# Patient Record
Sex: Male | Born: 1935 | Race: White | Hispanic: No | Marital: Married | Smoking: Never smoker
Health system: Southern US, Community
[De-identification: ages and names within clinical notes are randomized; demographics above are authoritative.]

## PROBLEM LIST (undated history)

## (undated) DIAGNOSIS — E785 Hyperlipidemia, unspecified: Secondary | ICD-10-CM

## (undated) DIAGNOSIS — I2109 ST elevation (STEMI) myocardial infarction involving other coronary artery of anterior wall: Secondary | ICD-10-CM

## (undated) DIAGNOSIS — E669 Obesity, unspecified: Secondary | ICD-10-CM

## (undated) DIAGNOSIS — G4733 Obstructive sleep apnea (adult) (pediatric): Secondary | ICD-10-CM

## (undated) DIAGNOSIS — E7800 Pure hypercholesterolemia, unspecified: Secondary | ICD-10-CM

## (undated) DIAGNOSIS — I10 Essential (primary) hypertension: Secondary | ICD-10-CM

## (undated) DIAGNOSIS — I2510 Atherosclerotic heart disease of native coronary artery without angina pectoris: Secondary | ICD-10-CM

## (undated) DIAGNOSIS — I872 Venous insufficiency (chronic) (peripheral): Secondary | ICD-10-CM

## (undated) DIAGNOSIS — IMO0001 Reserved for inherently not codable concepts without codable children: Secondary | ICD-10-CM

## (undated) DIAGNOSIS — D496 Neoplasm of unspecified behavior of brain: Secondary | ICD-10-CM

## (undated) DIAGNOSIS — I252 Old myocardial infarction: Secondary | ICD-10-CM

## (undated) DIAGNOSIS — L97919 Non-pressure chronic ulcer of unspecified part of right lower leg with unspecified severity: Secondary | ICD-10-CM

## (undated) DIAGNOSIS — D649 Anemia, unspecified: Secondary | ICD-10-CM

## (undated) DIAGNOSIS — IMO0002 Reserved for concepts with insufficient information to code with codable children: Secondary | ICD-10-CM

## (undated) HISTORY — DX: Neoplasm of unspecified behavior of brain: D49.6

## (undated) HISTORY — PX: TONSILLECTOMY AND ADENOIDECTOMY: SUR1326

## (undated) HISTORY — DX: Anemia, unspecified: D64.9

## (undated) HISTORY — DX: Reserved for concepts with insufficient information to code with codable children: IMO0002

## (undated) HISTORY — DX: Essential (primary) hypertension: I10

## (undated) HISTORY — DX: Atherosclerotic heart disease of native coronary artery without angina pectoris: I25.10

## (undated) HISTORY — DX: Non-pressure chronic ulcer of unspecified part of right lower leg with unspecified severity: L97.919

## (undated) HISTORY — DX: Reserved for inherently not codable concepts without codable children: IMO0001

## (undated) HISTORY — DX: Venous insufficiency (chronic) (peripheral): I87.2

## (undated) HISTORY — DX: Old myocardial infarction: I25.2

## (undated) HISTORY — DX: Obstructive sleep apnea (adult) (pediatric): G47.33

## (undated) HISTORY — DX: Obesity, unspecified: E66.9

## (undated) HISTORY — DX: ST elevation (STEMI) myocardial infarction involving other coronary artery of anterior wall: I21.09

## (undated) HISTORY — DX: Hyperlipidemia, unspecified: E78.5

## (undated) HISTORY — DX: Pure hypercholesterolemia, unspecified: E78.00

---

## 1988-12-05 DIAGNOSIS — I2109 ST elevation (STEMI) myocardial infarction involving other coronary artery of anterior wall: Secondary | ICD-10-CM

## 1988-12-05 HISTORY — DX: ST elevation (STEMI) myocardial infarction involving other coronary artery of anterior wall: I21.09

## 1998-11-06 ENCOUNTER — Ambulatory Visit: Admission: RE | Admit: 1998-11-06 | Discharge: 1998-11-06 | Payer: Self-pay | Admitting: Cardiology

## 2002-04-24 ENCOUNTER — Encounter: Admission: RE | Admit: 2002-04-24 | Discharge: 2002-04-24 | Payer: Self-pay | Admitting: Internal Medicine

## 2002-04-24 ENCOUNTER — Encounter: Payer: Self-pay | Admitting: Internal Medicine

## 2003-01-05 HISTORY — PX: CORONARY ARTERY BYPASS GRAFT: SHX141

## 2003-01-21 ENCOUNTER — Ambulatory Visit (HOSPITAL_COMMUNITY): Admission: RE | Admit: 2003-01-21 | Discharge: 2003-01-21 | Payer: Self-pay | Admitting: Cardiology

## 2003-01-27 ENCOUNTER — Encounter: Payer: Self-pay | Admitting: Thoracic Surgery (Cardiothoracic Vascular Surgery)

## 2003-01-30 ENCOUNTER — Encounter: Payer: Self-pay | Admitting: Thoracic Surgery (Cardiothoracic Vascular Surgery)

## 2003-01-30 ENCOUNTER — Inpatient Hospital Stay (HOSPITAL_COMMUNITY)
Admission: RE | Admit: 2003-01-30 | Discharge: 2003-02-04 | Payer: Self-pay | Admitting: Thoracic Surgery (Cardiothoracic Vascular Surgery)

## 2003-01-31 ENCOUNTER — Encounter: Payer: Self-pay | Admitting: Thoracic Surgery (Cardiothoracic Vascular Surgery)

## 2003-02-01 ENCOUNTER — Encounter: Payer: Self-pay | Admitting: Thoracic Surgery (Cardiothoracic Vascular Surgery)

## 2003-03-03 ENCOUNTER — Encounter (HOSPITAL_COMMUNITY): Admission: RE | Admit: 2003-03-03 | Discharge: 2003-06-01 | Payer: Self-pay | Admitting: Cardiology

## 2003-05-22 ENCOUNTER — Inpatient Hospital Stay (HOSPITAL_COMMUNITY): Admission: EM | Admit: 2003-05-22 | Discharge: 2003-05-25 | Payer: Self-pay | Admitting: Emergency Medicine

## 2003-05-22 ENCOUNTER — Encounter: Payer: Self-pay | Admitting: Emergency Medicine

## 2003-06-05 HISTORY — PX: CORONARY ANGIOPLASTY: SHX604

## 2003-06-16 ENCOUNTER — Encounter (HOSPITAL_COMMUNITY): Admission: RE | Admit: 2003-06-16 | Discharge: 2003-09-14 | Payer: Self-pay | Admitting: Cardiology

## 2003-06-28 ENCOUNTER — Inpatient Hospital Stay (HOSPITAL_COMMUNITY): Admission: EM | Admit: 2003-06-28 | Discharge: 2003-07-01 | Payer: Self-pay

## 2003-06-30 HISTORY — PX: CARDIAC CATHETERIZATION: SHX172

## 2003-07-15 ENCOUNTER — Ambulatory Visit (HOSPITAL_COMMUNITY): Admission: RE | Admit: 2003-07-15 | Discharge: 2003-07-16 | Payer: Self-pay | Admitting: Cardiology

## 2003-12-25 ENCOUNTER — Ambulatory Visit (HOSPITAL_COMMUNITY): Admission: RE | Admit: 2003-12-25 | Discharge: 2003-12-25 | Payer: Self-pay | Admitting: Gastroenterology

## 2008-05-01 ENCOUNTER — Encounter: Admission: RE | Admit: 2008-05-01 | Discharge: 2008-05-01 | Payer: Self-pay | Admitting: Internal Medicine

## 2009-01-23 HISTORY — PX: CARDIOVASCULAR STRESS TEST: SHX262

## 2010-07-26 ENCOUNTER — Ambulatory Visit: Payer: Self-pay | Admitting: Cardiology

## 2010-12-15 ENCOUNTER — Encounter: Payer: Self-pay | Admitting: Gastroenterology

## 2011-01-03 ENCOUNTER — Encounter (INDEPENDENT_AMBULATORY_CARE_PROVIDER_SITE_OTHER): Payer: Self-pay | Admitting: *Deleted

## 2011-01-06 NOTE — Letter (Signed)
Summary: Colonoscopy Letter  [facility]  [address] [place], [place] [address]   Phone: [phone]  Fax: [phone]      December 15, 2010 MRN: [id number]   [name] [address] [place], [place]  [address]   Dear [name],   According to your medical record, it is time for you to schedule a Colonoscopy. The American Cancer Society recommends this procedure as a method to detect early colon cancer. Patients with a family history of colon cancer, or a personal history of colon polyps or inflammatory bowel disease are at increased risk.  This letter has been generated based on the recommendations made at the time of your procedure. If you feel that in your particular situation this may no longer apply, please contact our office.  Please call our office at [phone] to schedule this appointment or to update your records at your earliest convenience.  Thank you for cooperating with [place] to provide you with the very best care possible.   Sincerely,  Judie Petit T. Russella Dar, M.D.  [facility] Gastroenterology Division [phone]

## 2011-01-12 NOTE — Letter (Signed)
Summary: Pre Visit Letter Revised  [facility]  [address] [place], [place] [address]   Phone: [phone]  Fax: [phone]        01/03/2011 MRN: [id number] [name] [address] [place], [place]  [address]             Procedure Date: February 07, 2011   recall col-Dr Annamarie Major to the Gastroenterology Division at [facility].    You are scheduled to see a nurse for your pre-procedure visit on January 24, 2011 at 9:00am on the 3rd floor at [facility], [address]  We ask that you try to arrive at our office 15 minutes prior to your appointment time to allow for check-in.  Please take a minute to review the attached form.  If you answer "Yes" to one or more of the questions on the first page, we ask that you call the person listed at your earliest opportunity.  If you answer "No" to all of the questions, please complete the rest of the form and bring it to your appointment.    Your nurse visit will consist of discussing your medical and surgical history, your immediate family medical history, and your medications.   If you are unable to list all of your medications on the form, please bring the medication bottles to your appointment and we will list them.  We will need to be aware of both prescribed and over the counter drugs.  We will need to know exact dosage information as well.    Please be prepared to read and sign documents such as consent forms, a financial agreement, and acknowledgement forms.  If necessary, and with your consent, a friend or relative is welcome to sit-in on the nurse visit with you.  Please bring your insurance card so that we may make a copy of it.  If your insurance requires a referral to see a specialist, please bring your referral form from your primary care physician.  No co-pay is required for this nurse visit.     If you cannot keep your appointment, please call [phone] to cancel or reschedule prior to  your appointment date.  This allows [place] the opportunity to schedule an appointment for another patient in need of care.    Thank you for choosing [facility] for your medical needs.  We appreciate the opportunity to care for you.  Please visit [place] at our website  to learn more about our practice.  Sincerely, The Gastroenterology Division

## 2011-01-20 ENCOUNTER — Encounter (INDEPENDENT_AMBULATORY_CARE_PROVIDER_SITE_OTHER): Payer: Self-pay | Admitting: *Deleted

## 2011-01-24 ENCOUNTER — Encounter: Payer: Self-pay | Admitting: Gastroenterology

## 2011-01-28 ENCOUNTER — Ambulatory Visit (INDEPENDENT_AMBULATORY_CARE_PROVIDER_SITE_OTHER): Payer: Medicare Other | Admitting: Cardiology

## 2011-01-28 DIAGNOSIS — E7800 Pure hypercholesterolemia, unspecified: Secondary | ICD-10-CM

## 2011-01-28 DIAGNOSIS — I252 Old myocardial infarction: Secondary | ICD-10-CM

## 2011-01-28 DIAGNOSIS — I119 Hypertensive heart disease without heart failure: Secondary | ICD-10-CM

## 2011-01-28 DIAGNOSIS — I2510 Atherosclerotic heart disease of native coronary artery without angina pectoris: Secondary | ICD-10-CM

## 2011-02-01 NOTE — Miscellaneous (Signed)
Summary: [facility] PV  Clinical Lists Changes  Medications: Added new medication of MOVIPREP 100 GM  SOLR (PEG-KCL-NACL-NASULF-NA ASC-C) As per prep instructions. - Signed Rx of MOVIPREP 100 GM  SOLR (PEG-KCL-NACL-NASULF-NA ASC-C) As per prep instructions.;  #1 x 0;  Signed;  Entered by: Ezra Sites RN;  Authorized by: Meryl Dare MD [facility];  Method used: Electronically to [facility] [address], [place], [place]  [address], Ph: [phone], Fax: [phone] Observations: Added new observation of NKA: T (01/24/2011 8:44)    Prescriptions: MOVIPREP 100 GM  SOLR (PEG-KCL-NACL-NASULF-NA ASC-C) As per prep instructions.  #1 x 0   Entered by:   Ezra Sites RN   Authorized by:   Meryl Dare MD [facility]   Signed by:   Ezra Sites RN on 01/24/2011   Method used:   Electronically to        [facility] (retail)       [address]       [place], [place]  [address]       Ph: [phone]       Fax: [phone]   RxID:   [id number]

## 2011-02-01 NOTE — Letter (Signed)
Summary: Moviprep Instructions  [facility]  [address]   [place], [place] [address]   Phone: [phone]  Fax: [phone]       [name]    07-28-1935    MRN: [id number]        Procedure Day Dorna Bloom: Monday, 02-07-11     Arrival Time: 9:30 a.m.     Procedure Time: 10:30 a.m.     Location of Procedure:                    x  [facility] (4th Floor)   PREPARATION FOR COLONOSCOPY WITH MOVIPREP   Starting 5 days prior to your procedure 02-02-11 do not eat nuts, seeds, popcorn, corn, beans, peas,  salads, or any raw vegetables.  Do not take any fiber supplements (e.g. Metamucil, Citrucel, and Benefiber).  THE DAY BEFORE YOUR PROCEDURE         DATE: 02-06-11  DAY: Sunday  1.  Drink clear liquids the entire day-NO SOLID FOOD  2.  Do not drink anything colored red or purple.  Avoid juices with pulp.  No orange juice.  3.  Drink at least 64 oz. (8 glasses) of fluid/clear liquids during the day to prevent dehydration and help the prep work efficiently.  CLEAR LIQUIDS INCLUDE: Water Jello Ice Popsicles Tea (sugar ok, no milk/cream) Powdered fruit flavored drinks Coffee (sugar ok, no milk/cream) Gatorade Juice: apple, white grape, white cranberry  Lemonade Clear bullion, consomm, broth Carbonated beverages (any kind) Strained chicken noodle soup Hard Candy                             4.  In the morning, mix first dose of MoviPrep solution:    Empty 1 Pouch A and 1 Pouch B into the disposable container    Add lukewarm drinking water to the top line of the container. Mix to dissolve    Refrigerate (mixed solution should be used within 24 hrs)  5.  Begin drinking the prep at 5:00 p.m. The MoviPrep container is divided by 4 marks.   Every 15 minutes drink the solution down to the next mark (approximately 8 oz) until the full liter is complete.   6.  Follow completed prep with 16 oz of clear liquid of your choice (Nothing red or purple).   Continue to drink clear liquids until bedtime.  7.  Before going to bed, mix second dose of MoviPrep solution:    Empty 1 Pouch A and 1 Pouch B into the disposable container    Add lukewarm drinking water to the top line of the container. Mix to dissolve    Refrigerate  THE DAY OF YOUR PROCEDURE      DATE: 02-07-11 DAY: Monday  Beginning at 5:30 a.m. (5 hours before procedure):         1. Every 15 minutes, drink the solution down to the next mark (approx 8 oz) until the full liter is complete.  2. Follow completed prep with 16 oz. of clear liquid of your choice.    3. You may drink clear liquids until 8:30 a.m. (2 HOURS BEFORE PROCEDURE).   MEDICATION INSTRUCTIONS  Unless otherwise instructed, you should take regular prescription medications with a small sip of water   as early as possible the morning of your procedure.           OTHER INSTRUCTIONS  You will need a responsible adult at  least 74 years of age to accompany you and drive you home.   This person must remain in the waiting room during your procedure.  Wear loose fitting clothing that is easily removed.  Leave jewelry and other valuables at home.  However, you may wish to bring a book to read or  an iPod/MP3 player to listen to music as you wait for your procedure to start.  Remove all body piercing jewelry and leave at home.  Total time from sign-in until discharge is approximately 2-3 hours.  You should go home directly after your procedure and rest.  You can resume normal activities the  day after your procedure.  The day of your procedure you should not:   Drive   Make legal decisions   Operate machinery   Drink alcohol   Return to work  You will receive specific instructions about eating, activities and medications before you leave.    The above instructions have been reviewed and explained to me by   Ezra Sites RN  January 24, 2011 9:27 AM     I fully understand and can verbalize  these instructions _____________________________ Date _________

## 2011-02-07 ENCOUNTER — Other Ambulatory Visit (AMBULATORY_SURGERY_CENTER): Payer: Medicare Other | Admitting: Gastroenterology

## 2011-02-07 ENCOUNTER — Other Ambulatory Visit: Payer: Self-pay | Admitting: Gastroenterology

## 2011-02-07 DIAGNOSIS — D128 Benign neoplasm of rectum: Secondary | ICD-10-CM

## 2011-02-07 DIAGNOSIS — K648 Other hemorrhoids: Secondary | ICD-10-CM

## 2011-02-07 DIAGNOSIS — K5730 Diverticulosis of large intestine without perforation or abscess without bleeding: Secondary | ICD-10-CM

## 2011-02-07 DIAGNOSIS — Z1211 Encounter for screening for malignant neoplasm of colon: Secondary | ICD-10-CM

## 2011-02-07 DIAGNOSIS — D129 Benign neoplasm of anus and anal canal: Secondary | ICD-10-CM

## 2011-02-07 DIAGNOSIS — K620 Anal polyp: Secondary | ICD-10-CM

## 2011-02-10 ENCOUNTER — Encounter: Payer: Self-pay | Admitting: Gastroenterology

## 2011-02-15 NOTE — Procedures (Addendum)
Summary: Colonoscopy  Patient: [name] Note: All result statuses are Final unless otherwise noted.  Tests: (1) Colonoscopy (COL)   COL Colonoscopy           DONE     [facility]     [address]     [place], [place]  [address]          COLONOSCOPY PROCEDURE REPORT     PATIENT:  Todd, [name]  MR#:  [id number]     BIRTHDATE:  1935/01/22, 75 yrs. old  GENDER:  male     ENDOSCOPIST:  Judie Petit T. Russella Dar, MD, [facility]          PROCEDURE DATE:  02/07/2011     PROCEDURE:  Colonoscopy with biopsy     ASA CLASS:  Class II     INDICATIONS:  1) Routine Risk Screening     MEDICATIONS:   Fentanyl 50 mcg IV, Versed 4 mg IV     DESCRIPTION OF PROCEDURE:   After the risks benefits and     alternatives of the procedure were thoroughly explained, informed     consent was obtained.  Digital rectal exam was performed and     revealed no abnormalities.   The LB PCF-H180AL [id number] endoscope     was introduced through the anus and advanced to the cecum, which     was identified by both the appendix and ileocecal valve, without     limitations.  The quality of the prep was good, using MoviPrep.     The instrument was then slowly withdrawn as the colon was fully     examined.     <<PROCEDUREIMAGES>>     FINDINGS:  A sessile polyp was found in the rectum. It was 4 mm in     size. The polyp was removed using cold biopsy forceps. Moderate     diverticulosis was found in the sigmoid colon.  A normal appearing     cecum, ileocecal valve, and appendiceal orifice were identified.     The ascending, hepatic flexure, transverse, splenic flexure,     descending appeared unremarkable. Retroflexed views in the rectum     revealed internal hemorrhoids, small.  The time to cecum =  3.25     minutes. The scope was then withdrawn (time =  10.25  min) from the     patient and the procedure completed.          COMPLICATIONS:  None          ENDOSCOPIC IMPRESSION:     1) 4 mm sessile polyp in the  rectum     2) Moderate diverticulosis in the sigmoid colon     3) Internal hemorrhoids          RECOMMENDATIONS:     1) Await pathology results     2) High fiber diet with liberal fluid intake.     3) If the polyp is adenomatous (pre-cancerous), colonoscopy in 5     years. Otherwise follow colorectal cancer screening guidelines for     "routine risk" patients with colonoscopy in 10 years.          Venita Lick. Russella Dar, MD, [facility]          CC:  Rodrigo Ran, MD          n.     Rosalie DoctorVenita Lick. Enna Warwick at 02/07/2011 11:08 AM          Nehemiah Massed, [phone]  Note:  An exclamation mark (!) indicates a result that was not dispersed into the flowsheet. Document Creation Date: 02/07/2011 11:08 AM _______________________________________________________________________  (1) Order result status: Final Collection or observation date-time: 02/07/2011 11:04 Requested date-time:  Receipt date-time:  Reported date-time:  Referring Physician:   Ordering Physician: Claudette Head [phone]) Specimen Source:  Source: Launa Grill Order Number: [id number] Lab site:   Appended Document: Colonoscopy     Procedures Next Due Date:    Colonoscopy: 02/2021

## 2011-02-22 NOTE — Letter (Signed)
Summary: Patient Notice- Colon Biospy Results   Gastroenterology  [address] [place], [place] [address]   Phone: [phone]  Fax: [phone]        February 10, 2011 MRN: [id number]    [name] [address] [place], [place]  [address]    Dear [name],  I am pleased to inform you that the biopsies taken during your recent colonoscopy did not show any evidence of cancer upon pathologic examination. The biopsies showed normal lymphoid tissue. There was no polyp.  Continue with the treatment plan as outlined on the day of your      exam.  You should have a repeat colonoscopy examination in 10 years.  Please call us if you are having persistent problems or have questions about your condition that have not been fully answered at this time.  Sincerely,  Meryl Dare MD [facility]  This letter has been electronically signed by your physician.  Appended Document: Patient Notice- Colon Biospy Results letter mailed

## 2011-04-22 NOTE — Op Note (Signed)
[name], [name] NO.:  [id number]   MEDICAL RECORD NO.:  [id number]                   PATIENT TYPE:  INP   LOCATION:  2304                                 FACILITY:  [facility]   PHYSICIAN:  Salvatore Decent. Dorris Fetch, M.D.         DATE OF BIRTH:  1935-03-30   DATE OF PROCEDURE:  01/30/2003  DATE OF DISCHARGE:                                 OPERATIVE REPORT   PREOPERATIVE DIAGNOSIS:  Severe three-vessel coronary artery disease.   POSTOPERATIVE DIAGNOSIS:  Severe three-vessel coronary artery disease.   PROCEDURE:  1. Median sternotomy.  2. Extracorporeal circulation.  3. Coronary artery bypass grafting x6 (LIMA to LAD, left radial artery to OM-     1, sequential saphenous vein graft to diagonal 1 and 2, sequential     saphenous vein grafts to posterior descending and posterolateral).  4. Endoscopic vein harvest at left thigh.   SURGEON:  Salvatore Decent. Dorris Fetch, M.D.   ASSISTANT:  Maple Mirza, P.A.   ANESTHESIA:  General.   FINDINGS:  Vein of fair quality with mild varicosities, large caliber,  mainly in radial, excellent quality, good targets.  OM-2 small, diffusely  diseased, and not graftable.   CLINICAL NOTE:  [name] is a 76 year old gentleman who underwent cardiac  catheterization by Dr. Peter Swaziland.  He was found to have severe three-  vessel coronary disease and was referred for consideration for coronary  artery bypass grafting.  The indications, risks, benefits, and alternative  treatments were discussed in detail with the patient preoperatively.  He  understood and accepted the risks of surgery and agreed to proceed.  Please  refer to the dictated history and physical for details of the patient's  history.   OPERATIVE NOTE:  [name] was brought to the preop holding area on  01/30/2003.  Lines were placed to monitor arterial, central venous and  pulmonary arterial pressure.  EKG leads were placed for venous telemetry.  Intravenous antibiotics were administered.  The patient was taken to the  operating room, anesthetized and intubated.  A Foley catheter was placed.  The left arm, chest, abdomen, and legs were prepped and draped in the usual  sterile fashion.   The left radial artery was harvested.  An incision was made on the volar  aspect overlying the radial pulse approximately 10 cm in length initially.  The distal end of the radial artery was dissected free using a harmonic  scalpel.  Large branches were clipped and divided with scissors.  After  freeing up the radial artery, a soft bulldog clamp was placed across the  radial artery.  There was a good pulse palpable distal to the bulldog clamp.  An incision was made, and we then proceeded with harvest.  The incision was  extended to just below the elbow crease.  The radial artery then was  harvested using the harmonic scalpel.  The radial artery was of big caliber.  When it was divided distally, there was excellent flow through the vessel.  It was 2.5 mm in diameter.  The vessel was irrigated with papaverine.  All  side branches were clipped.  The artery then was divided proximally.  Both  the proximal and distal stumps were suture ligated with a 4-0 Prolene  suture.  The wound then was irrigated and was closed in two layers with a  running 2-0 Vicryl subcutaneous suture and a running 3-0 Vicryl subcuticular  suture.  Simultaneously with this, an incision was made in the medial aspect  of the left leg at the level of the knee and the greater saphenous vein was  harvested from the left thigh using endoscopic technique.  The saphenous  vein was of fair quality.  It did have varicosities but was of large caliber  and was adequate for grafting.   The median sternotomy was performed, and the left internal mammary artery  was harvested in the standard fashion.  Branches were doubly clipped and  divided with scissors.  The mammary was a large-caliber,  good-quality  vessel.  The patient was fully heparinized, vessel divided distally; and in  the mammary artery, there was excellent flow through the cut end of the  vessel.   The pericardium was opened.  The ascending aorta was inspected and palpated.  There was no palpable atherosclerotic disease.  The aorta was cannulated  with a 24-French aortic cannula.  Via concentric #2 Ethibond pursestring  sutures, a dual-stage venous cannula was placed via pursestring suture in  the right atrial appendage.  Cardiopulmonary bypass was instituted, and the  patient was cooled to 32 degrees Celsius.  The coronary arteries were  inspected, and the anastomotic sites were chosen.  The conduits were  inspected and cut to length.  The radial artery was not of sufficient length  to graft the posterior descending and posterior lateral branches  sequentially.  Therefore, vein was used for this; and the radial artery was  used for OM-1.  OM-2 was non-graftable.  A foam pad was placed in the  pericardium to insulate the heart and protect the left phrenic nerve.  A  temperature probe was placed in the myocardial septum, and a cardioplegia  catheter was placed in the ascending aorta.   The aorta was cross clamped.  The left ventricle was entered via the aortic  root vent, and cardiac arrest was achieved with a combination of cold  antegrade blood cardioplegia and topical ice saline.  After achieving a  complete diastolic arrest and myocardial septal temperature of 11 degrees  Celsius, the following distal anastomoses were performed.   First, a reversed saphenous vein graft was placed sequentially to the  posterior descending and posterior lateral branches of the right coronary  artery.  This vein was of fair quality.  The posterior descending and  posterior lateral were both good-quality targets.  The PDA was 1.5 and the  PL was 2 mm in diameter.  A side-to-side anastomosis was performed at the posterior  descending and end-to-side at the posterior lateral.  Both were  performed with running 7-0 Prolene sutures.  There was excellent flow  through the graft.  Cardioplegia was administered.  There was good  hemostasis.  Septal re-warming occurred during this anastomosis.  Additional  cardioplegia was administered via the aortic root as well.   Next, a reversed saphenous vein graft was placed sequentially to the first  and second branches of the large diagonal.  The vein  again was of large  caliber and fair quality.  Both diagonal vessels were 1.5 mm in diameter.  A  side-to-side anastomosis was performed to the first diagonal and an end to  side to the second diagonal.  Both were performed with running 7-0 Prolene  sutures.  Both anastomoses probed easily proximally and distally with a 1.5-  mm probe placed in the anastomosis.  Again, there was excellent flow through  this graft.  Cardioplegia was administered through the vein grafts.   Next, the left radial artery was spatulated at its distal end and  anastomosed end to side to the first obtuse marginal vein to the left  circumflex.  This was a 1.5-mm, good-quality target.  The radial artery was  of good quality.  The anastomosis was performed with a running 8-0 Prolene  suture.  There was excellent flow through the graft.  Cardioplegia was  administered down the aortic root.  There was good back bleeding from the  radial artery.   Next, the left internal mammary artery was brought through a window in the  pericardium.  The distal end of the mammary was spatulated.  It was  anastomosed end to side to the distal LAD.  Both the LAD and the mammary  were 2-mm, good-quality vessels.  The anastomosis was performed end to side  with a running 8-0 Prolene suture.  At the mammary to LAD anastomosis, the  bulldog clamp was briefly removed to inspect for hemostasis.  There was an  excellent flash of blood distally.  Septal re-warming was noted.   The  bulldog clamp was re-placed.  The mammary pedicle was tacked at the  epicardial surface of the heart with 6-0 Prolene sutures.  Additional  cardioplegia was administered, and the vein grafts were cut to length.   The cardioplegia cannula was removed from the ascending aorta.  While under  cross clamp, the proximal anastomoses were performed.  The radial proximal  was performed to a 4-mm punch aortotomy with a running 7-0 Prolene suture.  The vein graft proximal anastomoses were performed with 4.4-mm punch  aortotomies with running 6-0 Prolene sutures.  At the completion of the  final proximal anastomosis, the patient was placed in the Trendelenburg  position.  The bulldog clamp was again removed from the mammary artery.  Immediate septal re-warming was noted.  Lidocaine was administered.  The  aortic root was de-aired.  The aortic cross clamp was removed.  The total  cross-clamp time was 107 minutes.  The patient required a single defibrillation with 20 joules.  He then was in  sinus rhythm thereafter.  The proximal and distal anastomoses were inspected  for hemostasis.  Epicardial pacing wires were placed on the right ventricle  and right atrium.  When the patient had been re-warmed to a core temperature  of 37 degrees Celsius, he was weaned from cardiopulmonary bypass without  difficulty.  The lungs were initially inflated, and the heart was allowed to  gradually fill with blood as the pump flows were decreased; and as stated,  the patient weaned without difficulty.  His initial cardiac index was  approximately 1.5 liters per minute per meter squared.  With volume  administration, it quickly rose to greater than 2 liters per minute per  meter squared, and the patient remained hemodynamically stable throughout  the post-bypass period.   A test dose of protamine was administered and was well tolerated.  The  atrial and aortic cannulae were removed.  The  remainder of the protamine  was  administered without incident.  The chest was irrigated with one liter of  warm normal saline containing 1 gram of vancomycin.  Hemostasis was  achieved.  A left pleural and two mediastinal chest tubes were placed  through separate subcostal incisions.  The pericardium was reapproximated  with interrupted 3-0 silk sutures and came together easily without tension.  The sternum was closed with heavy-gauge stainless steel wires.  The  pectoralis fascia was closed with a running #1 Vicryl suture.  The  subcutaneous tissue and skin were closed in standard fashion.  All sponge,  needle and instrument counts were correct at the end of the procedure.  There were no intraoperative complications.  The patient was taken from the  operating room to the Surgical Intensive Care Unit intubated and in critical  but stable condition.                                                 Salvatore Decent Dorris Fetch, M.D.    SCH/MEDQ  D:  01/30/2003  T:  01/31/2003  Job:  [id number]   cc:   Peter M. Swaziland, M.D.  [address]  [place], [place] [address]  Fax: [phone]

## 2011-04-22 NOTE — Cardiovascular Report (Signed)
NAME:  MALEK, SKOG NO.:  [id number]   MEDICAL RECORD NO.:  [id number]                   PATIENT TYPE:  OIB   LOCATION:  2852                                 FACILITY:  [facility]   PHYSICIAN:  Peter M. Swaziland, M.D.               DATE OF BIRTH:  1935/07/18   DATE OF PROCEDURE:  01/21/2003  DATE OF DISCHARGE:                              CARDIAC CATHETERIZATION   INDICATIONS FOR PROCEDURE:  The patient is a 76 year old white male, status  post remote anterior myocardial infarction in 1990 and subsequent  angioplasty of the LAD, who presents now with increasing anginal symptoms.  The patient has a history of hyperlipidemia and hypertension.   ACCESS:  Via the right femoral artery using the standard Seldinger  technique.   EQUIPMENT:  The 6 French 4 cm right and left Judkins catheter, 6 French  pigtail catheter, 6 French arterial sheath.   MEDICATIONS:  Local anesthesia with 1% Xylocaine.   CONTRAST:  Omnipaque 150 mL.   HEMODYNAMIC DATA:  Aortic pressure was 100/64 with a mean of 80 mmHg.  Left  ventricular pressure is 107 with an EDP of 23 mmHg.   ANGIOGRAPHIC DATA:  1. Left coronary artery:  The left coronary artery arises and distributes     normally.  2. Left main:  The left main coronary artery is ectatic with mild narrowing     up to 20% distally.  3. Left anterior descending:  The left anterior descending artery is a large     vessel.  At the diagonal bifurcation there is a 70-80% stenosis in the     LAD.  This is followed by sequential 90% stenoses in the mid LAD.  The     far distal LAD at the apex is diffusely diseased to 90%.  The first     diagonal branch is a large vessel with 90% ostial stenosis.  4. Left circumflex:  The left circumflex coronary artery has a 99% stenosis     in the mid vessel prior to a very small second marginal branch.  The     first marginal branch is without significant disease.  5. Right coronary artery:  The  right coronary artery is a large dominant     vessel. It is also ectatic.  There is a very long segmental stenosis in     the mid vessel to 70%.  This covers approximately 30 mm of vessel. There     is also a 90% stenosis in a large posterolateral branch.   LEFT VENTRICULAR ANGIOGRAPHY:  The left ventricular angiography is performed  in the RAO and LAO cranial views.  This demonstrates akinesia of the mid to  distal septum and apex.  Overall, left ventricular systolic function is  mildly impaired with ejection fraction estimated at 45%.  There is no mitral  regurgitation or prolapse.  The aortic valve appears normal.  FINAL INTERPRETATION:  1. Severe three-vessel obstructive atherosclerotic coronary artery disease.  2. Mild left ventricular dysfunction.   PLAN:  Would recommend coronary bypass surgery.                                                   Peter M. Swaziland, M.D.    PMJ/MEDQ  D:  01/21/2003  T:  01/21/2003  Job:  [id number]   cc:   Loraine Leriche A. Waynard Edwards, M.D.  [address]  [place]  [place] [address]  Fax: [phone]   CVTS

## 2011-04-22 NOTE — Discharge Summary (Signed)
NAMESOLAN, VOSLER NO.:  [id number]   MEDICAL RECORD NO.:  [id number]                   PATIENT TYPE:  INP   LOCATION:  2018                                 FACILITY:  [facility]   PHYSICIAN:  Peter M. Swaziland, M.D.               DATE OF BIRTH:  [birth year]   DATE OF ADMISSION:  05/22/2003  DATE OF DISCHARGE:  05/25/2003                                 DISCHARGE SUMMARY   HISTORY OF PRESENT ILLNESS:  The patient is a 76 year old white male with a  history of coronary artery disease, status post anterior myocardial  infarction in 1990.  He underwent coronary artery bypass graft surgery x6 in  February of 2004.  He presented with abrupt onset of chest pain and EKG  findings of acute inferior myocardial infarction.  For details of his past  medical history, social history, family history, and physical examination,  please see admission history and physical.   LABORATORY DATA:  EKG showed normal sinus rhythm with evidence of old  anterior myocardial infarction. He had new ST elevation of 1 mm in leads 2,  3, and aVF as well as in V6. His chest x-ray showed cardiomegaly with no  active disease.   His pH of 7.40, pCO2 36, bicarb 23.  Hemoglobin was 11.9, hematocrit 35,  white count 6700, platelets 255,000. Sodium 140, potassium 3.5, chloride  110, CO2 26, glucose 111, BUN 11, creatinine 1.0. CK was initially 51 with  1.6 MB and troponin is less than 0.01.  Cholesterol 100, triglycerides 54,  LDL 58, and HDL 31.   HOSPITAL COURSE:  The patient was treated initially in the emergency room  with IV heparin, IV nitroglycerin.  He was taken emergently to the cardiac  catheterization lab. This demonstrated severe native three vessel coronary  artery disease.  The LIMA graft to the LAD was patent. The free radial  artery graft to the obtuse marginal branch was patent.  The saphenous vein  graft to the first and second diagonal branches were occluded proximally.  The diagonal did have some antegrade flow through the native vessel with a  90% ostial stenosis.  The saphenous vein graft to the PDA and posterolateral  branch had a severe 90% stenosis in the body of the vein graft. It was then  occluded after the PDA in the continuation to the posterolateral branch.  This was the infarct-related vessel.  Left ventricular angiography showed  apical akinesia with mild inferior hypokinesia and overall ejection fraction  of approximately 45%.  The patient underwent emergent stenting of the body  of the vein graft to the PDA. A 3.5 x 33 mm Sypher stent was used for that  lesion.  He underwent angioplasty of the continuation of the vein graft to  the posterolateral branch up to a 2.5 mm balloon with a good angiographic  result and restoration of TIMI 3 flow down the  posterolateral branch.  Following this procedure, he was maintained on IV nitroglycerin and IV  Integrelin. He was treated with Plavix and aspirin. He had resolution of his  chest pain, resolution of his ST segment elevation on EKG.  Subsequent CPK  rose to 841 with 158.1 MB followed by 299 with 39.3 MB.  The patient did  very well throughout the remainder of his hospital stay with no recurrent  angina and no arrhythmias and stable blood pressure control.  In review of  his findings, it was felt that he had early vein graft degeneration,  probably due to the fact that he had poor veins to select from at the time  of his surgery with marked venous varicosities.  It was elected to discharge  him at this point. I would consider repeat angiography in four to six weeks  in consideration of stenting of the ostial diagonal lesion if he remains  stable.   DISCHARGE DIAGNOSES:  1. Acute inferior myocardial infarction.  2. Status post coronary artery bypass graft with early vein graft     degeneration.  3. Remote anterior myocardial infarction.  4. Hypertension.  5. Dyslipidemia.  6. Chronic anemia.    DISCHARGE MEDICATIONS:  The patient is to resume his prior medications which  include aspirin daily, vitamin E 400 INTERNATIONAL UNITS daily, Omega 3 fish  oil daily, coral calcium supplement daily, Zocor 40 mg a day, multivitamin  daily, vitamin B6 daily, vitamin C daily, metoprolol 50 mg per day, and  Plavix 75 mg per day.   He is to perform light activity over the next two weeks.  He is not to drive  for one week.  Resume phase II cardiac rehabilitation in two weeks.   FOLLOW UP:  Will follow up with Dr. Swaziland in 10 to 14 days.   CONDITION ON DISCHARGE:  Improved.                                               Peter M. Swaziland, M.D.    PMJ/MEDQ  D:  05/25/2003  T:  05/26/2003  Job:  [id number]   cc:   Loraine Leriche A. Waynard Edwards, M.D.  [address]  [place]  [place] [address]  Fax: [phone]   Salvatore Decent. Dorris Fetch, M.D.  [address]  [place]  [place] [address]  Fax: [phone]    cc:   Redge Gainer. Waynard Edwards, M.D.  [address]  [place]  [place] [address]  Fax: [phone]   Salvatore Decent. Dorris Fetch, M.D.  [address]  [place]  [place] [address]  Fax: [phone]

## 2011-04-22 NOTE — Cardiovascular Report (Signed)
NAMEELIZAH, [name] NO.:  [id number]   MEDICAL RECORD NO.:  [id number]                   PATIENT TYPE:  INP   LOCATION:  6525                                 FACILITY:  [facility]   PHYSICIAN:  Peter M. Swaziland, M.D.               DATE OF BIRTH:  1935/11/26   DATE OF PROCEDURE:  06/30/2003  DATE OF DISCHARGE:                              CARDIAC CATHETERIZATION   INDICATION FOR PROCEDURE:  The patient is a 76 year old white male who is  status post coronary artery bypass surgery in February 2004.  He presented  with an acute inferior myocardial infarction in June 2004, with documented  occlusion of vein grafts to the diagonal and occlusion of his posterolateral  branch of the right coronary artery.  There is also severe stenosis in the  vein graft to the PDA and posterolateral branches.  He underwent successful  stenting of the vein graft at that time and repeat angioplasty of the  posterolateral branch.  He did well until he returned with a small non-Q-  wave myocardial infarction this weekend.   ACCESS:  Via the right femoral artery using standard Seldinger technique.   EQUIPMENT:  6-French 4 cm right and left Judkins catheter, 6-French pigtail  catheter, 6-French IMA catheter, 7-French arterial sheath, 7-French  multipurpose A1 guide, 0.014 Luge wire, a 2 x 10 mm Maverick balloon, and a  2.5 x 10 mm cutting balloon.   HEMODYNAMIC DATA:  1. Aortic pressure was 104/62 with a mean of 82.  2. Left ventricular pressure was 103 with an EDP of 27.   CONTRAST:  245 mL of contrast.   MEDICATIONS:  Heparin 5000 units IV, Integrilin double bolus of 0.18 mg/kg  x2, followed by continuous infusion of 2.01 mcg/kg per minute.  Local  anesthesia 1% Xylocaine.   ANGIOGRAPHIC DATA:  1. The left coronary artery arises and distributes normally.  The left main     is ectatic without significant disease.  2. The LAD has sequential lesions in the proximal vessel  including a 90%     stenosis at the takeoff of the first diagonal followed by two sequential     95% stenoses after the first septal perforator and then is totally     occluded.  The first diagonal branch is a large branch which has a 90%     stenosis at the origin.  The left circumflex coronary artery is occluded     after the first marginal branch.  The first marginal branch has diffuse     50% stenosis proximally.  3. The right coronary artery has a long 70% stenosis from the proximal to     the mid vessel.  The posterior descending and posterolateral branches are     occluded proximally.  4. The saphenous vein graft to the diagonal is occluded.  5. The free radial graft to the first obtuse marginal  branch is widely     patent.  6. The right coronary artery to the PDA is patent.  The previously stented     segment is widely patent.  However, there is a 90% stenosis involving the     anastomosis with the PDA.  The posterolateral branch is now occluded.  7. The LIMA graft to the LAD is widely patent.  Inserts in the mid LAD.     There is severe segmental disease in the far distal LAD at the apex up to     90%.   LEFT VENTRICULOGRAPHY:  Left ventricular angiography was performed in the  RAO view.  This demonstrates a large ventricular chamber size.  There is  apical akinesia with inferior wall hypokinesia.  Overall left ventricular  systolic function is mildly depressed with ejection fraction estimated at  50%.  There is mild mitral insufficiency.   It was felt based on his angiographic data that his culprit lesion was the  PDA.  The posterolateral branch is now occluded, which had been opened  acutely at the time of his myocardial infarction.  We proceeded at this  point to try and restore better antegrade flow into the posterior descending  artery.  This lesion was easily crossed with a wire.  We predilated it with  a 2 mm Maverick, dilating up to 8 atm.  This was followed with the  2.5 mm  cutting balloon, performing 2 inflations at 6 atm.  This yielded an  excellent expansion in this segment of the vessel with less than 10%  residual stenosis and restoration of TIMI-3 flow to the PDA.   FINAL INTERPRETATION:  1. Severe 3-vessel obstructive atherosclerotic coronary artery disease.  2. Patent saphenous vein graft to the posterior descending artery but with     severe anastomotic stenosis.  3. Occluded saphenous vein graft to the diagonal.  4. Patent radial artery graft to the obtuse marginal 1.  5. Patent left internal mammary artery graft to the left anterior     descending.  6. Mild left ventricular dysfunction.  7. Successful cutting balloon angioplasty of the anastomosis of the vein     graft to the posterior descending artery.                                               Peter M. Swaziland, M.D.    PMJ/MEDQ  D:  06/30/2003  T:  07/01/2003  Job:  [id number]   cc:   Loraine Leriche A. Waynard Edwards, M.D.  [address]  [place]  [place] [address]  Fax: [phone]   Salvatore Decent. Dorris Fetch, M.D.  [address]  [place]  [place] [address]  Fax: [phone]

## 2011-04-22 NOTE — H&P (Signed)
[name], KOTCH NO.:  [id number]   MEDICAL RECORD NO.:  [id number]                   PATIENT TYPE:  EMS   LOCATION:  MAJO                                 FACILITY:  [facility]   PHYSICIAN:  Lesleigh Noe, M.D.            DATE OF BIRTH:  [birth year]   DATE OF ADMISSION:  06/28/2003  DATE OF DISCHARGE:                                HISTORY & PHYSICAL   REASON FOR ADMISSION:  Probable acute coronary syndrome, date June 28, 2003.   SUBJECTIVE:  Mr. Cullen is a very pleasant 76 year old gentleman with a  relatively recent history of coronary artery bypass grafting. His cardiac  history began in 1990 when he suffered an anterior myocardial infarction and  was treated with angioplasty. He began having symptoms again in early  January and subsequently underwent a six-vessel coronary bypass by Dr.  Viviann Spare C. Hendrickson with LIMA to the LAD, a free radial graft to the  obtuse marginal saphenous vein, sequential graft to the first and second  diagonal, and a sequential vein graft to the PDA and posterolateral branch.  He had done well until May 22, 2003 when he presented with acute onset of  arm pain and evidence by EKG of an acute inferior infarction. He was taken  to the catheterization lab where he underwent percutaneous intervention of  the saphenous vein graft to the right coronary which was recanalized with  percutaneous intervention. Since that time, the patient has done well.  Coincidental at the time of that catheterization, he was found to have total  occlusion of graft to the diagonals. This morning, he awakened with left arm  pain and came to the emergency room. He took several nitroglycerin tablets  and got relief by the time he arrived in the ER. Upon arrival in the ER,  however, discomfort was still persistent but gradually resolved.   SIGNIFICANT MEDICAL PROBLEMS:  1. Coronary atherosclerotic heart disease, previous anterior  infarction,     previous six-vessel coronary artery bypass grafting, previous saphenous     vein graft to the PDA and PL/OM May 22, 2003.  2. Hypertension.  3. Hyperlipidemia.  4. History of varicose veins.   ALLERGIES:  None.   MEDICATIONS:  1. Zocor 40 mg per day.  2. Niaspan 500 mg per day.  3. Metoprolol 25 mg b.i.d.  4. Plavix 75 mg per day.  5. Aspirin one per day.   SOCIAL HISTORY:  He is retired, has three children.   HABITS:  Does not smoke, does not drink.   FAMILY HISTORY:  Father and mother died in their older years. His mother  underwent coronary artery bypass grafting in her late 1s or early 40s.   REVIEW OF SYSTEMS:  Noncontributory. Please refer to prior H&P of May 22, 2003.   OBJECTIVE:  GENERAL:  The patient is in no acute distress.  SKIN:  Warm and dry.  VITAL SIGNS:  Blood pressure 110/70, heart rate 70, weight not obtained,  respirations 16 and nonlabored.  HEENT:  Reveals no xanthelasma or jaundice.  CHEST:  Clear.  CARDIAC:  No click, no rub, no murmur. S4 gallop is heard.  ABDOMEN:  Soft, no masses. Bowel sounds are normal.  EXTREMITIES:  Reveal no edema. Pulses are 2+ and symmetric upper and lower.  NEUROLOGICAL:  Normal.   EKG:  Reveals inferolateral Q-waves, no acute ST-T changes.   A point of care biomarker cardiac panel is negative x3. Chest x-ray:  Not  available.   ASSESSMENT:  Recurrent left arm pain mimicking the pain he has had with  previous acute coronary syndromes. No acute EKG changes are noted. He will  be admitted to the hospital as a probable acute coronary syndrome and  possible early percutaneous intervention graft failure.   PLAN:  1. Lovenox.  2. IV nitroglycerin.  3. Serial biomarkers for cardiac injury.  4. Repeat coronary angiography early next week.                                                Lesleigh Noe, M.D.    HWS/MEDQ  D:  06/28/2003  T:  06/28/2003  Job:  [id number]   cc:   Loraine Leriche A. Waynard Edwards,  M.D.  [address]  [place]  [place] [address]  Fax: [phone]   Peter M. Swaziland, M.D.  [address]  [place], [place] [address]  Fax: [phone]

## 2011-04-22 NOTE — Discharge Summary (Signed)
[name], [name] NO.:  [id number]   MEDICAL RECORD NO.:  [id number]                   PATIENT TYPE:  INP   LOCATION:  6525                                 FACILITY:  [facility]   PHYSICIAN:  Peter M. Swaziland, M.D.               DATE OF BIRTH:  10-20-1935   DATE OF ADMISSION:  06/28/2003  DATE OF DISCHARGE:  06/30/2003                                 DISCHARGE SUMMARY   HISTORY OF PRESENT ILLNESS:  Mr. [name] is a 76 year old white male with  history of coronary artery disease status post coronary artery bypass  surgery in February 2004.  He presented on May 22, 2003, with acute  inferior myocardial infarction.  At that time, the LIMA graft to the LAD and  radial graft to the obtuse marginal branches were patent.  Saphenous vein  graft to the large diagonal branch was occluded.  Saphenous vein graft to  the PDA and posterolateral branches had a severe stenosis in the body of the  vein graft and occlusion of the posterolateral branch.  The vein graft to  the right coronary artery was successfully stented, and the posterolateral  branch was re-perfused with angioplasty.  He had done well up until the  morning of admission when he developed recurrent left arm pain similar to  what he had with his heart attack.  This was relieved with nitroglycerin,  was not as severe as before.  He was admitted for further evaluation.   For details of his Past Medical History, Social History, Family History, and  Physical Examination, please see admission History and Physical.   LABORATORY DATA:  Chest x-ray shows cardiomegaly with bilateral atelectasis  and small effusion.   ECG showed normal sinus rhythm with changes of prior inferior infarction as  well as old anterior lateral infarction.  There were really no acute changes  noted.   The pH was 7.44, PCO 31.5, bicarb 23.  White count 5300, hemoglobin 11.5,  hematocrit 34, platelets 240,000.  PT 16, INR 1.4, PTT  35.  Sodium 140,  potassium 3.9, chloride 107, CO2 30, BUN 15, creatinine 1.1.  Glucose 106.  CK 101 with 15.6 MV, troponin 0.85.  Repeat CK 148, 25 MB and troponin 2.09.   HOSPITAL COURSE:  The patient was admitted and diagnosed with non-Q-wave  myocardial infarction.  He was treated with IV nitroglycerin and  subcutaneous Lovenox in addition to his usual medications.  He had no  recurrent chest pain.   On 06/30/2003, he underwent repeat cardiac catheterization.  This  demonstrated stable findings from his prior cardiac catheterization with the  exception of the posterolateral branch, and the right coronary artery was  occluded.  The saphenous vein graft to the PDA was patent, and the stented  segment still looked excellent.  However, there was a severe anastomotic  lesion to the PDA.  The LIMA and radial artery grafts  were still patent.  There was again noted severe disease with occlusion of the distal circumflex  supplied by collaterals.  There was also 90 to 95% stenosis in the far  distal LAD.  There was a large diagonal branch which had a severe 90%  stenosis at its origin.  Left ventricular function showed apical akinesia  with mild inferior hypokinesia, ejection fraction approximately 50%.   We proceeded with angioplasty of the anastomosis of the vein graft to the  PDA.  This was successfully dilated with 2.5 mm cutting balloon with an  excellent angiographic result and less than 10% residual stenosis.  He was  treated during and post procedure with IV Integrilin.  He had no further  chest pain.  An ECG remained stable.  He had no complications and was  discharged home in stable condition the following day.   DISCHARGE DIAGNOSES:  1. Non-Q-wave myocardial infarction.  2. Status post inferior myocardial infarction.  3. Status post remote anterior myocardial infarction.  4. Status post coronary artery bypass grafting in February 2004 with early     graft failure.  5.  Hyperlipidemia.  6. Hypertension.   DISCHARGE MEDICATIONS:  1. Aspirin 325 mg p.o. daily.  2. Plavix 75 mg daily.  3. Zocor 40 mg daily.  4. Niaspan 500 mg q.h.s.  5. Metoprolol 50 mg twice a day.   FOLLOW UP:  The patient will  have followup with Dr. Swaziland in one week.  Anticipate bringing him back in two to three weeks to attempt percutaneous  intervention of the diagonal branch.   CONDITION ON DISCHARGE:  Improved.                                                Peter M. Swaziland, M.D.    PMJ/MEDQ  D:  07/01/2003  T:  07/01/2003  Job:  [id number]   cc:   Loraine Leriche A. Waynard Edwards, M.D.  [address]  [place]  [place] [address]  Fax: [phone]   Salvatore Decent. Dorris Fetch, M.D.  [address]  [place]  [place] [address]  Fax: [phone]

## 2011-04-22 NOTE — H&P (Signed)
[name], [name] NO.:  [id number]   MEDICAL RECORD NO.:  [id number]                   PATIENT TYPE:  OIB   LOCATION:  NA                                   FACILITY:  [facility]   PHYSICIAN:  Peter M. Swaziland, M.D.               DATE OF BIRTH:  1935-05-26   DATE OF ADMISSION:  07/15/2003  DATE OF DISCHARGE:                                HISTORY & PHYSICAL   HISTORY OF PRESENT ILLNESS:  Briefly, [name] is a 76 year old white male  with a history of coronary disease status post CABG in February of 2004.  He  presented on May 22, 2003 with acute inferior myocardial infarction and  underwent stenting of the vein graft to the right coronary artery, as well  as an occluded posterolateral branch.  He returned in July of 2004 and had  reocclusion of the posterolateral branch, as well as a high-grade  anastomotic lesion to the PDA.  This was successfully angioplastied with  cutting balloon angioplasty.  Since his initial repeat catheterization, he  has been noted to have a high-grade ostial diagonal lesion.  The vein graft  to this vessel is also occluded, and he is now admitted for intervention of  this branch.   PAST MEDICAL HISTORY:  Significant for remote anterior MI status post CABG.  He has a history of hypertension and dyslipidemia.  He also has a history of  anemia, obesity, and varicose veins.   ALLERGIES:  No known allergies.   CURRENT MEDICATIONS:  1. Aspirin 325 mg per day.  2. Vitamin E 400 IU daily.  3. Omega-3 fish oils daily.  4. Calcium supplement daily.  5. Zocor 40 mg daily.  6. Multivitamin daily.  7. Vitamin B-6 daily.  8. Vitamin C 500 mg daily.  9. Metoprolol 50 mg b.i.d.  10.      Plavix 75 mg daily.  11.      Nu-Iron 150 mg daily.  12.      Niaspan 500 mg q.h.s.   SOCIAL HISTORY AND FAMILY HISTORY:  Reviewed in old chart and unchanged.   REVIEW OF SYSTEMS:  Otherwise unremarkable.   PHYSICAL EXAMINATION:  GENERAL:   The patient is a pleasant white male in no  apparent distress.  VITAL SIGNS:  Pulse is 56, blood pressure is 125/70, respirations are 20.  HEENT:  Unremarkable.  There is no JVD or bruits.  LUNGS:  Clear.  CARDIAC:  Without gallops, murmurs, or clicks.  ABDOMEN:  Soft, nontender, without masses or bruits.  EXTREMITIES:  Without edema.  NEUROLOGIC:  Intact.   LABORATORY DATA:  All the labs are pending.   IMPRESSION:  1. Complex coronary artery disease status post coronary artery bypass graft     with failed vein grafts.  Status post prior acute intervention of vein     graft to the right coronary artery and subsequent angioplasty  of the     anastomotic lesion to the posterior descending artery.  Now admitted for     intervention of the ostium of the native diagonal branch.  2. Remote anterior myocardial infarction.  3. Status post coronary artery bypass graft.  4. Hyperlipidemia.  5. Hypertension.                                                Peter M. Swaziland, M.D.    PMJ/MEDQ  D:  07/14/2003  T:  07/14/2003  Job:  [id number]   cc:   Loraine Leriche A. Waynard Edwards, M.D.  [address]  [place]  [place] [address]  Fax: [phone]

## 2011-04-22 NOTE — H&P (Signed)
[name], [name] NO.:  [id number]   MEDICAL RECORD NO.:  [id number]                   PATIENT TYPE:  INP   LOCATION:  2923                                 FACILITY:  [facility]   PHYSICIAN:  [name], M.D.               DATE OF BIRTH:  1935/09/26   DATE OF ADMISSION:  05/22/2003  DATE OF DISCHARGE:                                HISTORY & PHYSICAL   HISTORY OF PRESENT ILLNESS:  [name] is a 76 year old white male with a  history of atherosclerotic coronary artery disease, status post anterior  myocardial infarction in 1990, treated with angioplasty.  He is status post  CABG times six in February of 2004 by Dr. Dorris Fetch with a LIMA graft to  the LAD, preradial graft to the OM-1, sequential saphenous vein graft to the  first and second diagonal branches, and a sequential vein graft to the PDA  and posterolateral branches of the right coronary artery.  Patient has done  very well since then without any symptoms.  He awoke abruptly at 7 a.m. with  acute left arm and left precordial chest pain.  He had no shortness of  breath, diaphoresis, nausea, or vomiting.  He took three sublingual  nitroglycerin without relief.  On arrival to the emergency room, he was  noted to have new ST elevation inferiorly.   PAST MEDICAL HISTORY:  1. Remote anterior myocardial infarction.  2. Status post CABG times six.  3. Hypertension.  4. Hyperlipidemia.  5. Anemia.  6. Obesity.  7. History of varicose veins with venostasis disease.  8. Status post T&A.   ALLERGIES:  No known allergies.   MEDICATIONS:  1. Aspirin daily.  2. Vitamin E 400 IU daily.  3. Omega-3 fish oil daily.  4. Coral and calcium supplement daily.  5. Zocor 40 mg per day.  6. Multivitamin daily.  7. Vitamin B6 daily.  8. Vitamin C 500 mg per day.  9. Metoprolol 50 mg per day.   SOCIAL HISTORY:  Patient is retired from the [employer].  He is married and has  three children.  Denies  tobacco or alcohol use.   FAMILY HISTORY:  Father died at age 65 of old age.  Mother died at age 57  and had previous coronary artery bypass surgery.  One brother has had  coronary artery bypass surgery.  One sister died with cancer.   REVIEW OF SYSTEMS:  As noted.   PHYSICAL EXAMINATION:  GENERAL:  Patient is a pale white male in no apparent  distress.  VITAL SIGNS:  Blood pressure is 126/74, pulse is 55 and normal sinus rhythm,  respirations are 20.  HEENT EXAM:  Pupils are equal, round, and reactive.  Conjunctivae are clear.  Oropharynx is clear.  NECK:  Without JVD, adenopathy, or bruits.  LUNGS:  Clear.  CARDIAC EXAM:  Without gallops, murmurs, rubs, or clicks.  ABDOMEN:  Soft and nontender.  There are no masses or bruits.  EXTREMITIES:  Without edema.  Pulses are 2+ and symmetric.  He does have  varicose veins bilaterally.  NEUROLOGIC EXAM:  Intact.   LABORATORY DATA:  ECG shows normal sinus rhythm with old anterior myocardial  infarction.  There is new 1 mm of ST elevation in leads II, III, and aVF.   IMPRESSION:  1. Acute inferior myocardial infarction.  2. Status post coronary artery bypass graft.  3. Remote anterior myocardial infarction.  4. Hypertension.  5. Anemia.  6. Hypercholesterolemia.   PLAN:  Patient is treated acutely with aspirin, IV nitroglycerin, and IV  heparin.  He will be taken for emergent cardiac catheterization and  potential intervention.                                               [name], M.D.    PMJ/MEDQ  D:  05/22/2003  T:  05/23/2003  Job:  [id number]   cc:   Loraine Leriche A. Waynard Edwards, M.D.  [address]  [place]  [place] [address]  Fax: [phone]   Salvatore Decent. Dorris Fetch, M.D.  [address]  [place]  [place] [address]  Fax: [phone]    cc:   Redge Gainer. Waynard Edwards, M.D.  [address]  [place]  [place] [address]  Fax: [phone]   Salvatore Decent. Dorris Fetch, M.D.  [address]  [place]  [place] [address]  Fax: [phone]

## 2011-04-22 NOTE — Discharge Summary (Signed)
NAMETWAIN, STENSETH NO.:  [id number]   MEDICAL RECORD NO.:  [id number]                   PATIENT TYPE:  INP   LOCATION:  2033                                 FACILITY:  [facility]   PHYSICIAN:  Salvatore Decent. Dorris Fetch, M.D.         DATE OF BIRTH:  08/04/1935   DATE OF ADMISSION:  01/30/2003  DATE OF DISCHARGE:  02/04/2003                                 DISCHARGE SUMMARY   ADMISSION DIAGNOSIS:  Severe three vessel coronary artery disease.   DISCHARGE DIAGNOSIS:  Three vessel coronary artery disease, status post  coronary artery bypass graft.   PAST MEDICAL HISTORY:  1. Coronary artery disease, status post myocardial infarction accompanied by     a defibrillation arrest, percutaneous transluminal coronary angioplasty,     and percutaneous transluminal coronary angioplasty in 1990.  2. Obstructive sleep apnea.  3. Chronic venous insufficiency and varicose veins.  4. Hypertension.  5. Hypercholesterolemia.   ALLERGIES:  No known drug allergies.   BRIEF HISTORY:  The patient is a 76 year old gentleman who had done well  status post his MI in 40 until recently.  He had some stable exertional  anginal symptoms over the last couple of years that were treated medically;  however, the last several weeks prior to admission, he noted he had increase  in frequency and severity of left arm discomfort.  He was referred to Dr.  Peter M. Swaziland for evaluation who performed a cardiac catheterization.  This catheterization revealed severe three vessel coronary artery disease  with ejection fraction practically 45%.  The patient was now admitted for a  PCI.  He was referred to [facility] for surgical consideration.   He was evaluated by Dr. Viviann Spare C. Hendrickson at the CVTS office on February 22, 2003.  After examination of the patient, he reviewed available records  including the catheterization films, he recommended proceeding with coronary  artery bypass grafting,  risks of survival, benefits, and relief of symptoms.  The procedure risks and benefits were discussed with the patient and he  agreed to proceed.  His surgery was scheduled.   HOSPITAL COURSE:  On January 30, 2003, the patient was admitted to [facility] under the care of Dr. Viviann Spare C. Hendrickson.  He had  the following surgical procedure.  Coronary artery bypass grafting x 6.  The  grafts placed at the time of procedure were left internal mammary artery  graft to the left anterior descending artery, left radial artery graft to  the first obtuse marginal artery, saphenous vein graft was grafted in  sequential fashion to the posterior descending and posterolateral arteries,  and saphenous vein graft was grafted in a sequential fashion to the first  and second diagonal arteries.  He tolerated this procedure well and was  transported in stable condition to the SICU.  He remained hemodynamically  stable in the postoperative period and was extubated several hours after  arrival on the ICU.   The patient made very good progress recovering from surgery until  postoperative day three when he experienced some significant nausea and  vomiting.  This continued over the next 24 to 48 hours.  Amylase and lipase  were drawn and these returned as normal.   On the morning of February 03, 2003, his nausea began to resolve and it was gone  completely by the morning.  He was able to tolerate food that day.  The  morning of February 04, 2003, postoperative day five, the patient reported  feeling much better.  He was anxious to go home.  His heart has maintained  normal sinus rhythm.  His lungs are clear.  He did still have some mild  queasiness; however, no further vomiting.  He did have a bowel movement on  the evening of February 03, 2003.  His incisions were all healing well.  Specifically his left hand is neurologically intact and has minimal lower  extremity edema.  He is ambulating in the  hallway without difficulty.  His  pain is well controlled with Ultram.  As his GI symptoms are the only thing  holding him back and this is much improved, the patient was discharged on  February 04, 2003.   LABORATORY DATA:  Recent laboratory studies on March1, 2004 showed a CBC  white blood cells 8.1, hemoglobin 9.6, hematocrit 27.2, platelets 176,000.  Chemistries included sodium of 140, potassium 4.0, BUN 13, creatinine 1.2,  glucose 105.  Lipase 23 and amylase 41.   CONDITION ON DISCHARGE:  Improved.   DISCHARGE MEDICATIONS:  1. Discharge medications are not available at this time; however, they did     include Lopressor 25 mg p.o. b.i.d.  2. Protonix 40 mg p.o. q.d.  3. Imdur 30 mg p.o. q.d. x 30 days.  4. Ultram 50 mg 1-2 p.o. q.4-6h. p.r.n. pain.   ACTIVITY:  He has been asked to refrain from any driving or heavy lifting.  He has instructions to continue his breathing exercises and daily walking.   DIET:  Low fat, low salt diet.   WOUND CARE:  He may shower with warm soap and water.  If his incision become  red, hot, swollen, draining, or if he has a fever greater 101, he has been  instructed to call Dr. Viviann Spare C. Hendrickson's office.   FOLLOW UP:  Dr. Peter M. Swaziland would like to see him in the office in two  weeks and he has been asked to call and arrange that appointment.  Dr.  Salvatore Decent. Dorris Fetch would like to see him in the office in three weeks and  the office will call and obtain a time for that appointment.      Toribio Harbour, R.N.                  Salvatore Decent. Dorris Fetch, M.D.    CTK/MEDQ  D:  02/26/2003  T:  02/28/2003  Job:  [id number]   cc:   Salvatore Decent. Dorris Fetch, M.D.  [address]  [place]  [place] [address]  Fax: [phone]   Peter M. Swaziland, M.D.  [address], [address]  [place], [place] [address]  Fax: [phone]   CVTS office   Patient's chart

## 2011-04-22 NOTE — Cardiovascular Report (Signed)
NAMEJALAN, [name] NO.:  [id number]   MEDICAL RECORD NO.:  [id number]                   PATIENT TYPE:  INP   LOCATION:  2018                                 FACILITY:  [facility]   PHYSICIAN:  Peter M. Swaziland, M.D.               DATE OF BIRTH:  1935-09-03   DATE OF PROCEDURE:  05/22/2003  DATE OF DISCHARGE:  05/25/2003                              CARDIAC CATHETERIZATION   PROCEDURE PERFORMED:  Cardiac catheterization.   CARDIOLOGIST:  Peter M. Swaziland, M.D.   INDICATIONS FOR PROCEDURE:  The patient is a 76 year old white male with  history of coronary artery disease.  He is status post anterior myocardial  infarction in 1990.  He is status post CABG in February 2004.  He presents  with an acute inferior myocardial infarction.   ACCESS:  Access will via the right femoral artery using the standard  Seldinger technique.   EQUIPMENT:  Six French 4 cm right and left Judkins catheters.  Six French  pigtail catheter.  Six French LIMA catheter.  Seven French arterial sheath.  Seven [place] JR-4 guide and 7 [place] multipurpose-1 guide.  A 0.014 Luge  wire, a 0.014 Crossest ET wire, a 2.0 x 15 CrossSail balloon, a 3.0 x 30 mm  CrossSail balloon, a 2.5 x 20 mm CrossSail balloon, and a 3.5 x 33 mm CYPHER  drug eluding stent.   CONTRAST:  Three-hundred-seventy milliliters of Omnipaque.   MEDICATIONS:  Nitroglycerin drip at 3 drops/hour.  Integrelin double bolus  at 0.18 mg/kg followed by continuous infusion at 2.01 mcg/kg/minute.  Heparin a total of 3,000 units IV.  Plavix 300 mg p.o.  Nitroglycerin 200  mcg intracoronary times one.   HEMODYNAMIC DATA:  Aortic pressure is 126/79 with a mean of 101.  Left  ventricular pressure is 124 with an EDP of 33 mmHg.   ANGIOGRAPHIC DATA:  The left coronary artery rises and distributes normally.   The left main coronary is ectatic without obstructive disease.   The left anterior descending has a 90% stenosis at the  takeoff of the  diagonal branch.  Following this there is a 95% stenosis of the first septal  perforator and then the LAD is occluded in the midvessel.  The diagonal  branch begins as a single vessel and then bifurcates into two moderate-sized  diagonal branches.  There is a 90% stenosis of the diagonal at the ostium at  the origin from the LAD.   The left circumflex coronary artery is occluded after the first marginal  branch.  The distal circumflex supplies the right-to-left collaterals.  The  first marginal branch has a 70% stenosis proximally.   The right coronary artery rises normally and is a dominant vessel.  It is  ectatic.  There is a long 70-80% stenosis in the proximal vessel.  The  posterior descending and posterolateral branches of the right coronary  artery are  occluded at the takeoff.   Saphenous vein graft to the first and second diagonal branches is occluded  proximally.   Saphenous vein graft to the posterior descending and posterolateral branches  of the right coronary artery is patent.  There is a long area of stenosis in  the body of the vein graft up to 90%.  A continuation of this graft after  the posterior descending artery is occluded.   The free radial graft to the first obtuse marginal branch is widely patent.   The LIMA graft to the LAD is widely patent.  There is severe disease in the  far distal LAD at the apex, which is chronic.  This is up to 90-95%.   Left ventricular angiography is performed in the RAO and LAO cranial views.  This demonstrates mild left ventricular enlargement.  There is apical  dyskinesia.  There is mild mid inferior and posterolateral wall hypokinesia.  Overall systolic function is mild-to-moderately reduced with the ejection  fraction estimated at 45%.   After review of his prior study and his current angiograms it was felt that  the culprit lesion of his acute inferior myocardial infarction is occlusion  of the continuation  of the vein graft to the posterolateral branch in the  right coronary artery.  While the graft to the diagonals was occluded there  was still antegrade to the diagonal branches.  The occlusion of the distal  circumflex was a chronic lesion.  We therefore attempted to acutely dilate  the posterolateral branch of the right coronary artery.  We initially  attempted this via the native right coronary artery; however, while we were  able to approach to lesion easily with the wire we were unable to cross the  total occlusion of the posterolateral branch; this, despite, using several  different wires.  We then proceeded to attempt intervention via the  saphenous vein graft.   With a fair amount of difficulty we were able to final negotiate a wire  through the continuation branch and down the posterolateral branch of the  right coronary artery.  This was difficult since there was an acute cutoff  and there were no landmarks to guide [place]; but, once we were able to cross  with the wire we did balloon inflations using a 2.0 mm CrossSail balloon up  to eight atmospheres.  With this we did restore flow to the posterolateral  branch albeit tenuous.  The patient did develop reperfusion arrhythmias with  accelerated idioventricular rhythm.  He did have resolution of his left arm  pain.  At this point, however, there appeared to be diminished blood flow to  the PDA more proximally.  This was felt to be predominantly due to the  sluggish flow through the vein graft given the critical lesion in the body  of the vein graft.  We next addressed this lesion, predilating it with a 3.0  x 30 mm CrossSail balloon up to eight atmospheres.  We then deployed a 3.5 x  30 mm CYPHER drug eluding stent in the body of rhe vein graft deploying at  11 atmospheres and post dilating for 16 atmospheres.  This yielded an  excellent angiographic result in this segment with 05 residual stenosis. There was markedly improved distal  flow with restoration of a flow to the  posterior descending artery.  There was also improved flow to the  posterolateral branch,  but the lesion proximal to that was incompletely  expanded.  We then dilated this  using a 2.5 x 20 mm CrossSail balloon  dilating up to eight atmospheres.  This yielded a good angioplasty result  with less than 20% residual stenosis.  There was TIMI grade III flow.  We  did make an attempt to cross this lesion with a 2.5 x 23 mm CYPHER drug  eluding stent, but despite having good backup and wire position we were  unable to cross this lesion due to the angulation.  We therefore withdrew  the wire and the stent without difficulty and stopped the procedure at this  point with good angiographic result.   The patient was pain-free at this point.   FINAL INTERPRETATION:  1. Severe three-vessel obstructive atherosclerotic coronary artery disease.  2. Early degeneration of vein grafts with occlusion of vein graft to the     first and second diagonal branches.  Severe disease in the vein graft to     the posterior descending artery with occlusion in the continuation of the     posterolateral branch of the right coronary artery.  3. Patent radial artery graft to the obtuse marginal branch.  4. Patent left internal mammary artery graft to he left anterior descending.  5. Mild-to-moderate left ventricular dysfunction.  6. Successful stenting of the body of the vein graft to the posterior     descending artery with successful angioplasty to the continuation of the     posterolateral branch of the right coronary artery.                                                 Peter M. Swaziland, M.D.    PMJ/MEDQ  D:  05/22/2003  T:  05/25/2003  Job:  [id number]  Loraine Leriche A. Waynard Edwards, M.D.  [address]  [place]  [place] [address]  Fax: [phone]   Salvatore Decent. Dorris Fetch, M.D.  [address]  [place]  [place] [address]  Fax: [phone]   cc:   Redge Gainer. Waynard Edwards, M.D.  [address]  [place]  [place] [address]  Fax: [phone]   Salvatore Decent. Dorris Fetch, M.D.  [address]  [place]  [place] [address]  Fax: [phone]

## 2011-04-22 NOTE — Discharge Summary (Signed)
[name], [name] NO.:  [id number]   MEDICAL RECORD NO.:  [id number]                   PATIENT TYPE:  OIB   LOCATION:  6526                                 FACILITY:  [facility]   PHYSICIAN:  Peter M. Swaziland, M.D.               DATE OF BIRTH:  1935/01/24   DATE OF ADMISSION:  07/15/2003  DATE OF DISCHARGE:  07/16/2003                                 DISCHARGE SUMMARY   INDICATIONS FOR ADMISSION:  The patient is a 76 year old, history of  coronary disease, status post CABG in February 2004.  He presented with  acute inferior myocardial infarction in June related to high-grade stenosis  in the vein graft, posterolateral branch, and occlusion of the  posterolateral branch.  He underwent stenting of the vein graft at that time  and then angioplasty of the posterolateral branch.  He returned in July with  recurrent event.  The posterolateral branch was occluded.  He underwent  angioplasty of anastomotic lesion to the posterior descending artery.  Plans  were to readmit him for intervention of high-grade diagonal lesion.   For details of his past medical history, social history, family history, and  physical exam, please see admission history and physical.   LABORATORY DATA:  Coagulations were normal.  Hemoglobin 11.7, otherwise CBC  was normal.  BMET was normal.   The patient underwent angiography on July 15, 2003.  Unfortunately, this  demonstrated occlusion of the proximal vein graft of the posterior  descending artery.  The posterior descending was supplied through the native  right coronary artery which had a long lesion involving the proximal mid  vessel to 80%.  The procedure with stenting of the native right coronary  artery using sequential 4 x 33 mm ZETA stent, 4 x 8 mm ZETA stent adequately  covered the lesion.  This yielded an excellent angiographic result, 0%  residual stenosis, and excellent flow down the PDA.  We then turned our  attention to the high-grade ostial diagonal lesion.  The LAD was supplied by  the mammary graft.  This lesion was also successfully dilated and stented  using a 3.5 x 15 mm CYPHER stent.  This yielded also an excellent  angiographic result with 0% residual stenosis and TIMI-3 flow.  The patient  tolerated the procedure well.  He had no groin complications.  His ECG  remained stable.  He was discharged home in stable condition on July 16, 2003.   DISCHARGE DIAGNOSES:  1. Atherosclerotic coronary artery disease, status post coronary artery     bypass grafting with early vein graft failure.  Now status post     successful stenting of the proximal to mid right coronary artery and the     ostial first diagonal branch.  2. Dyslipidemia.  3. Hypertension.  4. Remote anterior myocardial infarction.   DISCHARGE MEDICATIONS:  1. Aspirin 325 mg daily.  2. Plavix  75 mg daily.  3. Zocor 40 mg per day.  4. Niaspan 500 mg q.h.s.  5. Metoprolol 50 mg twice a day.  6. Multivitamins daily.   The patient will follow up with Dr. Swaziland in two weeks.  Gradually increase  his walking activity, and then we will see if we can get him re-enrolled in  cardiac rehabilitation.  Discharge status is improved.                                                Peter M. Swaziland, M.D.    PMJ/MEDQ  D:  07/16/2003  T:  07/16/2003  Job:  [id number]   cc:   Loraine Leriche A. Waynard Edwards, M.D.  [address]  [place]  [place] [address]  Fax: [phone]

## 2011-04-22 NOTE — H&P (Signed)
NAME:  [name], Flight NO.:  [id number]   MEDICAL RECORD NO.:  [id number]                  PATIENT TYPE:   LOCATION:                                       FACILITY:   PHYSICIAN:  Peter M. Swaziland, M.D.               DATE OF BIRTH:  1935/02/03   DATE OF ADMISSION:  01/21/2003  DATE OF DISCHARGE:                                HISTORY & PHYSICAL   HISTORY OF PRESENT ILLNESS:  The patient is a 76 year-old white male who  presents with complaints of increasing left arm pain with exertion.  He  reports he has had some discomfort in his left arm for the past two years,  but notes that it has increased in severity and duration over the past two  months. He is status post a large anterior myocardial infarction in 1990  complicated by ventricular fibrillation requiring defibrillation times two.  He subsequently underwent angioplasty at [facility].  He does have a history of hypertension and hypercholesterolemia.   PAST MEDICAL HISTORY:  1. Anterior myocardial infarction in 1990 with subsequent PTCA.  2. Hypertension.  3. Hypercholesterolemia.  4. Status post tonsillectomy and adenoidectomy.  5. History of right leg ulcer.   ALLERGIES:  He has no known allergies.   CURRENT MEDICATIONS:  1. Aspirin 325 mg daily.  2. Vitamin E 400 international units daily.  3. Omega-3 fish oil daily.  4. Zocor 40 mg per day.  5. Lisinopril 10 mg per day.  6. Multivitamin daily.  7. Vitamin B6 daily.  8. Vitamin C 500 mg daily.   SOCIAL HISTORY:  The patient is retired from the [employer].  He is married and has  three children.  He denies alcohol or tobacco use.   FAMILY HISTORY:  Father died at age 68 of old age.  Mother died at age 70  and had had previous coronary bypass surgery. His brother had coronary  bypass surgery in his 60's.  One sister died with cancer.   REVIEW OF SYMPTOMS:  VASCULAR:  He denies any claudication symptoms.  NEUROLOGIC:   No history of TIA or stroke.  PULMONARY:  He denies orthopnea  or PND.  EXTREMITIES:  He has had some chronic lower extremity edema.  GI:  He denies any bowel or bladder complaints.  All other review of systems are  negative.   PHYSICAL EXAMINATION:  GENERAL:  On physical examination the patient is an  obese white male in no apparent distress.  VITAL SIGNS:  Blood pressure is 120/88, pulse 68 and regular, weight is 260,  respirations are normal.  HEENT EXAM:  Pupils equal, round and reactive.  Conjunctivae are clear.  Oropharynx is clear.  The neck is without JVD, adenopathy or bruits.  LUNGS:  Clear.  CARDIAC EXAM:  Regular rate and rhythm without murmurs, rubs, gallops or  clicks.  ABDOMEN:  Obese, soft and nontender.  No masses or hepatosplenomegaly.  EXTREMITIES:  Femoral and pedal pulses are 2+ and symmetric.  He has 2+  edema on the right with trace edema on the left.  NEUROLOGIC EXAM:  Nonfocal.   LABORATORY DATA:  EKG shows normal sinus rhythm, a left anterior fascicular  block, an old anterior myocardial infarction.   Chest x-ray shows no active disease.   IMPRESSION:  1. Angina pectoris with increase in severity.  2. Old anterior myocardial infarction status post percutaneous transluminal     coronary angioplasty.  3. Hypertension.  4. Hypercholesterolemia.  5. Obesity.   PLAN:  The patient will be admitted for cardiac catheterization with further  therapy pending these results.                                               Peter M. Swaziland, M.D.    PMJ/MEDQ  D:  01/17/2003  T:  01/17/2003  Job:  [id number]   cc:   Loraine Leriche A. Waynard Edwards, M.D.  [address]  [place]  [place] [address]  Fax: [phone]

## 2011-06-07 ENCOUNTER — Other Ambulatory Visit: Payer: Self-pay | Admitting: *Deleted

## 2011-06-07 MED ORDER — METOPROLOL TARTRATE 50 MG PO TABS: 50.0000 mg | ORAL_TABLET | Freq: Two times a day (BID) | ORAL | Status: DC

## 2011-06-07 NOTE — Telephone Encounter (Signed)
escribe medication per fax request  

## 2011-08-02 ENCOUNTER — Encounter: Payer: Self-pay | Admitting: Cardiology

## 2011-08-16 ENCOUNTER — Encounter: Payer: Self-pay | Admitting: Cardiology

## 2011-08-16 ENCOUNTER — Ambulatory Visit (INDEPENDENT_AMBULATORY_CARE_PROVIDER_SITE_OTHER): Payer: Medicare Other | Admitting: Cardiology

## 2011-08-16 DIAGNOSIS — I252 Old myocardial infarction: Secondary | ICD-10-CM | POA: Insufficient documentation

## 2011-08-16 DIAGNOSIS — I872 Venous insufficiency (chronic) (peripheral): Secondary | ICD-10-CM | POA: Insufficient documentation

## 2011-08-16 DIAGNOSIS — I2510 Atherosclerotic heart disease of native coronary artery without angina pectoris: Secondary | ICD-10-CM

## 2011-08-16 DIAGNOSIS — E785 Hyperlipidemia, unspecified: Secondary | ICD-10-CM | POA: Insufficient documentation

## 2011-08-16 DIAGNOSIS — I10 Essential (primary) hypertension: Secondary | ICD-10-CM | POA: Insufficient documentation

## 2011-08-16 NOTE — Progress Notes (Signed)
[name] Date of Birth: 01-11-1935   History of Present Illness: [name] is seen today for followup. He reports that he has been doing well. He admits that [facility] and as a result has gained 5 pounds. He denies any significant chest pain or shortness of breath. He's had no palpitations. His lower extremity swelling is about the same.  Current Outpatient Prescriptions on File Prior to Visit  Medication Sig Dispense Refill  . aspirin 325 MG EC tablet Take 325 mg by mouth daily.        . Cholecalciferol (VITAMIN D3) 1000 UNITS CAPS Take by mouth daily.        . Coconut Oil OIL Virgin Oil de Coco Creme (1 Tab Daily)       . KRILL OIL PO Take by mouth daily. Taking Triple Strength ( 1 Tab Daily (2 soft gel))      . metoprolol (LOPRESSOR) 50 MG tablet Take 1 tablet (50 mg total) by mouth 2 (two) times daily.  60 tablet  5  . Multiple Vitamin (MULTIVITAMIN) tablet Take 1 tablet by mouth daily.        . niacin (NIASPAN) 1000 MG CR tablet Take 1,000 mg by mouth at bedtime.        . nitroGLYCERIN (NITROSTAT) 0.4 MG SL tablet Place 0.4 mg under the tongue every 5 (five) minutes as needed.        Marland Kitchen Ubiquinol 50 MG CAPS Take 1 capsule by mouth daily.        . Coenzyme Q10 (COQ10) 200 MG CAPS Take by mouth daily.        Marland Kitchen DISCONTD: simvastatin (ZOCOR) 40 MG tablet Take 40 mg by mouth at bedtime.          No Known Allergies  Past Medical History  Diagnosis Date  . Coronary artery disease   . MI, old     INFERIOR  . Hypertension   . Hyperlipidemia   . Chronic venous insufficiency     Past Surgical History  Procedure Date  . Cardiac catheterization 06/30/2003    EF 50%  . Coronary artery bypass graft 01/2003    X6. LIMA GRAFT TO THE LDA, FREE RADICAL GRAFT TO THE OM1, SEQUENTIAL SAPHENOUS VEIN GRAFT TO THE FIRST AND SECOND DIAGONAL BRANCES, AND A SEQUENTIAL  VEIN GRAFT TO THE PDA AND POSTERIOR LATERAL BRANCHES OF THE RIGHT CORONARY  .  Coronary angioplasty 06/2003    POSTERIOR LATERAL BRANCH  . Tonsillectomy and adenoidectomy   . Cardiovascular stress test 01/23/2009    EF 44%    History  Smoking status  . Never Smoker   Smokeless tobacco  . Not on file    History  Alcohol Use No    Family History  Problem Relation Age of Onset  . Cancer Sister     Review of Systems: The review of systems is positive for chronic lower extremity swelling. He does have skin discoloration but has had no ulceration. All other systems were reviewed and are negative.  Physical Exam: BP 118/78  Pulse 60  Ht 6\' 2"  (1.88 m)  Wt 276 lb 3.2 oz (125.283 kg)  BMI 35.46 kg/m2 The patient is alert and oriented x 3.  The mood and affect are normal.  The skin is warm and dry.  Color is normal.  The HEENT exam reveals that the sclera are nonicteric.  The mucous membranes are moist.  The carotids  are 2+ without bruits.  There is no thyromegaly.  There is no JVD.  The lungs are clear.  The chest wall is non tender.  The heart exam reveals a regular rate with a normal S1 and S2.  There are no murmurs, gallops, or rubs.  The PMI is not displaced.   Abdominal exam reveals good bowel sounds.  There is no guarding or rebound.  There is no hepatosplenomegaly or tenderness.  Lower extremities reveal chronic edema with support hose in place. He has hyperpigmentation of the skin. Cranial nerves II - XII are intact.  Motor and sensory functions are intact.  The gait is normal. LABORATORY DATA:   Assessment / Plan:

## 2011-08-16 NOTE — Assessment & Plan Note (Signed)
His last lipid panel in September of 2011 looked excellent. He is scheduled for fasting lab work with Dr. Waynard Edwards.

## 2011-08-16 NOTE — Assessment & Plan Note (Signed)
Blood pressure is under excellent control today. He will continue with his current therapy.

## 2011-08-16 NOTE — Assessment & Plan Note (Signed)
[name] remains asymptomatic. His primary anginal symptoms before were left arm pain. He had multiple coronary interventions as noted in the overview in 2004. His last stress Cardiolite study in February 2010 showed fixed defects in the anterior apex and inferior wall without ischemia. I'll plan on a followup stress test next year.

## 2011-08-16 NOTE — Patient Instructions (Signed)
Continue with your current medications.  [facility]  I will see you again in 6 months.

## 2011-11-14 ENCOUNTER — Encounter: Payer: Self-pay | Admitting: Cardiology

## 2011-12-14 ENCOUNTER — Other Ambulatory Visit: Payer: Self-pay | Admitting: Cardiology

## 2011-12-14 MED ORDER — METOPROLOL TARTRATE 50 MG PO TABS: 50.0000 mg | ORAL_TABLET | Freq: Two times a day (BID) | ORAL | Status: DC

## 2012-02-17 ENCOUNTER — Ambulatory Visit: Payer: Medicare Other | Admitting: Cardiology

## 2012-03-28 ENCOUNTER — Ambulatory Visit (INDEPENDENT_AMBULATORY_CARE_PROVIDER_SITE_OTHER): Payer: Medicare Other | Admitting: Cardiology

## 2012-03-28 ENCOUNTER — Encounter: Payer: Self-pay | Admitting: Cardiology

## 2012-03-28 VITALS — BP 118/68 | HR 51 | Ht 74.0 in | Wt 284.0 lb

## 2012-03-28 DIAGNOSIS — I2510 Atherosclerotic heart disease of native coronary artery without angina pectoris: Secondary | ICD-10-CM

## 2012-03-28 DIAGNOSIS — I252 Old myocardial infarction: Secondary | ICD-10-CM

## 2012-03-28 DIAGNOSIS — I10 Essential (primary) hypertension: Secondary | ICD-10-CM

## 2012-03-28 DIAGNOSIS — E785 Hyperlipidemia, unspecified: Secondary | ICD-10-CM

## 2012-03-28 NOTE — Assessment & Plan Note (Signed)
Blood pressure is well controlled. He does complain of some fatigue and is fairly bradycardic today. We will reduce his metoprolol to 25 mg twice a day.

## 2012-03-28 NOTE — Progress Notes (Signed)
[name] Date of Birth: Dec 10, 1934   History of Present Illness: [name] is seen today for followup. He reports that he has been doing well. He hasn't been going to the gym for exercise but tries to exercise some in his house. He has gained a pound since his last visit. He denies any significant chest pain or shortness of breath. He recently had some musculoskeletal pain in his right neck and right arm. He denies any dyspnea. His lower extremity edema is stable. He has had no ulceration.  Current Outpatient Prescriptions on File Prior to Visit  Medication Sig Dispense Refill  . aspirin 325 MG EC tablet Take 325 mg by mouth daily.        . Coconut Oil OIL Virgin Oil de Coco Creme (1 Tab Daily)       . KRILL OIL PO Take by mouth daily. Taking Triple Strength ( 1 Tab Daily (2 soft gel))      . metoprolol (LOPRESSOR) 50 MG tablet Take 1 tablet (50 mg total) by mouth 2 (two) times daily.  60 tablet  5  . Multiple Vitamin (MULTIVITAMIN) tablet Take 1 tablet by mouth daily.        . niacin (NIASPAN) 1000 MG CR tablet Take 1,000 mg by mouth at bedtime.        . nitroGLYCERIN (NITROSTAT) 0.4 MG SL tablet Place 0.4 mg under the tongue every 5 (five) minutes as needed.        . simvastatin (ZOCOR) 80 MG tablet Take 40 mg by mouth at bedtime.       Marland Kitchen Ubiquinol 50 MG CAPS Take 1 capsule by mouth daily. 100 mg         No Known Allergies  Past Medical History  Diagnosis Date  . Coronary artery disease   . MI, old     INFERIOR  . Hypertension   . Hyperlipidemia   . Chronic venous insufficiency     and varicose veins  . Anterior myocardial infarction 1990    with subsequent PTCA  . Hypercholesterolemia   . Ulcer of right leg   . Obstructive sleep apnea   . Anemia   . Obesity     Past Surgical History  Procedure Date  . Cardiac catheterization 06/30/2003    EF 50%  . Coronary artery bypass graft 01/2003    X6. LIMA GRAFT TO THE LDA, FREE RADICAL GRAFT TO THE OM1, SEQUENTIAL  SAPHENOUS VEIN GRAFT TO THE FIRST AND SECOND DIAGONAL BRANCES, AND A SEQUENTIAL  VEIN GRAFT TO THE PDA AND POSTERIOR LATERAL BRANCHES OF THE RIGHT CORONARY  . Coronary angioplasty 06/2003    POSTERIOR LATERAL BRANCH  . Tonsillectomy and adenoidectomy   . Cardiovascular stress test 01/23/2009    EF 44%    History  Smoking status  . Never Smoker   Smokeless tobacco  . Not on file    History  Alcohol Use No    Family History  Problem Relation Age of Onset  . Cancer Sister     Review of Systems: The review of systems is positive for chronic lower extremity swelling. He does have skin discoloration but has had no ulceration. All other systems were reviewed and are negative.  Physical Exam: BP 118/68  Pulse 51  Ht 6\' 2"  (1.88 m)  Wt 284 lb (128.822 kg)  BMI 36.46 kg/m2 The patient is alert and oriented x 3.  The mood and affect are normal.  The skin is warm  and dry.  Color is normal.  The HEENT exam reveals that the sclera are nonicteric.  The mucous membranes are moist.  The carotids are 2+ without bruits.  There is no thyromegaly.  There is no JVD.  The lungs are clear.  The chest wall is non tender.  The heart exam reveals a regular rate with a normal S1 and S2.  There are no murmurs, gallops, or rubs.  The PMI is not displaced.   Abdominal exam reveals good bowel sounds.  There is no guarding or rebound.  There is no hepatosplenomegaly or tenderness.  Lower extremities reveal chronic edema with support hose in place. He has hyperpigmentation of the skin. Cranial nerves II - XII are intact.  Motor and sensory functions are intact.  The gait is normal. LABORATORY DATA: ECG today demonstrates sinus bradycardia with a rate of 51 beats per minute. He has low voltage. There is evidence of old inferior lateral myocardial infarction.  Assessment / Plan:

## 2012-03-28 NOTE — Assessment & Plan Note (Signed)
Blood work was reviewed from December and showed excellent control of his lipids which are at goal.

## 2012-03-28 NOTE — Assessment & Plan Note (Signed)
He is status post CABG in 2004. He subsequently had stenting of the vein graft to the right coronary and angioplasty of posterior lateral branch and the first diagonal later that year. He remains asymptomatic. It has been over 3 years since his last ischemic evaluation. We will schedule him for a stress Myoview.

## 2012-03-28 NOTE — Patient Instructions (Signed)
Reduce your metoprolol to 25 mg twice a day.  We will schedule you for a nuclear stress test.  Otherwise I will see you again in 6 months.

## 2012-04-05 ENCOUNTER — Ambulatory Visit (HOSPITAL_COMMUNITY): Payer: Medicare Other | Attending: Cardiology | Admitting: Radiology

## 2012-04-05 VITALS — BP 123/80 | Ht 74.0 in | Wt 276.0 lb

## 2012-04-05 DIAGNOSIS — E785 Hyperlipidemia, unspecified: Secondary | ICD-10-CM | POA: Insufficient documentation

## 2012-04-05 DIAGNOSIS — I252 Old myocardial infarction: Secondary | ICD-10-CM | POA: Insufficient documentation

## 2012-04-05 DIAGNOSIS — R0609 Other forms of dyspnea: Secondary | ICD-10-CM | POA: Insufficient documentation

## 2012-04-05 DIAGNOSIS — E669 Obesity, unspecified: Secondary | ICD-10-CM | POA: Insufficient documentation

## 2012-04-05 DIAGNOSIS — I2510 Atherosclerotic heart disease of native coronary artery without angina pectoris: Secondary | ICD-10-CM

## 2012-04-05 DIAGNOSIS — I4510 Unspecified right bundle-branch block: Secondary | ICD-10-CM | POA: Insufficient documentation

## 2012-04-05 DIAGNOSIS — Z951 Presence of aortocoronary bypass graft: Secondary | ICD-10-CM | POA: Insufficient documentation

## 2012-04-05 DIAGNOSIS — R0989 Other specified symptoms and signs involving the circulatory and respiratory systems: Secondary | ICD-10-CM | POA: Insufficient documentation

## 2012-04-05 DIAGNOSIS — R42 Dizziness and giddiness: Secondary | ICD-10-CM | POA: Insufficient documentation

## 2012-04-05 DIAGNOSIS — R0602 Shortness of breath: Secondary | ICD-10-CM

## 2012-04-05 DIAGNOSIS — I10 Essential (primary) hypertension: Secondary | ICD-10-CM | POA: Insufficient documentation

## 2012-04-05 DIAGNOSIS — I4949 Other premature depolarization: Secondary | ICD-10-CM

## 2012-04-05 DIAGNOSIS — Z8249 Family history of ischemic heart disease and other diseases of the circulatory system: Secondary | ICD-10-CM | POA: Insufficient documentation

## 2012-04-05 MED ORDER — TECHNETIUM TC 99M TETROFOSMIN IV KIT
33.0000 | PACK | Freq: Once | INTRAVENOUS | Status: AC | PRN
  Administered 2012-04-05: 33 via INTRAVENOUS

## 2012-04-05 MED ORDER — TECHNETIUM TC 99M TETROFOSMIN IV KIT
11.0000 | PACK | Freq: Once | INTRAVENOUS | Status: AC | PRN
  Administered 2012-04-05: 11 via INTRAVENOUS

## 2012-04-05 NOTE — Progress Notes (Signed)
[facility] SITE 3 NUCLEAR MED [address] [place] [address] [phone]  Cardiology Nuclear Med Study  [name] is a 77 y.o. male     MRN : [id number]     DOB: [birth year]  Procedure Date: 04/05/2012  Nuclear Med Background Indication for Stress Test:  Evaluation for Ischemia and PTCA/Stent/Graft Patency  History:  2/04 MI>CABG; 2/10 MPS:No ischemia, apical and inferior defects, EF=44%; 10/04 PTCA/Stent-SVG>RCA;  Cardiac Risk Factors: Family History - CAD, Hypertension, Lipids, Obesity and RBBB  Symptoms:  Dizziness and DOE   Nuclear Pre-Procedure Caffeine/Decaff Intake:  None> 12 hrs NPO After: 11:00pm   Lungs:  Clear. IV 0.9% NS with Angio Cath:  20g  IV Site: R Wrist x 1, tolerated well IV Started by:  Irean Hong, RN  Chest Size (in):  50 Cup Size: n/a  Height: 6\' 2"  (1.88 m)  Weight:  276 lb (125.193 kg)  BMI:  Body mass index is 35.44 kg/(m^2). Tech Comments:  Held lopressor x 36 hours    Nuclear Med Study 1 or 2 day study: 1 day  Stress Test Type:  Stress  Reading MD: Cassell Clement, MD  Order Authorizing Provider:  Peter Swaziland, MD  Resting Radionuclide: Technetium 66m Tetrofosmin  Resting Radionuclide Dose: 11.0 mCi   Stress Radionuclide:  Technetium 35m Tetrofosmin  Stress Radionuclide Dose: 33.0 mCi           Stress Protocol Rest HR: 64 Stress HR: 160  Rest BP: 123/80 Stress BP: 173/92  Exercise Time (min): 5:30 METS: 7.0   Predicted Max HR: 145 bpm % Max HR: 110.34 bpm Rate Pressure Product: 21308   Dose of Adenosine (mg):  n/a Dose of Lexiscan: n/a mg  Dose of Atropine (mg): n/a Dose of Dobutamine: n/a mcg/kg/min (at max HR)  Stress Test Technologist: Smiley Houseman, CMA-N  Nuclear Technologist:  Domenic Polite, CNMT     Rest Procedure:  Myocardial perfusion imaging was performed at rest 45 minutes following the intravenous administration of Technetium 82m Tetrofosmin.  Rest ECG: IRBBB, prior ILWMI and occasional  PVC.  Stress Procedure:  The patient exercised on the treadmill utilizing the Bruce protocol for 5:30 minutes. He then stopped due to fatigue and denied any chest pain.  There were no diagnostic ST-T wave changes.  There were frequent PAC's, PVC's with rare couplets and one 3-beat run of v-tach.  Technetium 26m Tetrofosmin was injected at peak exercise and myocardial perfusion imaging was performed after a brief delay.  Stress ECG: No significant change from baseline ECG  QPS Raw Data Images:  Normal; no motion artifact; normal heart/lung ratio. Stress Images:  There is decreased uptake in the inferior wall and apex. Rest Images:  There is decreased uptake in the inferior wall and apex. Subtraction (SDS):  No reversibility is appreciated. Transient Ischemic Dilatation (Normal <1.22):  0.85 Lung/Heart Ratio (Normal <0.45):  0.36  Quantitative Gated Spect Images QGS EDV:  172 ml QGS ESV:  95 ml  Impression Exercise Capacity:  Good exercise capacity. BP Response:  Normal blood pressure response. Clinical Symptoms:  No chest pain. ECG Impression:  No significant ST segment change suggestive of ischemia. Ventricular ectopy. Comparison with Prior Nuclear Study: No significant change from previous study  Overall Impression:  Abnormal Treadmill Myoview Stress Test.  Old large apical scar.  Old large inferior wall scar involving basal, mid- and apical inferior wall.  No reversible ischemia.  LV Ejection Fraction: 45%.  LV Wall Motion:  Basal and  mid-inferior wall hypokinesis. Apical dyskinesis  Cassell Clement

## 2012-09-06 ENCOUNTER — Other Ambulatory Visit: Payer: Self-pay | Admitting: Cardiology

## 2012-09-06 MED ORDER — METOPROLOL TARTRATE 50 MG PO TABS: 50.0000 mg | ORAL_TABLET | Freq: Two times a day (BID) | ORAL | Status: DC

## 2012-09-28 ENCOUNTER — Ambulatory Visit (INDEPENDENT_AMBULATORY_CARE_PROVIDER_SITE_OTHER): Payer: Medicare Other | Admitting: Cardiology

## 2012-09-28 ENCOUNTER — Encounter: Payer: Self-pay | Admitting: Cardiology

## 2012-09-28 VITALS — BP 124/80 | HR 71 | Ht 74.0 in | Wt 271.1 lb

## 2012-09-28 DIAGNOSIS — E785 Hyperlipidemia, unspecified: Secondary | ICD-10-CM

## 2012-09-28 DIAGNOSIS — I872 Venous insufficiency (chronic) (peripheral): Secondary | ICD-10-CM

## 2012-09-28 DIAGNOSIS — I2510 Atherosclerotic heart disease of native coronary artery without angina pectoris: Secondary | ICD-10-CM

## 2012-09-28 DIAGNOSIS — I10 Essential (primary) hypertension: Secondary | ICD-10-CM

## 2012-09-28 NOTE — Patient Instructions (Signed)
Continue your current therapy  I will see you again in 6 months.   

## 2012-09-29 NOTE — Progress Notes (Signed)
[name] Date of Birth: 04/13/1935   History of Present Illness: [name] is seen today for followup. He reports that he has been doing well. He denies any chest pain, shortness of breath, or palpitations. He does note an improvement in his fatigue with reduction in his metoprolol dose. He predominantly exercises at home either walking or jogging. He has lost 13 pounds since his last visit.  Current Outpatient Prescriptions on File Prior to Visit  Medication Sig Dispense Refill  . aspirin 325 MG EC tablet Take 325 mg by mouth daily.        . Cholecalciferol (VITAMIN D-3 PO) Take by mouth daily.      . Coconut Oil OIL Virgin Oil de Coco Creme (1 Tab Daily)       . KRILL OIL PO Take by mouth daily. Taking Triple Strength ( 1 Tab Daily (2 soft gel))      . metoprolol (LOPRESSOR) 50 MG tablet Take 50 mg by mouth daily. 1/2 tablet twice daily      . Multiple Vitamin (MULTIVITAMIN) tablet Take 1 tablet by mouth daily.        . niacin (NIASPAN) 1000 MG CR tablet Take 1,000 mg by mouth at bedtime.        . nitroGLYCERIN (NITROSTAT) 0.4 MG SL tablet Place 0.4 mg under the tongue every 5 (five) minutes as needed.        . simvastatin (ZOCOR) 80 MG tablet Take 40 mg by mouth at bedtime.       Marland Kitchen Ubiquinol 50 MG CAPS Take 1 capsule by mouth daily. 100 mg         No Known Allergies  Past Medical History  Diagnosis Date  . Coronary artery disease   . MI, old     INFERIOR  . Hypertension   . Hyperlipidemia   . Chronic venous insufficiency     and varicose veins  . Anterior myocardial infarction 1990    with subsequent PTCA  . Hypercholesterolemia   . Ulcer of right leg   . Obstructive sleep apnea   . Anemia   . Obesity     Past Surgical History  Procedure Date  . Cardiac catheterization 06/30/2003    EF 50%  . Coronary artery bypass graft 01/2003    X6. LIMA GRAFT TO THE LDA, FREE RADICAL GRAFT TO THE OM1, SEQUENTIAL SAPHENOUS VEIN GRAFT TO THE FIRST AND SECOND DIAGONAL  BRANCES, AND A SEQUENTIAL  VEIN GRAFT TO THE PDA AND POSTERIOR LATERAL BRANCHES OF THE RIGHT CORONARY  . Coronary angioplasty 06/2003    POSTERIOR LATERAL BRANCH  . Tonsillectomy and adenoidectomy   . Cardiovascular stress test 01/23/2009    EF 44%    History  Smoking status  . Never Smoker   Smokeless tobacco  . Not on file    History  Alcohol Use No    Family History  Problem Relation Age of Onset  . Cancer Sister     Review of Systems: The review of systems is positive for chronic lower extremity swelling. He does have skin discoloration but has had no ulceration. All other systems were reviewed and are negative.  Physical Exam: BP 124/80  Pulse 71  Ht 6\' 2"  (1.88 m)  Wt 271 lb 1.9 oz (122.979 kg)  BMI 34.81 kg/m2  SpO2 97% The patient is alert and oriented x 3.  The mood and affect are normal.  The skin is warm and dry.  Color is normal.  The HEENT exam reveals that the sclera are nonicteric.  The mucous membranes are moist.  The carotids are 2+ without bruits.  There is no thyromegaly.  There is no JVD.  The lungs are clear.  The chest wall is non tender.  The heart exam reveals a regular rate with a normal S1 and S2.  There are no murmurs, gallops, or rubs.  The PMI is not displaced.   Abdominal exam reveals good bowel sounds.  There is no guarding or rebound.  There is no hepatosplenomegaly or tenderness.  Lower extremities reveal chronic edema with support hose in place. He has hyperpigmentation of the skin. Cranial nerves II - XII are intact.  Motor and sensory functions are intact.  The gait is normal. LABORATORY DATA: Stress Myoview study on 04/06/2012 showed apical and inferior wall scar without ischemia. Ejection fraction was 45%.  Assessment / Plan: 1. Coronary disease with remote myocardial infarction. Status post CABG in 2004. Recent Myoview showed no ischemia. Continue with medical management.  2. Hypertension, well controlled.  3. Hyperlipidemia. Continue  niacin and simvastatin.

## 2012-12-26 ENCOUNTER — Other Ambulatory Visit: Payer: Self-pay

## 2012-12-26 MED ORDER — NITROGLYCERIN 0.4 MG SL SUBL: 0.4000 mg | SUBLINGUAL_TABLET | SUBLINGUAL | Status: AC | PRN

## 2013-01-28 ENCOUNTER — Encounter: Payer: Self-pay | Admitting: Cardiology

## 2013-04-01 ENCOUNTER — Encounter: Payer: Self-pay | Admitting: Cardiology

## 2013-04-01 ENCOUNTER — Ambulatory Visit (INDEPENDENT_AMBULATORY_CARE_PROVIDER_SITE_OTHER): Payer: Medicare Other | Admitting: Cardiology

## 2013-04-01 VITALS — BP 124/80 | HR 64 | Ht 74.0 in | Wt 274.1 lb

## 2013-04-01 DIAGNOSIS — I2510 Atherosclerotic heart disease of native coronary artery without angina pectoris: Secondary | ICD-10-CM

## 2013-04-01 DIAGNOSIS — E785 Hyperlipidemia, unspecified: Secondary | ICD-10-CM

## 2013-04-01 DIAGNOSIS — I10 Essential (primary) hypertension: Secondary | ICD-10-CM

## 2013-04-01 DIAGNOSIS — I252 Old myocardial infarction: Secondary | ICD-10-CM

## 2013-04-01 NOTE — Progress Notes (Signed)
[name] Date of Birth: [birth year]   History of Present Illness: [name] is seen today for followup. He has a history of coronary disease and is status post CABG in 2004. He had angioplasty of the posterior lateral branch and that same year. His last nuclear stress test in March of 2013 showed a large apical scar and large inferior wall scar without ischemia. Ejection fraction was 45%. He reports that he has been doing well. He denies any chest pain, shortness of breath, or palpitations.  He predominantly exercises at home either walking or jogging. He has gained 3 pounds since his last visit.  Current Outpatient Prescriptions on File Prior to Visit  Medication Sig Dispense Refill  . aspirin 325 MG EC tablet Take 325 mg by mouth daily.        . Cholecalciferol (VITAMIN D-3 PO) Take by mouth daily.      . Coconut Oil OIL Virgin Oil de Coco Creme (1 Tab Daily)       . KRILL OIL PO Take by mouth daily. Taking Triple Strength ( 1 Tab Daily (2 soft gel))      . metoprolol (LOPRESSOR) 50 MG tablet Take 50 mg by mouth daily. 1/2 tablet twice daily      . Multiple Vitamin (MULTIVITAMIN) tablet Take 1 tablet by mouth daily.        . niacin (NIASPAN) 1000 MG CR tablet Take 1,000 mg by mouth at bedtime.        . nitroGLYCERIN (NITROSTAT) 0.4 MG SL tablet Place 1 tablet (0.4 mg total) under the tongue every 5 (five) minutes as needed.  25 tablet  6  . simvastatin (ZOCOR) 80 MG tablet Take 40 mg by mouth at bedtime.       Marland Kitchen Ubiquinol 50 MG CAPS Take 1 capsule by mouth daily. 100 mg        No current facility-administered medications on file prior to visit.    No Known Allergies  Past Medical History  Diagnosis Date  . Coronary artery disease   . MI, old     INFERIOR  . Hypertension   . Hyperlipidemia   . Chronic venous insufficiency     and varicose veins  . Anterior myocardial infarction 1990    with subsequent PTCA  . Hypercholesterolemia   . Ulcer of right leg   .  Obstructive sleep apnea   . Anemia   . Obesity     Past Surgical History  Procedure Laterality Date  . Cardiac catheterization  06/30/2003    EF 50%  . Coronary artery bypass graft  01/2003    X6. LIMA GRAFT TO THE LDA, FREE RADICAL GRAFT TO THE OM1, SEQUENTIAL SAPHENOUS VEIN GRAFT TO THE FIRST AND SECOND DIAGONAL BRANCES, AND A SEQUENTIAL  VEIN GRAFT TO THE PDA AND POSTERIOR LATERAL BRANCHES OF THE RIGHT CORONARY  . Coronary angioplasty  06/2003    POSTERIOR LATERAL BRANCH  . Tonsillectomy and adenoidectomy    . Cardiovascular stress test  01/23/2009    EF 44%    History  Smoking status  . Never Smoker   Smokeless tobacco  . Not on file    History  Alcohol Use No    Family History  Problem Relation Age of Onset  . Cancer Sister     Review of Systems: The review of systems is positive for chronic lower extremity swelling. He wears support hose. He does have skin discoloration but has had no ulceration. All other  systems were reviewed and are negative.  Physical Exam: BP 124/80  Pulse 64  Ht 6\' 2"  (1.88 m)  Wt 274 lb 1.9 oz (124.34 kg)  BMI 35.18 kg/m2  SpO2 96% The patient is alert and oriented x 3.  The mood and affect are normal.  The skin is warm and dry.  Color is normal.  The HEENT exam is normal. The carotids are 2+ without bruits.  There is no thyromegaly.  There is no JVD.  The lungs are clear.    The heart exam reveals a regular rate with a normal S1 and S2.  There are no murmurs, gallops, or rubs.  The PMI is not displaced.   Abdominal exam reveals good bowel sounds.   Lower extremities reveal chronic edema with support hose in place. He has hyperpigmentation of the skin. Cranial nerves II - XII are intact.  Motor and sensory functions are intact.  The gait is normal.  LABORATORY DATA: Stress Myoview study on 04/06/2012 showed apical and inferior wall scar without ischemia. Ejection fraction was 45%. ECG today demonstrates normal sinus rhythm with left axis  deviation and low voltage. There is evidence of old inferior and anterior infarcts. This is unchanged from April 2013.  Assessment / Plan: 1. Coronary disease with remote myocardial infarction. Status post CABG in 2004. Last Myoview showed no ischemia. Continue with medical management.  2. Hypertension, well controlled.  3. Hyperlipidemia. Continue niacin and simvastatin.

## 2013-04-01 NOTE — Patient Instructions (Signed)
Continue your current therapy  I will see you in 6 months.   

## 2013-07-10 ENCOUNTER — Other Ambulatory Visit: Payer: Self-pay

## 2013-09-03 ENCOUNTER — Other Ambulatory Visit: Payer: Self-pay | Admitting: Cardiology

## 2013-10-02 ENCOUNTER — Ambulatory Visit: Payer: Medicare Other | Admitting: Cardiology

## 2013-10-04 ENCOUNTER — Ambulatory Visit: Payer: Medicare Other | Admitting: Cardiology

## 2013-10-10 ENCOUNTER — Other Ambulatory Visit: Payer: Self-pay

## 2013-10-16 ENCOUNTER — Encounter: Payer: Self-pay | Admitting: Cardiology

## 2013-10-16 ENCOUNTER — Ambulatory Visit (INDEPENDENT_AMBULATORY_CARE_PROVIDER_SITE_OTHER): Payer: Medicare Other | Admitting: Cardiology

## 2013-10-16 VITALS — BP 140/69 | HR 62 | Ht 74.0 in | Wt 270.0 lb

## 2013-10-16 DIAGNOSIS — I2510 Atherosclerotic heart disease of native coronary artery without angina pectoris: Secondary | ICD-10-CM

## 2013-10-16 DIAGNOSIS — I10 Essential (primary) hypertension: Secondary | ICD-10-CM

## 2013-10-16 DIAGNOSIS — I872 Venous insufficiency (chronic) (peripheral): Secondary | ICD-10-CM

## 2013-10-16 DIAGNOSIS — E785 Hyperlipidemia, unspecified: Secondary | ICD-10-CM

## 2013-10-16 NOTE — Patient Instructions (Signed)
You may reduce ASA to 81 mg daily  Discuss with Dr. Waynard [name] whether he thinks you need to stay on Niacin.  I will see you in 6 months.

## 2013-10-16 NOTE — Progress Notes (Signed)
[name] Date of Birth: 05/16/1935   History of Present Illness: Mr. Bedwell is seen today for followup. He has a history of coronary disease and is status post CABG in 2004. He had angioplasty of the posterior lateral branch in that same year. His last nuclear stress test in March of 2013 showed a large apical scar and large inferior wall scar without ischemia. Ejection fraction was 45%. He reports that he has been doing well. He denies any chest pain, shortness of breath, or palpitations.  He recently was treated for pharyngitis. He is just now resuming his exercise.  Current Outpatient Prescriptions on File Prior to Visit  Medication Sig Dispense Refill  . aspirin 325 MG EC tablet Take 325 mg by mouth daily.        . Cholecalciferol (VITAMIN D-3 PO) Take by mouth daily.      . Coconut Oil OIL Virgin Oil de Coco Creme (1 Tab Daily)       . KRILL OIL PO Take by mouth daily. Taking Triple Strength ( 1 Tab Daily (2 soft gel))      . metoprolol (LOPRESSOR) 50 MG tablet Take 0.5 tablets (25 mg total) by mouth 2 (two) times daily.  30 tablet  1  . Multiple Vitamin (MULTIVITAMIN) tablet Take 1 tablet by mouth daily.        . niacin (NIASPAN) 1000 MG CR tablet Take 1,000 mg by mouth at bedtime.        . nitroGLYCERIN (NITROSTAT) 0.4 MG SL tablet Place 1 tablet (0.4 mg total) under the tongue every 5 (five) minutes as needed.  25 tablet  6  . simvastatin (ZOCOR) 80 MG tablet Take 40 mg by mouth at bedtime.       Marland Kitchen Ubiquinol 50 MG CAPS Take 1 capsule by mouth daily. 100 mg        No current facility-administered medications on file prior to visit.    No Known Allergies  Past Medical History  Diagnosis Date  . Coronary artery disease   . MI, old     INFERIOR  . Hypertension   . Hyperlipidemia   . Chronic venous insufficiency     and varicose veins  . Anterior myocardial infarction 1990    with subsequent PTCA  . Hypercholesterolemia   . Ulcer of right leg   . Obstructive sleep  apnea   . Anemia   . Obesity     Past Surgical History  Procedure Laterality Date  . Cardiac catheterization  06/30/2003    EF 50%  . Coronary artery bypass graft  01/2003    X6. LIMA GRAFT TO THE LDA, FREE RADICAL GRAFT TO THE OM1, SEQUENTIAL SAPHENOUS VEIN GRAFT TO THE FIRST AND SECOND DIAGONAL BRANCES, AND A SEQUENTIAL  VEIN GRAFT TO THE PDA AND POSTERIOR LATERAL BRANCHES OF THE RIGHT CORONARY  . Coronary angioplasty  06/2003    POSTERIOR LATERAL BRANCH  . Tonsillectomy and adenoidectomy    . Cardiovascular stress test  01/23/2009    EF 44%    History  Smoking status  . Never Smoker   Smokeless tobacco  . Not on file    History  Alcohol Use No    Family History  Problem Relation Age of Onset  . Cancer Sister     Review of Systems: The review of systems is positive for occ. disformfort in his right leg at night. All other systems were reviewed and are negative.  Physical Exam: BP 140/69  Pulse 62  Ht 6\' 2"  (1.88 m)  Wt 270 lb (122.471 kg)  BMI 34.65 kg/m2 He is a well-developed white male in no acute distress. The HEENT exam is normal. The carotids are 2+ without bruits.  There is no thyromegaly.  There is no JVD.  The lungs are clear.    The heart exam reveals a regular rate with a normal S1 and S2.  There are no murmurs, gallops, or rubs.  The PMI is not displaced.   Abdominal exam reveals good bowel sounds.   Lower extremities reveal chronic edema with support hose in place. He is alert and oriented x3. Cranial nerves II - XII are intact.  Motor and sensory functions are intact.  The gait is normal.  LABORATORY DATA: Laboratory data was reviewed from February 2014. His total cholesterol was 119 with an LDL of 68. HDL was 42. Triglycerides were normal. Demonstrates were otherwise normal.  Assessment / Plan: 1. Coronary disease with remote myocardial infarction. Status post CABG in 2004. Last Myoview showed no ischemia. Continue with medical management. He questioned  me concerning the continued use of metoprolol but given his LV dysfunction I think this is appropriate long-term therapy. I recommended he may reduce aspirin to 81 mg daily.  2. Hypertension, well controlled.  3. Hyperlipidemia. In light of recent data we discussed the use of niacin in the setting of statin therapy. Given the lack of outcome data and recent randomized trials I'm not sure there is any benefit to continued use of niacin. He is going to discuss this with Dr. Waynard Edwards.

## 2013-11-06 ENCOUNTER — Other Ambulatory Visit: Payer: Self-pay | Admitting: Cardiology

## 2014-03-25 ENCOUNTER — Encounter: Payer: Self-pay | Admitting: *Deleted

## 2014-03-25 ENCOUNTER — Ambulatory Visit (INDEPENDENT_AMBULATORY_CARE_PROVIDER_SITE_OTHER): Payer: Medicare Other | Admitting: Neurology

## 2014-03-25 VITALS — BP 120/72 | HR 64 | Resp 17 | Ht 71.5 in | Wt 271.5 lb

## 2014-03-25 DIAGNOSIS — Z9989 Dependence on other enabling machines and devices: Principal | ICD-10-CM

## 2014-03-25 DIAGNOSIS — E669 Obesity, unspecified: Secondary | ICD-10-CM

## 2014-03-25 DIAGNOSIS — R0609 Other forms of dyspnea: Secondary | ICD-10-CM

## 2014-03-25 DIAGNOSIS — R0989 Other specified symptoms and signs involving the circulatory and respiratory systems: Secondary | ICD-10-CM

## 2014-03-25 DIAGNOSIS — G478 Other sleep disorders: Secondary | ICD-10-CM

## 2014-03-25 DIAGNOSIS — G4733 Obstructive sleep apnea (adult) (pediatric): Secondary | ICD-10-CM

## 2014-03-25 DIAGNOSIS — R0683 Snoring: Secondary | ICD-10-CM

## 2014-03-25 NOTE — Progress Notes (Signed)
[facility]  Provider:  Larey Seat, M D  Referring Provider: Jerlyn Ly, MD Primary Care Physician:  Jerlyn Ly, MD  Chief Complaint  Patient presents with  . New Evaluation    Room 10  . Sleep consult    HPI:  [name] is a 79 y.o. caucasian , right handed, married  male, who is seen here due to  referral from Dr. Joylene Draft for a sleep consultation.   [name] reports today that he was 12 years ago evaluated for sleep apnea at sleep med  in [place], [place]. At the time, June 2003,  he was diagnosed with obstructive sleep apnea and he was asked to return for CPAP titration. Since that time he has to do with the same machine a ResMed model S7. The CPAP of that age  has no data  download capacity. Recently he had decided to see if that makes any difference to his well-being and daytime alertness if using  the machine or not -  he reports that he could not find a difference after a night of using CPAP versus not using CPAP.  His wife stated he was not snoring any longer on CPAP , but now does " awfully loud ".   Last 12 years there have been some changes in his medical status and he has lost about 35 pounds. He had a myocardial infarction in 1990 and Dr .Martinique has kept him on ASA, now 81 mg.  In  2004 he underwent cardiac bypass surgery, and a couple of months later suffered another milder heart attack and had to be undergoing angioplasty and stent .  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 The patient likes to use his computer at late evenings and has a TV running in the background. He may sometimes fall asleep. The patient goes to bed at midnight , he promptly falls asleep, he goes 3 times to the bathroom, he has a dry mouth than, too. No headaches,  Regularly wakes up at 7 AM spontaneously, no alarm.  Once a week he will take a 15-45  minute nap, on  sunday.  The bedroom is dark, cool and quiet. He sleeps alone , his wife can hear him next door, snoring. .   Review of Systems: Out of a complete 14 system review, the patient complains of only the following symptoms, and all other reviewed systems are negative. Loud snoring - when not on CPAP.   History   Social History  . Marital Status: Married    Spouse Name: Izora Gala    Number of Children: 3  . Years of Education: College   Occupational History  . [employer] retired    Social History Main Topics  . Smoking status: Never Smoker   . Smokeless tobacco: Never Used  . Alcohol Use: No  . Drug Use: No  . Sexual Activity: Not on file   Other Topics Concern  . Not on file   Social History Narrative   Patient is married Izora Gala).   Patient has three children.   Patient has a college education.   Patient is retired.   Patient drinks very little caffeine.   Patient is right-handed.          Family History  Problem Relation Age of Onset  . Cancer Sister   . Heart disease    . Diabetes  Brother     Past Medical History  Diagnosis Date  . Coronary artery disease   . MI, old     INFERIOR  . Hypertension   . Hyperlipidemia   . Chronic venous insufficiency     and varicose veins  . Anterior myocardial infarction 1990    with subsequent PTCA  . Hypercholesterolemia   . Ulcer of right leg   . Obstructive sleep apnea   . Anemia   . Obesity     Past Surgical History  Procedure Laterality Date  . Cardiac catheterization  06/30/2003    EF 50%  . Coronary artery bypass graft  01/2003    X6. LIMA GRAFT TO THE LDA, FREE RADICAL GRAFT TO THE OM1, SEQUENTIAL SAPHENOUS VEIN GRAFT TO THE FIRST AND SECOND DIAGONAL BRANCES, AND A SEQUENTIAL  VEIN GRAFT TO THE PDA AND POSTERIOR LATERAL BRANCHES OF THE RIGHT CORONARY  . Coronary angioplasty  06/2003    POSTERIOR LATERAL BRANCH  . Tonsillectomy and adenoidectomy    . Cardiovascular stress test  01/23/2009    EF 44%    Current  Outpatient Prescriptions  Medication Sig Dispense Refill  . Cholecalciferol (VITAMIN D-3 PO) Take by mouth daily.      . Coconut Oil OIL Virgin Oil de Coco Creme (1 Tab Daily)       . KRILL OIL PO Take by mouth daily. Taking Triple Strength ( 1 Tab Daily (2 soft gel))      . metoprolol (LOPRESSOR) 50 MG tablet TAKE 1/2 TABLET BY MOUTH TWICE DAILY  30 tablet  6  . Multiple Vitamin (MULTIVITAMIN) tablet Take 1 tablet by mouth daily.        . niacin (NIASPAN) 1000 MG CR tablet Take 1,000 mg by mouth at bedtime.        . nitroGLYCERIN (NITROSTAT) 0.4 MG SL tablet Place 1 tablet (0.4 mg total) under the tongue every 5 (five) minutes as needed.  25 tablet  6  . PREVNAR 13 SUSP injection       . simvastatin (ZOCOR) 80 MG tablet Take 40 mg by mouth at bedtime.       Marland Kitchen Ubiquinol 50 MG CAPS Take 1 capsule by mouth daily. 100 mg        No current facility-administered medications for this visit.    Allergies as of 03/25/2014  . (No Known Allergies)    Vitals: BP 120/72  Pulse 64  Resp 17  Ht 5' 11.5" (1.816 m)  Wt 271 lb 8 oz (123.152 kg)  BMI 37.34 kg/m2 Last Weight:  Wt Readings from Last 1 Encounters:  03/25/14 271 lb 8 oz (123.152 kg)   Last Height:   Ht Readings from Last 1 Encounters:  03/25/14 5' 11.5" (1.816 m)    Physical exam:  General: The patient is awake, alert and appears not in acute distress. The patient is well groomed. Head: Normocephalic, atraumatic. Neck is supple. Mallampati 3,  Very elongated uvula ,  No-nasal obstruction, neck circumference:19 inches. Cardiovascular:  Regular rate and rhythm , without  murmurs or carotid bruit, and without distended neck veins. Respiratory: Lungs are clear to auscultation. Skin:  Without evidence of edema, or rash Trunk: BMI is  elevated ,  patient  has normal posture.  Neurologic exam : The patient is awake and alert, oriented to place and time.  Memory subjective  described as intact.  There is a normal attention span &  concentration ability. Speech is fluent without  dysarthria, dysphonia  or aphasia. Mood and affect are appropriate.  Cranial nerves: Pupils are equal and briskly reactive to light. Funduscopic exam without  evidence of pallor or edema.  Extraocular movements  in vertical and horizontal planes intact and without nystagmus. Visual fields by finger perimetry are intact. Hearing to finger rub intact.  Facial sensation intact to fine touch. Facial motor strength is symmetric and tongue and uvula move midline.  Motor exam:   Normal tone and normal muscle bulk and symmetric normal strength in all extremities. He has overcome MYOSITIS due to statins.   Sensory:  Fine touch, pinprick and vibration were normal, he has less vibration sense below the left ankle.  Coordination: Rapid alternating movements in the fingers/hands is tested and normal. Finger-to-nose maneuver tested and normal without evidence of ataxia, dysmetria or tremor.  Gait and station: Patient walks without assistive device . Deep tendon reflexes: in the  upper and lower extremities are symmetric and intact. Babinski maneuver response is downgoing.   Assessment:  After physical and neurologic examination, review of laboratory studies, imaging, neurophysiology testing and pre-existing records, assessment is   SPLIT study to evaluate for OSA versus UARS, CPAP likely the only applicable treatment based on cardiac history.   Plan:  Treatment plan and additional workup : SPLIT AHI 15 and score at 4 %. Nocturia , dry mouth,  Rhinitis.

## 2014-03-25 NOTE — Patient Instructions (Signed)

## 2014-04-24 ENCOUNTER — Ambulatory Visit (INDEPENDENT_AMBULATORY_CARE_PROVIDER_SITE_OTHER): Payer: Medicare Other | Admitting: Cardiology

## 2014-04-24 ENCOUNTER — Encounter: Payer: Self-pay | Admitting: Cardiology

## 2014-04-24 VITALS — BP 122/80 | HR 58 | Ht 71.5 in | Wt 272.8 lb

## 2014-04-24 DIAGNOSIS — I252 Old myocardial infarction: Secondary | ICD-10-CM

## 2014-04-24 DIAGNOSIS — E785 Hyperlipidemia, unspecified: Secondary | ICD-10-CM

## 2014-04-24 DIAGNOSIS — I10 Essential (primary) hypertension: Secondary | ICD-10-CM

## 2014-04-24 DIAGNOSIS — I2510 Atherosclerotic heart disease of native coronary artery without angina pectoris: Secondary | ICD-10-CM

## 2014-04-24 NOTE — Patient Instructions (Signed)
Continue your current therapy  I will see you in 6 months.   

## 2014-04-24 NOTE — Progress Notes (Signed)
[name] Date of Birth: 1935-05-27   History of Present Illness: [name] is seen today for followup. He has a history of coronary disease and is status post CABG in 2004. He had angioplasty of the posterior lateral branch in that same year. His last nuclear stress test in March of 2013 showed a large apical scar and large inferior wall scar without ischemia. Ejection fraction was 45%. He reports that he has been doing well. He denies any chest pain, shortness of breath, or palpitations. He did slow down on his exercise this winter but is planning to resume.   Current Outpatient Prescriptions on File Prior to Visit  Medication Sig Dispense Refill  . Cholecalciferol (VITAMIN D-3 PO) Take by mouth daily.      . Coconut Oil OIL Virgin Oil de Coco Creme (1 Tab Daily)       . KRILL OIL PO Take by mouth daily. Taking Triple Strength ( 1 Tab Daily (2 soft gel))      . metoprolol (LOPRESSOR) 50 MG tablet TAKE 1/2 TABLET BY MOUTH TWICE DAILY  30 tablet  6  . Multiple Vitamin (MULTIVITAMIN) tablet Take 1 tablet by mouth daily.        . niacin (NIASPAN) 1000 MG CR tablet Take 1,000 mg by mouth at bedtime.        . nitroGLYCERIN (NITROSTAT) 0.4 MG SL tablet Place 1 tablet (0.4 mg total) under the tongue every 5 (five) minutes as needed.  25 tablet  6  . PREVNAR 13 SUSP injection       . simvastatin (ZOCOR) 80 MG tablet Take 40 mg by mouth at bedtime.       Marland Kitchen Ubiquinol 50 MG CAPS Take 1 capsule by mouth daily. 200 MG every other day       No current facility-administered medications on file prior to visit.    No Known Allergies  Past Medical History  Diagnosis Date  . Coronary artery disease   . MI, old     INFERIOR  . Hypertension   . Hyperlipidemia   . Chronic venous insufficiency     and varicose veins  . Anterior myocardial infarction 1990    with subsequent PTCA  . Hypercholesterolemia   . Ulcer of right leg   . Obstructive sleep apnea   . Anemia   . Obesity     Past  Surgical History  Procedure Laterality Date  . Cardiac catheterization  06/30/2003    EF 50%  . Coronary artery bypass graft  01/2003    X6. LIMA GRAFT TO THE LDA, FREE RADICAL GRAFT TO THE OM1, SEQUENTIAL SAPHENOUS VEIN GRAFT TO THE FIRST AND SECOND DIAGONAL BRANCES, AND A SEQUENTIAL  VEIN GRAFT TO THE PDA AND POSTERIOR LATERAL BRANCHES OF THE RIGHT CORONARY  . Coronary angioplasty  06/2003    POSTERIOR LATERAL BRANCH  . Tonsillectomy and adenoidectomy    . Cardiovascular stress test  01/23/2009    EF 44%    History  Smoking status  . Never Smoker   Smokeless tobacco  . Never Used    History  Alcohol Use No    Family History  Problem Relation Age of Onset  . Cancer Sister   . Heart disease    . Diabetes Brother     Review of Systems: The review of systems is positive for occ. disformfort in his right leg at night. All other systems were reviewed and are negative.  Physical Exam: BP 122/80  Pulse 58  Ht 5' 11.5" (1.816 m)  Wt 272 lb 12.8 oz (123.741 kg)  BMI 37.52 kg/m2  SpO2 98% He is a well-developed white male in no acute distress. The HEENT exam is normal. The carotids are 2+ without bruits.  There is no thyromegaly.  There is no JVD.  The lungs are clear.    The heart exam reveals a regular rate with a normal S1 and S2.  There are no murmurs, gallops, or rubs.  The PMI is not displaced.   Abdominal exam reveals good bowel sounds.   Lower extremities reveal chronic edema with support hose in place. He is alert and oriented x3. Cranial nerves II - XII are intact.  Motor and sensory functions are intact.  The gait is normal.  LABORATORY DATA: Laboratory data was reviewed from March 2015. His total cholesterol was 117 with an LDL of 63. HDL was 45. Triglycerides were 47. Complete chemistries and CBC were otherwise normal.  Ecg was unchanged from 4/14.  Assessment / Plan: 1. Coronary disease with remote myocardial infarction. Status post CABG in 2004. Last Myoview showed  no ischemia. Continue with medical management. Continue current therapy.  2. Hypertension, well controlled.  3. Hyperlipidemia. Continue statin. He has elected to stay on niacin now.  I will follow up in 6 months.

## 2014-05-02 ENCOUNTER — Ambulatory Visit (INDEPENDENT_AMBULATORY_CARE_PROVIDER_SITE_OTHER): Payer: Medicare Other | Admitting: Neurology

## 2014-05-02 DIAGNOSIS — E669 Obesity, unspecified: Secondary | ICD-10-CM

## 2014-05-02 DIAGNOSIS — Z9989 Dependence on other enabling machines and devices: Principal | ICD-10-CM

## 2014-05-02 DIAGNOSIS — G478 Other sleep disorders: Secondary | ICD-10-CM

## 2014-05-02 DIAGNOSIS — R0683 Snoring: Secondary | ICD-10-CM

## 2014-05-02 DIAGNOSIS — G4733 Obstructive sleep apnea (adult) (pediatric): Secondary | ICD-10-CM

## 2014-05-13 ENCOUNTER — Other Ambulatory Visit: Payer: Self-pay | Admitting: *Deleted

## 2014-05-13 ENCOUNTER — Telehealth: Payer: Self-pay | Admitting: Neurology

## 2014-05-13 DIAGNOSIS — G4733 Obstructive sleep apnea (adult) (pediatric): Secondary | ICD-10-CM

## 2014-05-13 NOTE — Telephone Encounter (Signed)
I called and left a message for the patient about his recent sleep study results. I informed the patient that the study revealed obstructive sleep apnea and he did well on CPAP. Dr. Brett Fairy has placed and order for new CPAP settings and we need to know which durable medical equipment company he has been using for his CPAP supplies.  I will fax a copy of the report to Dr. Crist Infante and Dr. Peter Martinique offices. I will mail a copy to the patient along with a follow up instruction letter.

## 2014-05-14 ENCOUNTER — Encounter: Payer: Self-pay | Admitting: *Deleted

## 2014-05-14 NOTE — Telephone Encounter (Signed)
Patient called back and stated he would like for his CPAP order to fax to [facility].

## 2014-06-03 ENCOUNTER — Other Ambulatory Visit (HOSPITAL_COMMUNITY): Payer: Self-pay | Admitting: Internal Medicine

## 2014-06-03 ENCOUNTER — Ambulatory Visit (HOSPITAL_COMMUNITY)
Admission: RE | Admit: 2014-06-03 | Discharge: 2014-06-03 | Disposition: A | Discharge status: Home / Self Care | Payer: Medicare Other | Source: Ambulatory Visit | Attending: Vascular Surgery | Admitting: Vascular Surgery

## 2014-06-03 DIAGNOSIS — I809 Phlebitis and thrombophlebitis of unspecified site: Secondary | ICD-10-CM

## 2014-06-10 ENCOUNTER — Other Ambulatory Visit: Payer: Self-pay

## 2014-06-10 MED ORDER — METOPROLOL TARTRATE 50 MG PO TABS: 25.0000 mg | ORAL_TABLET | Freq: Two times a day (BID) | ORAL | Status: DC

## 2014-06-10 NOTE — Telephone Encounter (Signed)
metoprolol (LOPRESSOR) 50 MG tablet  TAKE 1/2 TABLET BY MOUTH TWICE DAILY   30 tablet   6     Patient Instructions      Continue your current therapy I will see you in 6 months.  Peter M Martinique, MD at 04/24/2014  9:24 AM

## 2014-07-28 ENCOUNTER — Encounter: Payer: Self-pay | Admitting: Gastroenterology

## 2014-08-05 DIAGNOSIS — D496 Neoplasm of unspecified behavior of brain: Secondary | ICD-10-CM

## 2014-08-05 HISTORY — DX: Neoplasm of unspecified behavior of brain: D49.6

## 2014-08-15 ENCOUNTER — Other Ambulatory Visit: Payer: Self-pay | Admitting: *Deleted

## 2014-08-15 DIAGNOSIS — I8001 Phlebitis and thrombophlebitis of superficial vessels of right lower extremity: Secondary | ICD-10-CM

## 2014-08-18 ENCOUNTER — Other Ambulatory Visit (HOSPITAL_COMMUNITY): Payer: Self-pay | Admitting: Neurosurgery

## 2014-08-18 ENCOUNTER — Encounter (HOSPITAL_COMMUNITY): Payer: Self-pay | Admitting: Pharmacy Technician

## 2014-08-18 DIAGNOSIS — D496 Neoplasm of unspecified behavior of brain: Secondary | ICD-10-CM

## 2014-08-19 ENCOUNTER — Encounter (HOSPITAL_COMMUNITY): Payer: Self-pay | Admitting: *Deleted

## 2014-08-19 ENCOUNTER — Ambulatory Visit (HOSPITAL_COMMUNITY)
Admission: RE | Admit: 2014-08-19 | Discharge: 2014-08-19 | Disposition: A | Discharge status: Home / Self Care | Payer: Medicare Other | Source: Ambulatory Visit | Attending: Neurosurgery | Admitting: Neurosurgery

## 2014-08-19 DIAGNOSIS — D496 Neoplasm of unspecified behavior of brain: Secondary | ICD-10-CM

## 2014-08-19 LAB — CREATININE, SERUM
CREATININE: 1.06 mg/dL (ref 0.50–1.35)
GFR calc Af Amer: 76 mL/min — ABNORMAL LOW (ref >90)
GFR calc non Af Amer: 66 mL/min — ABNORMAL LOW (ref >90)

## 2014-08-19 MED ORDER — GADOBENATE DIMEGLUMINE 529 MG/ML IV SOLN: 20.0000 mL | Freq: Once | INTRAVENOUS | Status: AC | PRN

## 2014-08-20 ENCOUNTER — Inpatient Hospital Stay (HOSPITAL_COMMUNITY): Payer: Medicare Other

## 2014-08-20 ENCOUNTER — Encounter (HOSPITAL_COMMUNITY): Payer: Self-pay | Admitting: *Deleted

## 2014-08-20 ENCOUNTER — Inpatient Hospital Stay (HOSPITAL_COMMUNITY)
Admission: RE | Admit: 2014-08-20 | Discharge: 2014-08-27 | DRG: 026 | Disposition: A | Discharge status: Rehabilitation Facility | Payer: Medicare Other | Source: Ambulatory Visit | Attending: Neurosurgery | Admitting: Neurosurgery

## 2014-08-20 ENCOUNTER — Inpatient Hospital Stay (HOSPITAL_COMMUNITY): Payer: Medicare Other | Admitting: Certified Registered Nurse Anesthetist

## 2014-08-20 ENCOUNTER — Encounter (HOSPITAL_COMMUNITY)
Admission: RE | Disposition: A | Discharge status: Rehabilitation Facility | Payer: Self-pay | Source: Ambulatory Visit | Attending: Neurosurgery

## 2014-08-20 ENCOUNTER — Encounter (HOSPITAL_COMMUNITY): Payer: Medicare Other | Admitting: Certified Registered Nurse Anesthetist

## 2014-08-20 DIAGNOSIS — J9819 Other pulmonary collapse: Secondary | ICD-10-CM | POA: Diagnosis not present

## 2014-08-20 DIAGNOSIS — I2510 Atherosclerotic heart disease of native coronary artery without angina pectoris: Secondary | ICD-10-CM | POA: Diagnosis present

## 2014-08-20 DIAGNOSIS — Z6833 Body mass index (BMI) 33.0-33.9, adult: Secondary | ICD-10-CM

## 2014-08-20 DIAGNOSIS — I872 Venous insufficiency (chronic) (peripheral): Secondary | ICD-10-CM | POA: Diagnosis present

## 2014-08-20 DIAGNOSIS — Z951 Presence of aortocoronary bypass graft: Secondary | ICD-10-CM | POA: Diagnosis not present

## 2014-08-20 DIAGNOSIS — Z7982 Long term (current) use of aspirin: Secondary | ICD-10-CM

## 2014-08-20 DIAGNOSIS — K219 Gastro-esophageal reflux disease without esophagitis: Secondary | ICD-10-CM | POA: Diagnosis present

## 2014-08-20 DIAGNOSIS — E669 Obesity, unspecified: Secondary | ICD-10-CM | POA: Diagnosis present

## 2014-08-20 DIAGNOSIS — Z23 Encounter for immunization: Secondary | ICD-10-CM | POA: Diagnosis not present

## 2014-08-20 DIAGNOSIS — E785 Hyperlipidemia, unspecified: Secondary | ICD-10-CM | POA: Diagnosis present

## 2014-08-20 DIAGNOSIS — I255 Ischemic cardiomyopathy: Secondary | ICD-10-CM

## 2014-08-20 DIAGNOSIS — Z9861 Coronary angioplasty status: Secondary | ICD-10-CM | POA: Diagnosis not present

## 2014-08-20 DIAGNOSIS — D496 Neoplasm of unspecified behavior of brain: Secondary | ICD-10-CM

## 2014-08-20 DIAGNOSIS — I10 Essential (primary) hypertension: Secondary | ICD-10-CM | POA: Diagnosis present

## 2014-08-20 DIAGNOSIS — I252 Old myocardial infarction: Secondary | ICD-10-CM

## 2014-08-20 DIAGNOSIS — C712 Malignant neoplasm of temporal lobe: Principal | ICD-10-CM | POA: Diagnosis present

## 2014-08-20 DIAGNOSIS — R9431 Abnormal electrocardiogram [ECG] [EKG]: Secondary | ICD-10-CM

## 2014-08-20 DIAGNOSIS — R4182 Altered mental status, unspecified: Secondary | ICD-10-CM | POA: Diagnosis present

## 2014-08-20 DIAGNOSIS — I498 Other specified cardiac arrhythmias: Secondary | ICD-10-CM | POA: Diagnosis not present

## 2014-08-20 DIAGNOSIS — I442 Atrioventricular block, complete: Secondary | ICD-10-CM

## 2014-08-20 DIAGNOSIS — C719 Malignant neoplasm of brain, unspecified: Secondary | ICD-10-CM | POA: Diagnosis present

## 2014-08-20 DIAGNOSIS — Z833 Family history of diabetes mellitus: Secondary | ICD-10-CM

## 2014-08-20 DIAGNOSIS — F489 Nonpsychotic mental disorder, unspecified: Secondary | ICD-10-CM | POA: Diagnosis present

## 2014-08-20 DIAGNOSIS — G4733 Obstructive sleep apnea (adult) (pediatric): Secondary | ICD-10-CM | POA: Diagnosis present

## 2014-08-20 DIAGNOSIS — I25810 Atherosclerosis of coronary artery bypass graft(s) without angina pectoris: Secondary | ICD-10-CM

## 2014-08-20 HISTORY — PX: CRANIOTOMY: SHX93

## 2014-08-20 LAB — COMPREHENSIVE METABOLIC PANEL
ALK PHOS: 52 U/L (ref 39–117)
ALT: 14 U/L (ref 0–53)
AST: 15 U/L (ref 0–37)
Albumin: 2.9 g/dL — ABNORMAL LOW (ref 3.5–5.2)
Anion gap: 16 — ABNORMAL HIGH (ref 5–15)
BUN: 16 mg/dL (ref 6–23)
CO2: 21 mEq/L (ref 19–32)
Calcium: 8.3 mg/dL — ABNORMAL LOW (ref 8.4–10.5)
Chloride: 100 mEq/L (ref 96–112)
Creatinine, Ser: 0.91 mg/dL (ref 0.50–1.35)
GFR calc non Af Amer: 80 mL/min — ABNORMAL LOW (ref >90)
GLUCOSE: 202 mg/dL — AB (ref 70–99)
POTASSIUM: 3.9 meq/L (ref 3.7–5.3)
Sodium: 137 mEq/L (ref 137–147)
Total Bilirubin: 0.3 mg/dL (ref 0.3–1.2)
Total Protein: 5.4 g/dL — ABNORMAL LOW (ref 6.0–8.3)

## 2014-08-20 LAB — TYPE AND SCREEN
ABO/RH(D): A NEG
Antibody Screen: NEGATIVE

## 2014-08-20 LAB — BASIC METABOLIC PANEL
Anion gap: 14 (ref 5–15)
BUN: 16 mg/dL (ref 6–23)
CALCIUM: 8.9 mg/dL (ref 8.4–10.5)
CO2: 21 mEq/L (ref 19–32)
Chloride: 102 mEq/L (ref 96–112)
Creatinine, Ser: 0.98 mg/dL (ref 0.50–1.35)
GFR calc Af Amer: 90 mL/min — ABNORMAL LOW (ref >90)
GFR, EST NON AFRICAN AMERICAN: 77 mL/min — AB (ref >90)
GLUCOSE: 114 mg/dL — AB (ref 70–99)
Potassium: 3.8 mEq/L (ref 3.7–5.3)
SODIUM: 137 meq/L (ref 137–147)

## 2014-08-20 LAB — MRSA PCR SCREENING: MRSA by PCR: NEGATIVE

## 2014-08-20 LAB — ABO/RH: ABO/RH(D): A NEG

## 2014-08-20 LAB — CBC
HCT: 39.8 % (ref 39.0–52.0)
Hemoglobin: 13.9 g/dL (ref 13.0–17.0)
MCH: 29.7 pg (ref 26.0–34.0)
MCHC: 34.9 g/dL (ref 30.0–36.0)
MCV: 85 fL (ref 78.0–100.0)
PLATELETS: 259 10*3/uL (ref 150–400)
RBC: 4.68 MIL/uL (ref 4.22–5.81)
RDW: 13.4 % (ref 11.5–15.5)
WBC: 10.4 10*3/uL (ref 4.0–10.5)

## 2014-08-20 LAB — MAGNESIUM: Magnesium: 1.8 mg/dL (ref 1.5–2.5)

## 2014-08-20 SURGERY — CRANIOTOMY TUMOR EXCISION
Anesthesia: General | Laterality: Right

## 2014-08-20 MED ORDER — PANTOPRAZOLE SODIUM 40 MG IV SOLR
40.0000 mg | Freq: Every day | INTRAVENOUS | Status: DC
  Administered 2014-08-20 – 2014-08-21 (×2): 40 mg via INTRAVENOUS
  Filled 2014-08-20 (×3): qty 40

## 2014-08-20 MED ORDER — MICROFIBRILLAR COLL HEMOSTAT EX PADS
MEDICATED_PAD | CUTANEOUS | Status: DC | PRN
  Administered 2014-08-20: 1 via TOPICAL

## 2014-08-20 MED ORDER — GLYCOPYRROLATE 0.2 MG/ML IJ SOLN
INTRAMUSCULAR | Status: AC
  Filled 2014-08-20: qty 3

## 2014-08-20 MED ORDER — ONDANSETRON HCL 4 MG/2ML IJ SOLN
INTRAMUSCULAR | Status: AC
  Administered 2014-08-20: 4 mg via INTRAVENOUS
  Filled 2014-08-20: qty 2

## 2014-08-20 MED ORDER — NEOSTIGMINE METHYLSULFATE 10 MG/10ML IV SOLN
INTRAVENOUS | Status: AC
  Filled 2014-08-20: qty 1

## 2014-08-20 MED ORDER — SODIUM CHLORIDE 0.9 % IR SOLN
Status: DC | PRN
  Administered 2014-08-20: 15:00:00

## 2014-08-20 MED ORDER — CEFAZOLIN SODIUM-DEXTROSE 2-3 GM-% IV SOLR
INTRAVENOUS | Status: AC
  Filled 2014-08-20: qty 50

## 2014-08-20 MED ORDER — NEOSTIGMINE METHYLSULFATE 10 MG/10ML IV SOLN
INTRAVENOUS | Status: DC | PRN
  Administered 2014-08-20: 5 mg via INTRAVENOUS

## 2014-08-20 MED ORDER — DEXAMETHASONE SODIUM PHOSPHATE 10 MG/ML IJ SOLN
6.0000 mg | Freq: Four times a day (QID) | INTRAMUSCULAR | Status: AC
  Administered 2014-08-20 – 2014-08-21 (×4): 6 mg via INTRAVENOUS
  Filled 2014-08-20 (×2): qty 1
  Filled 2014-08-20 (×2): qty 0.6

## 2014-08-20 MED ORDER — PROMETHAZINE HCL 25 MG PO TABS: 12.5000 mg | ORAL_TABLET | ORAL | Status: DC | PRN

## 2014-08-20 MED ORDER — GLYCOPYRROLATE 0.2 MG/ML IJ SOLN
INTRAMUSCULAR | Status: DC | PRN
  Administered 2014-08-20: 0.6 mg via INTRAVENOUS

## 2014-08-20 MED ORDER — OXYCODONE HCL 5 MG PO TABS: 5.0000 mg | ORAL_TABLET | Freq: Once | ORAL | Status: DC | PRN

## 2014-08-20 MED ORDER — LIDOCAINE HCL (CARDIAC) 20 MG/ML IV SOLN
INTRAVENOUS | Status: DC | PRN
  Administered 2014-08-20: 50 mg via INTRAVENOUS

## 2014-08-20 MED ORDER — METOPROLOL TARTRATE 25 MG PO TABS
25.0000 mg | ORAL_TABLET | Freq: Two times a day (BID) | ORAL | Status: DC
  Administered 2014-08-21 – 2014-08-27 (×13): 25 mg via ORAL
  Filled 2014-08-20 (×15): qty 1

## 2014-08-20 MED ORDER — EPHEDRINE SULFATE 50 MG/ML IJ SOLN
INTRAMUSCULAR | Status: AC
  Filled 2014-08-20: qty 1

## 2014-08-20 MED ORDER — CEFAZOLIN SODIUM-DEXTROSE 2-3 GM-% IV SOLR
INTRAVENOUS | Status: DC | PRN
  Administered 2014-08-20: 2 g via INTRAVENOUS

## 2014-08-20 MED ORDER — SODIUM CHLORIDE 0.9 % IV SOLN
INTRAVENOUS | Status: DC | PRN
  Administered 2014-08-20 (×2): via INTRAVENOUS

## 2014-08-20 MED ORDER — LACTATED RINGERS IV SOLN
INTRAVENOUS | Status: DC
  Administered 2014-08-20: 12:00:00 via INTRAVENOUS

## 2014-08-20 MED ORDER — ONDANSETRON HCL 4 MG/2ML IJ SOLN
INTRAMUSCULAR | Status: AC
Start: 2014-08-20 — End: 2014-08-20
  Filled 2014-08-20: qty 2

## 2014-08-20 MED ORDER — ATORVASTATIN CALCIUM 40 MG PO TABS
40.0000 mg | ORAL_TABLET | Freq: Every day | ORAL | Status: DC
  Administered 2014-08-21 – 2014-08-26 (×6): 40 mg via ORAL
  Filled 2014-08-20 (×6): qty 1

## 2014-08-20 MED ORDER — LEVETIRACETAM IN NACL 500 MG/100ML IV SOLN
500.0000 mg | Freq: Two times a day (BID) | INTRAVENOUS | Status: DC
  Administered 2014-08-20 – 2014-08-22 (×4): 500 mg via INTRAVENOUS
  Filled 2014-08-20 (×5): qty 100

## 2014-08-20 MED ORDER — MORPHINE SULFATE 2 MG/ML IJ SOLN
1.0000 mg | INTRAMUSCULAR | Status: DC | PRN
  Administered 2014-08-21 (×3): 2 mg via INTRAVENOUS
  Filled 2014-08-20 (×3): qty 1

## 2014-08-20 MED ORDER — PROPOFOL 10 MG/ML IV BOLUS
INTRAVENOUS | Status: AC
  Filled 2014-08-20: qty 20

## 2014-08-20 MED ORDER — CEFAZOLIN SODIUM-DEXTROSE 2-3 GM-% IV SOLR
2.0000 g | Freq: Three times a day (TID) | INTRAVENOUS | Status: AC
  Administered 2014-08-20 – 2014-08-21 (×2): 2 g via INTRAVENOUS
  Filled 2014-08-20 (×2): qty 50

## 2014-08-20 MED ORDER — ONDANSETRON HCL 4 MG/2ML IJ SOLN
INTRAMUSCULAR | Status: DC | PRN
  Administered 2014-08-20: 4 mg via INTRAVENOUS

## 2014-08-20 MED ORDER — BUPIVACAINE-EPINEPHRINE 0.5% -1:200000 IJ SOLN
INTRAMUSCULAR | Status: DC | PRN
  Administered 2014-08-20: 20 mL

## 2014-08-20 MED ORDER — FENTANYL CITRATE 0.05 MG/ML IJ SOLN
INTRAMUSCULAR | Status: AC
  Filled 2014-08-20: qty 5

## 2014-08-20 MED ORDER — DEXAMETHASONE SODIUM PHOSPHATE 10 MG/ML IJ SOLN
INTRAMUSCULAR | Status: AC
  Filled 2014-08-20: qty 2

## 2014-08-20 MED ORDER — HEMOSTATIC AGENTS (NO CHARGE) OPTIME
TOPICAL | Status: DC | PRN
  Administered 2014-08-20: 1 via TOPICAL

## 2014-08-20 MED ORDER — DEXAMETHASONE SODIUM PHOSPHATE 4 MG/ML IJ SOLN
4.0000 mg | Freq: Four times a day (QID) | INTRAMUSCULAR | Status: AC
  Administered 2014-08-22 (×4): 4 mg via INTRAVENOUS
  Filled 2014-08-20 (×6): qty 1

## 2014-08-20 MED ORDER — ACETAMINOPHEN 325 MG PO TABS
650.0000 mg | ORAL_TABLET | ORAL | Status: DC | PRN
Start: 2014-08-20 — End: 2014-08-27
  Administered 2014-08-27: 650 mg via ORAL
  Filled 2014-08-20: qty 2

## 2014-08-20 MED ORDER — SODIUM CHLORIDE 0.9 % IV SOLN
INTRAVENOUS | Status: DC | PRN
  Administered 2014-08-20 (×2): via INTRAVENOUS

## 2014-08-20 MED ORDER — DEXAMETHASONE SODIUM PHOSPHATE 4 MG/ML IJ SOLN
4.0000 mg | Freq: Three times a day (TID) | INTRAMUSCULAR | Status: DC
  Administered 2014-08-22 – 2014-08-25 (×8): 4 mg via INTRAVENOUS
  Filled 2014-08-20 (×11): qty 1

## 2014-08-20 MED ORDER — ONDANSETRON HCL 4 MG/2ML IJ SOLN: 4.0000 mg | INTRAMUSCULAR | Status: DC | PRN

## 2014-08-20 MED ORDER — ONDANSETRON HCL 4 MG/2ML IJ SOLN
4.0000 mg | Freq: Once | INTRAMUSCULAR | Status: AC | PRN
  Administered 2014-08-20: 4 mg via INTRAVENOUS

## 2014-08-20 MED ORDER — ROCURONIUM BROMIDE 100 MG/10ML IV SOLN
INTRAVENOUS | Status: DC | PRN
  Administered 2014-08-20: 10 mg via INTRAVENOUS
  Administered 2014-08-20: 50 mg via INTRAVENOUS
  Administered 2014-08-20: 30 mg via INTRAVENOUS

## 2014-08-20 MED ORDER — HYDRALAZINE HCL 20 MG/ML IJ SOLN
INTRAMUSCULAR | Status: AC
  Filled 2014-08-20: qty 1

## 2014-08-20 MED ORDER — PHENYLEPHRINE HCL 10 MG/ML IJ SOLN
INTRAMUSCULAR | Status: AC
  Filled 2014-08-20: qty 1

## 2014-08-20 MED ORDER — DEXAMETHASONE SODIUM PHOSPHATE 10 MG/ML IJ SOLN
INTRAMUSCULAR | Status: DC | PRN
  Administered 2014-08-20: 20 mg via INTRAVENOUS

## 2014-08-20 MED ORDER — LIDOCAINE HCL (CARDIAC) 20 MG/ML IV SOLN
INTRAVENOUS | Status: AC
  Filled 2014-08-20: qty 5

## 2014-08-20 MED ORDER — THROMBIN 20000 UNITS EX SOLR
CUTANEOUS | Status: DC | PRN
  Administered 2014-08-20: 15:00:00 via TOPICAL

## 2014-08-20 MED ORDER — NITROGLYCERIN 0.4 MG SL SUBL: 0.4000 mg | SUBLINGUAL_TABLET | SUBLINGUAL | Status: DC | PRN

## 2014-08-20 MED ORDER — BACITRACIN ZINC 500 UNIT/GM EX OINT
TOPICAL_OINTMENT | CUTANEOUS | Status: DC | PRN
  Administered 2014-08-20: 1 via TOPICAL

## 2014-08-20 MED ORDER — ACETAMINOPHEN 650 MG RE SUPP: 650.0000 mg | RECTAL | Status: DC | PRN

## 2014-08-20 MED ORDER — PHENYLEPHRINE 40 MCG/ML (10ML) SYRINGE FOR IV PUSH (FOR BLOOD PRESSURE SUPPORT)
PREFILLED_SYRINGE | INTRAVENOUS | Status: AC
Start: 2014-08-20 — End: 2014-08-20
  Filled 2014-08-20: qty 10

## 2014-08-20 MED ORDER — ONDANSETRON HCL 4 MG PO TABS: 4.0000 mg | ORAL_TABLET | ORAL | Status: DC | PRN

## 2014-08-20 MED ORDER — LEVETIRACETAM IN NACL 500 MG/100ML IV SOLN
500.0000 mg | INTRAVENOUS | Status: AC
  Administered 2014-08-20: 500 mg via INTRAVENOUS
  Filled 2014-08-20: qty 100

## 2014-08-20 MED ORDER — HYDROMORPHONE HCL 1 MG/ML IJ SOLN: 0.2500 mg | INTRAMUSCULAR | Status: DC | PRN

## 2014-08-20 MED ORDER — DOCUSATE SODIUM 100 MG PO CAPS
100.0000 mg | ORAL_CAPSULE | Freq: Two times a day (BID) | ORAL | Status: DC
  Administered 2014-08-21 – 2014-08-27 (×13): 100 mg via ORAL
  Filled 2014-08-20 (×14): qty 1

## 2014-08-20 MED ORDER — STERILE WATER FOR INJECTION IJ SOLN
INTRAMUSCULAR | Status: AC
  Filled 2014-08-20: qty 10

## 2014-08-20 MED ORDER — ARTIFICIAL TEARS OP OINT
TOPICAL_OINTMENT | OPHTHALMIC | Status: AC
  Filled 2014-08-20: qty 3.5

## 2014-08-20 MED ORDER — PROPOFOL 10 MG/ML IV BOLUS
INTRAVENOUS | Status: DC | PRN
  Administered 2014-08-20: 160 mg via INTRAVENOUS
  Administered 2014-08-20: 40 mg via INTRAVENOUS

## 2014-08-20 MED ORDER — FENTANYL CITRATE 0.05 MG/ML IJ SOLN
INTRAMUSCULAR | Status: DC | PRN
  Administered 2014-08-20 (×2): 50 ug via INTRAVENOUS
  Administered 2014-08-20: 100 ug via INTRAVENOUS
  Administered 2014-08-20 (×4): 50 ug via INTRAVENOUS
  Administered 2014-08-20: 100 ug via INTRAVENOUS

## 2014-08-20 MED ORDER — LABETALOL HCL 5 MG/ML IV SOLN
10.0000 mg | INTRAVENOUS | Status: DC | PRN
  Administered 2014-08-21: 20 mg via INTRAVENOUS
  Filled 2014-08-20: qty 4

## 2014-08-20 MED ORDER — OXYCODONE HCL 5 MG/5ML PO SOLN: 5.0000 mg | Freq: Once | ORAL | Status: DC | PRN

## 2014-08-20 MED ORDER — HYDRALAZINE HCL 20 MG/ML IJ SOLN
INTRAMUSCULAR | Status: DC | PRN
  Administered 2014-08-20 (×3): 2 mg via INTRAVENOUS

## 2014-08-20 MED ORDER — ROCURONIUM BROMIDE 50 MG/5ML IV SOLN
INTRAVENOUS | Status: AC
  Filled 2014-08-20: qty 1

## 2014-08-20 MED ORDER — HYDROCODONE-ACETAMINOPHEN 5-325 MG PO TABS
1.0000 | ORAL_TABLET | ORAL | Status: DC | PRN
  Administered 2014-08-21 – 2014-08-24 (×5): 1 via ORAL
  Filled 2014-08-20 (×5): qty 1

## 2014-08-20 MED ORDER — NIACIN ER (ANTIHYPERLIPIDEMIC) 1000 MG PO TBCR
1000.0000 mg | EXTENDED_RELEASE_TABLET | Freq: Every day | ORAL | Status: DC
  Administered 2014-08-21 – 2014-08-26 (×6): 1000 mg via ORAL
  Filled 2014-08-20 (×9): qty 1

## 2014-08-20 MED ORDER — POTASSIUM CHLORIDE IN NACL 20-0.9 MEQ/L-% IV SOLN
INTRAVENOUS | Status: DC
  Administered 2014-08-20: 22:00:00 via INTRAVENOUS
  Administered 2014-08-21: 75 mL/h via INTRAVENOUS
  Administered 2014-08-21 – 2014-08-25 (×2): via INTRAVENOUS
  Filled 2014-08-20 (×13): qty 1000

## 2014-08-20 SURGICAL SUPPLY — 88 items
BAG DECANTER FOR FLEXI CONT (MISCELLANEOUS) ×3 IMPLANT
BIT DRILL WIRE PASS 1.3MM (BIT) IMPLANT
BLADE CLIPPER SURG NEURO (BLADE) IMPLANT
BLADE ULTRA TIP 2M (BLADE) IMPLANT
BRUSH SCRUB EZ PLAIN DRY (MISCELLANEOUS) ×3 IMPLANT
BUR ACORN 6.0 PRECISION (BURR) ×2 IMPLANT
BUR ACORN 6.0MM PRECISION (BURR) ×1
BUR ROUTER D-58 CRANI (BURR) IMPLANT
CANISTER SUCT 3000ML (MISCELLANEOUS) ×6 IMPLANT
CATH VENTRIC 35X38 W/TROCAR LG (CATHETERS) IMPLANT
CLIP TI MEDIUM 6 (CLIP) IMPLANT
CONT SPEC 4OZ CLIKSEAL STRL BL (MISCELLANEOUS) ×9 IMPLANT
CORDS BIPOLAR (ELECTRODE) ×3 IMPLANT
COVER TABLE BACK 60X90 (DRAPES) IMPLANT
DRAIN SNY WOU 7FLT (WOUND CARE) IMPLANT
DRAPE MICROSCOPE LEICA (MISCELLANEOUS) ×3 IMPLANT
DRAPE NEUROLOGICAL W/INCISE (DRAPES) ×3 IMPLANT
DRAPE SURG 17X23 STRL (DRAPES) IMPLANT
DRAPE WARM FLUID 44X44 (DRAPE) ×3 IMPLANT
DRILL WIRE PASS 1.3MM (BIT)
DRSG TELFA 3X8 NADH (GAUZE/BANDAGES/DRESSINGS) IMPLANT
ELECT CAUTERY BLADE 6.4 (BLADE) ×3 IMPLANT
ELECT REM PT RETURN 9FT ADLT (ELECTROSURGICAL) ×3
ELECTRODE REM PT RTRN 9FT ADLT (ELECTROSURGICAL) ×1 IMPLANT
EVACUATOR 1/8 PVC DRAIN (DRAIN) IMPLANT
EVACUATOR SILICONE 100CC (DRAIN) IMPLANT
FORCEPS BIPOLAR SPETZLER 8 1.0 (NEUROSURGERY SUPPLIES) ×3 IMPLANT
GAUZE SPONGE 4X4 12PLY STRL (GAUZE/BANDAGES/DRESSINGS) IMPLANT
GAUZE SPONGE 4X4 16PLY XRAY LF (GAUZE/BANDAGES/DRESSINGS) IMPLANT
GLOVE BIO SURGEON STRL SZ8 (GLOVE) ×3 IMPLANT
GLOVE BIO SURGEON STRL SZ8.5 (GLOVE) ×3 IMPLANT
GLOVE BIOGEL PI IND STRL 7.0 (GLOVE) ×2 IMPLANT
GLOVE BIOGEL PI INDICATOR 7.0 (GLOVE) ×4
GLOVE ECLIPSE 7.5 STRL STRAW (GLOVE) ×3 IMPLANT
GLOVE EXAM NITRILE LRG STRL (GLOVE) IMPLANT
GLOVE EXAM NITRILE MD LF STRL (GLOVE) IMPLANT
GLOVE EXAM NITRILE XL STR (GLOVE) IMPLANT
GLOVE EXAM NITRILE XS STR PU (GLOVE) IMPLANT
GLOVE INDICATOR 7.5 STRL GRN (GLOVE) ×6 IMPLANT
GLOVE INDICATOR 8.5 STRL (GLOVE) ×3 IMPLANT
GLOVE SS BIOGEL STRL SZ 8 (GLOVE) ×1 IMPLANT
GLOVE SUPERSENSE BIOGEL SZ 8 (GLOVE) ×2
GOWN STRL REUS W/ TWL LRG LVL3 (GOWN DISPOSABLE) IMPLANT
GOWN STRL REUS W/ TWL XL LVL3 (GOWN DISPOSABLE) ×1 IMPLANT
GOWN STRL REUS W/TWL LRG LVL3 (GOWN DISPOSABLE)
GOWN STRL REUS W/TWL XL LVL3 (GOWN DISPOSABLE) ×2
HEMOSTAT SURGICEL 2X14 (HEMOSTASIS) ×3 IMPLANT
KIT BASIN OR (CUSTOM PROCEDURE TRAY) ×3 IMPLANT
KIT DRAIN CSF ACCUDRAIN (MISCELLANEOUS) IMPLANT
KIT ROOM TURNOVER OR (KITS) ×3 IMPLANT
MARKER SKIN DUAL TIP RULER LAB (MISCELLANEOUS) IMPLANT
NEEDLE HYPO 22GX1.5 SAFETY (NEEDLE) ×3 IMPLANT
NS IRRIG 1000ML POUR BTL (IV SOLUTION) ×3 IMPLANT
PACK CRANIOTOMY (CUSTOM PROCEDURE TRAY) ×3 IMPLANT
PAD ARMBOARD 7.5X6 YLW CONV (MISCELLANEOUS) ×3 IMPLANT
PATTIES SURGICAL .25X.25 (GAUZE/BANDAGES/DRESSINGS) IMPLANT
PATTIES SURGICAL .5 X.5 (GAUZE/BANDAGES/DRESSINGS) IMPLANT
PATTIES SURGICAL .5 X3 (DISPOSABLE) IMPLANT
PATTIES SURGICAL 1X1 (DISPOSABLE) ×3 IMPLANT
PIN MAYFIELD SKULL DISP (PIN) IMPLANT
PLATE 1.5  2HOLE MED NEURO (Plate) ×8 IMPLANT
PLATE 1.5 2HOLE MED NEURO (Plate) ×4 IMPLANT
RUBBERBAND STERILE (MISCELLANEOUS) ×12 IMPLANT
SCREW SELF DRILL HT 1.5/4MM (Screw) ×24 IMPLANT
SPECIMEN JAR SMALL (MISCELLANEOUS) IMPLANT
SPONGE NEURO XRAY DETECT 1X3 (DISPOSABLE) ×6 IMPLANT
SPONGE SURGIFOAM ABS GEL 100 (HEMOSTASIS) ×3 IMPLANT
STAPLER SKIN PROX WIDE 3.9 (STAPLE) ×3 IMPLANT
STOCKINETTE 6  STRL (DRAPES)
STOCKINETTE 6 STRL (DRAPES) IMPLANT
SUT ETHILON 3 0 FSL (SUTURE) IMPLANT
SUT ETHILON 3 0 PS 1 (SUTURE) IMPLANT
SUT NURALON 4 0 TR CR/8 (SUTURE) ×6 IMPLANT
SUT PROLENE 6 0 BV (SUTURE) IMPLANT
SUT SILK 0 TIES 10X30 (SUTURE) IMPLANT
SUT VIC AB 2-0 CP2 18 (SUTURE) ×3 IMPLANT
SUT VIC AB 3-0 FS2 27 (SUTURE) IMPLANT
SUT VICRYL 4-0 PS2 18IN ABS (SUTURE) IMPLANT
SYR 20ML ECCENTRIC (SYRINGE) ×3 IMPLANT
SYR CONTROL 10ML LL (SYRINGE) ×3 IMPLANT
TAPE CLOTH SURG 4X10 WHT LF (GAUZE/BANDAGES/DRESSINGS) ×3 IMPLANT
TOWEL OR 17X24 6PK STRL BLUE (TOWEL DISPOSABLE) ×3 IMPLANT
TOWEL OR 17X26 10 PK STRL BLUE (TOWEL DISPOSABLE) ×3 IMPLANT
TRAY FOLEY CATH 16FRSI W/METER (SET/KITS/TRAYS/PACK) ×3 IMPLANT
TUBE CONNECTING 12'X1/4 (SUCTIONS) ×1
TUBE CONNECTING 12X1/4 (SUCTIONS) ×2 IMPLANT
UNDERPAD 30X30 INCONTINENT (UNDERPADS AND DIAPERS) ×3 IMPLANT
WATER STERILE IRR 1000ML POUR (IV SOLUTION) ×3 IMPLANT

## 2014-08-20 NOTE — Progress Notes (Signed)
Subjective:  The patient is somnolent but arousable. He is in no apparent distress.  Objective: Vital signs in last 24 hours: Temp:  [97.8 F (36.6 C)] 97.8 F (36.6 C) (09/16 1144) Pulse Rate:  [49] 49 (09/16 1144) Resp:  [18] 18 (09/16 1144) BP: (133)/(73) 133/73 mmHg (09/16 1144) SpO2:  [98 %] 98 % (09/16 1144) Weight:  [117.935 kg (260 lb)] 117.935 kg (260 lb) (09/16 1144)  Intake/Output from previous day:   Intake/Output this shift: Total I/O In: 2000 [I.V.:2000] Out: 475 [Urine:275; Blood:200]  Physical exam the patient is somnolent but arousable. He is moving all 4 extremities. He will answer simple questions. His pupils are small but equal. His dressing is clean and dry.  Lab Results:  Recent Labs  08/20/14 1212  WBC 10.4  HGB 13.9  HCT 39.8  PLT 259   BMET  Recent Labs  08/19/14 1051 08/20/14 1212  NA  --  137  K  --  3.8  CL  --  102  CO2  --  21  GLUCOSE  --  114*  BUN  --  16  CREATININE 1.06 0.98  CALCIUM  --  8.9    Studies/Results: Dg Chest 2 View  08/20/2014   CLINICAL DATA:  Preoperative evaluation for brain tumor resection.  EXAM: CHEST  2 VIEW  COMPARISON:  None.  FINDINGS: Sternotomy wires are intact. Lungs are adequately inflated with minimal linear density in the left base likely scarring. There is blunting of the left costophrenic angle likely pleural parenchymal scarring. There is subtle focal density along the posterior left pleural surface on the lateral film. Cardiomediastinal silhouette is within normal. There are minimal degenerative changes of the spine.  IMPRESSION: No acute cardiopulmonary disease.  Linear density left base likely atelectasis. Subtle focal density along the pleural surface of the posterior left lung base on the lateral film likely atelectasis or pleural-parenchymal scarring, however cannot exclude a parenchymal or pleural based mass. Consider noncontrast chest CT for further evaluation.   Electronically Signed   By:  Marin Olp M.D.   On: 08/20/2014 14:10   Mr Jeri Cos GU Contrast  08/19/2014   CLINICAL DATA:  Brain tumor.  EXAM: MRI HEAD WITHOUT AND WITH CONTRAST  TECHNIQUE: Multiplanar, multiecho pulse sequences of the brain and surrounding structures were obtained without and with intravenous contrast.  CONTRAST:  20 mL MultiHance  COMPARISON:  08/14/2014 brain MRI from [facility]  FINDINGS: There is no acute infarct. T2 hyperintense mass in the right temporal lobe measures approximately 5.4 x 4.4 x 4.6 cm, not significantly unchanged in size from recent outside MRI. The mass demonstrates irregular, predominantly peripheral enhancement with evidence of central necrosis. A small amount of susceptibility artifact centrally within the lesion may reflect old blood products. The mass partially compresses and medially displaces the temporal horn of the right lateral ventricle. Moderate surrounding edema does not appear significantly changed and partially extends into the right parietal white matter as well as the posterior internal and external capsules. 6 mm of leftward midline shift has not significantly changed. There is mild mass effect on the right midbrain.  No other enhancing lesions are identified. Scattered, small foci of T2 hyperintensity in the cerebral white matter elsewhere are nonspecific but compatible with mild chronic small vessel ischemic disease. There is mild to moderate cerebral atrophy. There is no extra-axial fluid collection.  Orbits are unremarkable. Tiny left maxillary sinus mucous retention cyst is noted. Mastoid air cells are  clear. Major intracranial vascular flow voids are preserved.  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   Electronically Signed   By: Logan Bores   On: 08/19/2014 14:24    Assessment/Plan: The patient is doing well.  LOS: 0 days      Eman Rynders D 08/20/2014, 6:18 PM

## 2014-08-20 NOTE — OR Nursing (Signed)
Pt. With wide complex ekg rhythm with rate in the 80s.  BP 95/40s.  Pt denies CP and SOB.  EKg ordered.  Dr. Ola Spurr notified of events.  Pt. Asymptomatic threw episodes.  Episodes of wide complex events last approximately 30 sec to 1 min in length then spontaneously resolve back to sinus rhythm with PACs.  Dr. Saintclair Halsted notified of events and EKGs.

## 2014-08-20 NOTE — Op Note (Signed)
Brief history: The patient is a 79 year old white male who presented with mental status changes. He was worked up with a brain MRI which demonstrated a large right temporal tumor. I discussed the various treatment options with the patient including surgery. I described the surgical treatment option of a right craniotomy for resection of the tumor. We discussed the risks, benefits, alternatives, and likelihood of achieving our goals with surgery. I have answered all the patient's questions. He has decided proceed with surgery.  Preop diagnosis: Right temporal brain tumor  Postop diagnosis: The same  Procedure: Right temporoparietal craniotomy for tumor resection using brain lab neuronavigation  Surgeon: Dr. Earle Gell  Assistant: Dr. Dominica Severin cram  Anesthesia: Gen. endotracheal  Estimated blood loss: 125 cc  Specimens: Tumor  Drains: None  Complications: None  Description of procedure: The patient was brought to the operating room by the anesthesia team. General endotracheal anesthesia was induced. I then applied the Mayfield 3 point headrest in a paced calvarium. His head was turned to the left exposing his right scalp. A roll was placed under the patient's right shoulder. We then registered the patient in to the brain lab neuronavigation unit. The patient's right scalp was then shaved with clippers and prepared with Betadine scrub and Betadine solution. Sterile drapes were applied. I then injected the area to be incised with Marcaine with epinephrine solution. I then used a scalpel to make an inverted U-type incision over the patient's right ear. I used Raney clips for wound edge hemostasis. I then used electrocautery to divide the temporalis fascia and muscle. We exposed the underlying calvarium with electrocautery and the periosteal elevators. I used towel clamps and rubber bands for exposure.  I then used a high-speed drill to create a right temporal burr hole. We used the footplate device  to create a craniotomy flap. I elevated the craniotomy flap with a Penfield #1 exposing the underlying dura. I incise the dura with a 15 blade scalpel and continued the incision with the Metzenbaum scissors. We confirmed the location of the tumor using the brain lab neuronavigation. I then used bipolar cautery to create a small corticotomy. I used suction and bipolar cautery discectomy per. We encountered obviously normal tissue. We obtained multiple specimens which we sent off to the pathologist. He came back consistent with necrotic tumor. I used bipolar electrocautery to internally debulk the tumor. We obtained more specimens for permanent section. We then identified the posterior aspect the tumor with the aid of the brain lab neuronavigation. We used the Doro retractor for exposure I performed an anterior temporal lobectomy beginning just posterior to the tumor. We removed all the tumor we could identify. After were satisfied with the tumor resection we obtained hemostasis using bipolar electrocautery. We irrigated the resection cavity out with saline solution. The irrigatant came back for some clear. We then lined the tumor resection cavity with Surgicel. I then reapproximated the patient's craniotomy flap with titanium mini plates and screws. I reapproximated patient's temporalis fascia and muscle with interrupted 2-0 Vicryl suture. I reapproximated the galea with interrupted 2-0 Vicryl suture. I reapproximated the skin with stainless steel staples. The wound was then coated with bacitracin ointment. A sterile dressing was applied. The drapes were removed. I then removed the Mayfield 3 point headrest from his calvarium. This completed the procedure. By report all sponge, instrument, and needle counts were correct at the end this case.

## 2014-08-20 NOTE — Progress Notes (Signed)
eLink Physician-Brief Progress Note Patient Name: [name] DOB: 1935-03-21 MRN: [id number]   Date of Service  08/20/2014  HPI/Events of Note  79 year old white male who presented with mental status changes. He was worked up with a brain MRI which demonstrated a large right temporal tumor now s\p Right temporoparietal craniotomy for tumor resection using brain lab neuronavigation.  Post-op with tachycardia, AIVR, he is s/p CABG 2004, no ischemia on nuclear stress in 2013, with large apical and inferior wall scar,EF 45%  eICU Interventions  1. S\p crainotomy, Right temporal tumor - continue to monitor closely - cont with dexamethasone, monitor CBG, insulin if needed  2. Tachycardia - AIVR - cardiology following - check Mg, K, Phos - ECHO in the AM  3. HLD - resume statin closer to discharge.      Intervention Category Evaluation Type: New Patient Evaluation  Claudell Rhody 08/20/2014, 11:54 PM

## 2014-08-20 NOTE — Anesthesia Postprocedure Evaluation (Signed)
  Anesthesia Post-op Note  Patient: [name]  Procedure(s) Performed: Procedure(s) with comments: CRANIOTOMY TUMOR EXCISION w/ BrainLab (Right) - Right Craniotomy with brain lab for tumor  Patient Location: PACU  Anesthesia Type:General  Level of Consciousness: awake and alert   Airway and Oxygen Therapy: Patient Spontanous Breathing  Post-op Pain: none  Post-op Assessment: Post-op Vital signs reviewed, Patient's Cardiovascular Status Stable and Respiratory Function Stable  Post-op Vital Signs: Reviewed  Filed Vitals:   08/20/14 1945  BP: 104/52  Pulse: 72  Temp:   Resp: 14    Complications: No apparent anesthesia complications

## 2014-08-20 NOTE — Anesthesia Preprocedure Evaluation (Addendum)
Anesthesia Evaluation  Patient identified by MRN, date of birth, ID band Patient awake    Reviewed: Allergy & Precautions, H&P , NPO status , Patient's Chart, lab work & pertinent test results, reviewed documented beta blocker date and time   Airway Mallampati: II TM Distance: >3 FB Neck ROM: Full    Dental  (+) Teeth Intact, Dental Advisory Given   Pulmonary sleep apnea ,  breath sounds clear to auscultation        Cardiovascular hypertension, Pt. on medications + CAD, + Past MI, + Cardiac Stents, + CABG and + Peripheral Vascular Disease Rhythm:Regular Rate:Normal     Neuro/Psych    GI/Hepatic   Endo/Other    Renal/GU      Musculoskeletal   Abdominal   Peds  Hematology   Anesthesia Other Findings   Reproductive/Obstetrics                          Anesthesia Physical Anesthesia Plan  ASA: III  Anesthesia Plan: General   Post-op Pain Management:    Induction: Intravenous  Airway Management Planned: Oral ETT  Additional Equipment: Arterial line and CVP  Intra-op Plan:   Post-operative Plan: Extubation in OR  Informed Consent: I have reviewed the patients History and Physical, chart, labs and discussed the procedure including the risks, benefits and alternatives for the proposed anesthesia with the patient or authorized representative who has indicated his/her understanding and acceptance.   Dental advisory given  Plan Discussed with: CRNA, Anesthesiologist and Surgeon  Anesthesia Plan Comments: (R. Temporal Lobe mass probable glioma Hypertension CAD S/P CABG 2004,mypview March, 2013 showed a large apical scar and large inferior wall scar without ischemia. Ejection fraction was 45%  OSA on CPAP  Plan GA with oral ETT, art line, central line  Roberts Gaudy)      Anesthesia Quick Evaluation

## 2014-08-20 NOTE — Transfer of Care (Signed)
Immediate Anesthesia Transfer of Care Note  Patient: [name]  Procedure(s) Performed: Procedure(s) with comments: CRANIOTOMY TUMOR EXCISION w/ BrainLab (Right) - Right Craniotomy with brain lab for tumor  Patient Location: PACU  Anesthesia Type:General  Level of Consciousness: sedated  Airway & Oxygen Therapy: Patient Spontanous Breathing and Patient connected to face mask oxygen  Post-op Assessment: Report given to PACU RN and Post -op Vital signs reviewed and stable  Post vital signs: Reviewed and stable  Complications: No apparent anesthesia complications

## 2014-08-20 NOTE — H&P (Signed)
Subjective: The patient is a 79 year old white male who has had some mental status changes. He was worked up with a brain MRI which demonstrated a large right temporal tumor. I discussed the various treatment options with the patient including surgery. He has weighed the risks, benefits, and alternatives surgery and decided proceed with a right craniotomy for resection of the tumor.   Past Medical History  Diagnosis Date  . Coronary artery disease   . MI, old     INFERIOR  . Hypertension   . Hyperlipidemia   . Chronic venous insufficiency     and varicose veins  . Anterior myocardial infarction 1990    with subsequent PTCA  . Hypercholesterolemia   . Ulcer of right leg   . Obstructive sleep apnea   . Anemia   . Obesity     Past Surgical History  Procedure Laterality Date  . Cardiac catheterization  06/30/2003    EF 50%  . Coronary artery bypass graft  01/2003    X6. LIMA GRAFT TO THE LDA, FREE RADICAL GRAFT TO THE OM1, SEQUENTIAL SAPHENOUS VEIN GRAFT TO THE FIRST AND SECOND DIAGONAL BRANCES, AND A SEQUENTIAL  VEIN GRAFT TO THE PDA AND POSTERIOR LATERAL BRANCHES OF THE RIGHT CORONARY  . Coronary angioplasty  06/2003    POSTERIOR LATERAL BRANCH  . Tonsillectomy and adenoidectomy    . Cardiovascular stress test  01/23/2009    EF 44%    No Known Allergies  History  Substance Use Topics  . Smoking status: Never Smoker   . Smokeless tobacco: Never Used  . Alcohol Use: No    Family History  Problem Relation Age of Onset  . Cancer Sister   . Heart disease    . Diabetes Brother    Prior to Admission medications   Medication Sig Start Date End Date Taking? Authorizing Provider  aspirin EC 325 MG tablet Take 325 mg by mouth daily.   Yes Historical Provider, MD  Cholecalciferol (VITAMIN D-3 PO) Take 1,000 Units by mouth daily.    Yes Historical Provider, MD  dexamethasone (DECADRON) 2 MG tablet Take 2 mg by mouth 3 (three) times daily with meals.   Yes Historical Provider, MD   KRILL OIL PO Take 1 capsule by mouth every 30 (thirty) days. No specific day.   Yes Historical Provider, MD  metoprolol (LOPRESSOR) 50 MG tablet Take 0.5 tablets (25 mg total) by mouth 2 (two) times daily. 06/10/14  Yes Peter M Martinique, MD  mupirocin ointment (BACTROBAN) 2 % Apply 1 application topically 2 (two) times daily.   Yes Historical Provider, MD  neomycin-polymyxin-pramoxine (NEOSPORIN PLUS) 1 % cream Apply 1 application topically 2 (two) times daily.   Yes Historical Provider, MD  niacin (NIASPAN) 1000 MG CR tablet Take 1,000 mg by mouth at bedtime.     Yes Historical Provider, MD  simvastatin (ZOCOR) 80 MG tablet Take 40 mg by mouth at bedtime.    Yes Historical Provider, MD  Ubiquinol 50 MG CAPS Take 1 capsule by mouth every 30 (thirty) days. No specific day.   Yes Historical Provider, MD  nitroGLYCERIN (NITROSTAT) 0.4 MG SL tablet Place 1 tablet (0.4 mg total) under the tongue every 5 (five) minutes as needed. 12/26/12   Peter M Martinique, MD     Review of Systems  Positive ROS: As above  All other systems have been reviewed and were otherwise negative with the exception of those mentioned in the HPI and as above.  Objective: Vital signs  in last 24 hours: Temp:  [97.8 F (36.6 C)] 97.8 F (36.6 C) (09/16 1144) Pulse Rate:  [49] 49 (09/16 1144) Resp:  [18] 18 (09/16 1144) BP: (133)/(73) 133/73 mmHg (09/16 1144) SpO2:  [98 %] 98 % (09/16 1144) Weight:  [117.935 kg (260 lb)] 117.935 kg (260 lb) (09/16 1144)  General Appearance: Alert, cooperative, no distress, Head: Normocephalic, without obvious abnormality, atraumatic Eyes: PERRL, conjunctiva/corneas clear, EOM's intact,    Ears: Normal  Throat: Normal  Neck: Supple, symmetrical, trachea midline, no adenopathy; thyroid: No enlargement/tenderness/nodules; no carotid bruit or JVD Back: Symmetric, no curvature, ROM normal, no CVA tenderness Lungs: Clear to auscultation bilaterally, respirations unlabored Heart: Regular rate and  rhythm, no murmur, rub or gallop Abdomen: Soft, non-tender,, no masses, no organomegaly Extremities: Extremities normal, atraumatic, no cyanosis or edema Pulses: 2+ and symmetric all extremities Skin: Skin color, texture, turgor normal, no rashes or lesions  NEUROLOGIC:   Mental status: alert and oriented, no aphasia, good attention span, Fund of knowledge/ memory ok Motor Exam - grossly normal he has right drift on Barre Sensory Exam - grossly normal Reflexes:  Coordination - grossly normal Gait - grossly normal Balance - grossly normal Cranial Nerves: I: smell Not tested  II: visual acuity  OS: Normal  OD: Normal   II: visual fields Full to confrontation  II: pupils Equal, round, reactive to light  III,VII: ptosis None  III,IV,VI: extraocular muscles  Full ROM  V: mastication Normal  V: facial light touch sensation  Normal  V,VII: corneal reflex  Present  VII: facial muscle function - upper  Normal  VII: facial muscle function - lower Normal  VIII: hearing Not tested  IX: soft palate elevation  Normal  IX,X: gag reflex Present  XI: trapezius strength  5/5  XI: sternocleidomastoid strength 5/5  XI: neck flexion strength  5/5  XII: tongue strength  Normal    Data Review Lab Results  Component Value Date   WBC 10.4 08/20/2014   HGB 13.9 08/20/2014   HCT 39.8 08/20/2014   MCV 85.0 08/20/2014   PLT 259 08/20/2014   Lab Results  Component Value Date   NA 137 08/20/2014   K 3.8 08/20/2014   CL 102 08/20/2014   CO2 21 08/20/2014   BUN 16 08/20/2014   CREATININE 0.98 08/20/2014   GLUCOSE 114* 08/20/2014   No results found for this basename: INR, PROTIME    Assessment/Plan: Right temporal tumor: I have discussed situation with the patient and his family. I reviewed his imaging studies with them and pointed out the abnormalities. We have discussed the various treatment options including surgery. I described the surgical treatment option of a right craniotomy for resection of the  tumor. I have discussed the risks, benefits, alternatives, and likelihood of achieving our goals with surgery. I've answered all the patient's, and his family's, questions. He has decided proceed with surgery.   Josiane Labine D 08/20/2014 1:37 PM

## 2014-08-20 NOTE — Consult Note (Signed)
CARDIOLOGY CONSULT NOTE  Patient ID: CRIMSON BEER  MRN: [id number]  DOB: 1935-01-26  Admit date: 08/20/2014 Date of Consult: 08/20/2014  Primary Physician: Jerlyn Ly, MD Primary Cardiologist: Peter Martinique, MD  Reason for Consultation: Accelerated idioventricular rhythm  History of Present Illness: [name] is a 79 y.o. male s/p CABG 2004, no ischemia on nuclear stress in 2013, with large apical and inferior wall scar.  EF 45%.  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  He was found to be in AIVR, rate of 80 after the procedure, and cardiology is called to consult.  Patient is still recovering from sedation from procedure and is not able to provide history.   Past Medical History  Diagnosis Date  . Coronary artery disease   . MI, old     INFERIOR  . Hypertension   . Hyperlipidemia   . Chronic venous insufficiency     and varicose veins  . Anterior myocardial infarction 1990    with subsequent PTCA  . Hypercholesterolemia   . Ulcer of right leg   . Obstructive sleep apnea   . Anemia   . Obesity     Past Surgical History  Procedure Laterality Date  . Cardiac catheterization  06/30/2003    EF 50%  . Coronary artery bypass graft  01/2003    X6. LIMA GRAFT TO THE LDA, FREE RADICAL GRAFT TO THE OM1, SEQUENTIAL SAPHENOUS VEIN GRAFT TO THE FIRST AND SECOND DIAGONAL BRANCES, AND A SEQUENTIAL  VEIN GRAFT TO THE PDA AND POSTERIOR LATERAL BRANCHES OF THE RIGHT CORONARY  . Coronary angioplasty  06/2003    POSTERIOR LATERAL BRANCH  . Tonsillectomy and adenoidectomy    . Cardiovascular stress test  01/23/2009    EF 44%     Home Meds: Prior to Admission medications   Medication Sig Start Date End Date Taking? Authorizing Provider  aspirin EC 325 MG tablet Take 325 mg by mouth daily.   Yes Historical Provider, MD  Cholecalciferol (VITAMIN D-3 PO) Take 1,000 Units by mouth  daily.    Yes Historical Provider, MD  dexamethasone (DECADRON) 2 MG tablet Take 2 mg by mouth 3 (three) times daily with meals.   Yes Historical Provider, MD  KRILL OIL PO Take 1 capsule by mouth every 30 (thirty) days. No specific day.   Yes Historical Provider, MD  metoprolol (LOPRESSOR) 50 MG tablet Take 0.5 tablets (25 mg total) by mouth 2 (two) times daily. 06/10/14  Yes Peter M Martinique, MD  mupirocin ointment (BACTROBAN) 2 % Apply 1 application topically 2 (two) times daily.   Yes Historical Provider, MD  neomycin-polymyxin-pramoxine (NEOSPORIN PLUS) 1 % cream Apply 1 application topically 2 (two) times daily.   Yes Historical Provider, MD  niacin (NIASPAN) 1000 MG CR tablet Take 1,000 mg by mouth at bedtime.     Yes Historical Provider, MD  simvastatin (ZOCOR) 80 MG tablet Take 40 mg by mouth at bedtime.    Yes Historical Provider, MD  Ubiquinol 50 MG CAPS Take 1 capsule by mouth every 30 (thirty) days. No specific day.   Yes Historical Provider, MD  nitroGLYCERIN (NITROSTAT) 0.4 MG SL tablet Place 1 tablet (0.4 mg total) under the tongue every 5 (five) minutes as needed. 12/26/12   Peter M Martinique, MD    Current Medications:    Allergies:   No Known Allergies  Social History:   The  patient  reports that he has never smoked. He has never used smokeless tobacco. He reports that he does not drink alcohol or use illicit drugs.    Family History:   The patient's family history includes Cancer in his sister; Diabetes in his brother; Heart disease in an other family member.   ROS:  Unable to obtain  Vital Signs: Blood pressure 109/49, pulse 61, temperature 97.4 F (36.3 C), temperature source Oral, resp. rate 16, height 6\' 2"  (1.88 m), weight 117.935 kg (260 lb), SpO2 98.00%.   PHYSICAL EXAM: General:  S/P craniotomy, resting in bed Neck: no JVD Cardiac:  normal S1, S2; RRR; no murmur Lungs:  clear to auscultation bilaterally, no wheezing, rhonchi or rales Abd: soft, nontender, no  hepatomegaly Ext: no edema   EKG:  AIVR with negative forces in inferior and lateral precordial leads, so ventricular rhythm originating from known inferior/apical scar.  Labs: No results found for this basename: CKTOTAL, CKMB, TROPONINI,  in the last 72 hours Lab Results  Component Value Date   WBC 10.4 08/20/2014   HGB 13.9 08/20/2014   HCT 39.8 08/20/2014   MCV 85.0 08/20/2014   PLT 259 08/20/2014    Recent Labs Lab 08/20/14 1212  NA 137  K 3.8  CL 102  CO2 21  BUN 16  CREATININE 0.98  CALCIUM 8.9  GLUCOSE 114*   No results found for this basename: CHOL, HDL, LDLCALC, TRIG   No results found for this basename: DDIMER    Radiology/Studies:  Dg Chest 2 View  08/20/2014   CLINICAL DATA:  Preoperative evaluation for brain tumor resection.  EXAM: CHEST  2 VIEW  COMPARISON:  None.  FINDINGS: Sternotomy wires are intact. Lungs are adequately inflated with minimal linear density in the left base likely scarring. There is blunting of the left costophrenic angle likely pleural parenchymal scarring. There is subtle focal density along the posterior left pleural surface on the lateral film. Cardiomediastinal silhouette is within normal. There are minimal degenerative changes of the spine.  IMPRESSION: No acute cardiopulmonary disease.  Linear density left base likely atelectasis. Subtle focal density along the pleural surface of the posterior left lung base on the lateral film likely atelectasis or pleural-parenchymal scarring, however cannot exclude a parenchymal or pleural based mass. Consider noncontrast chest CT for further evaluation.   Electronically Signed   By: Marin Olp M.D.   On: 08/20/2014 14:10   Mr Jeri Cos ZP Contrast  08/19/2014   CLINICAL DATA:  Brain tumor.  EXAM: MRI HEAD WITHOUT AND WITH CONTRAST  TECHNIQUE: Multiplanar, multiecho pulse sequences of the brain and surrounding structures were obtained without and with intravenous contrast.  CONTRAST:  20 mL MultiHance   COMPARISON:  08/14/2014 brain MRI from [facility] [place]  FINDINGS: There is no acute infarct. T2 hyperintense mass in the right temporal lobe measures approximately 5.4 x 4.4 x 4.6 cm, not significantly unchanged in size from recent outside MRI. The mass demonstrates irregular, predominantly peripheral enhancement with evidence of central necrosis. A small amount of susceptibility artifact centrally within the lesion may reflect old blood products. The mass partially compresses and medially displaces the temporal horn of the right lateral ventricle. Moderate surrounding edema does not appear significantly changed and partially extends into the right parietal white matter as well as the posterior internal and external capsules. 6 mm of leftward midline shift has not significantly changed. There is mild mass effect on the right midbrain.  No other enhancing lesions  are identified. Scattered, small foci of T2 hyperintensity in the cerebral white matter elsewhere are nonspecific but compatible with mild chronic small vessel ischemic disease. There is mild to moderate cerebral atrophy. There is no extra-axial fluid collection.  Orbits are unremarkable. Tiny left maxillary sinus mucous retention cyst is noted. Mastoid air cells are clear. Major intracranial vascular flow voids are preserved.  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   Electronically Signed   By: Logan Bores   On: 08/19/2014 14:24   Dg Chest Port 1 View  08/20/2014   CLINICAL DATA:  Status post central line placement.  EXAM: PORTABLE CHEST - 1 VIEW  COMPARISON:  08/20/2014  FINDINGS: Patient has had median sternotomy and CABG. A left subclavian central venous line has been placed, tip overlying the level of the left brachycephalic -SVC confluence. No pneumothorax. There is left basilar opacity consistent with atelectasis  or developing infiltrate. There is mild pulmonary vascular congestion. No overt edema.  IMPRESSION: 1. Interval placement of left-sided central line, tip overlying left brachycephalic -SVC confluence. 2. Developing left lower lobe infiltrate/atelectasis. 3. Pulmonary vascular congestion.   Electronically Signed   By: Shon Hale M.D.   On: 08/20/2014 18:42    ASSESSMENT AND PLAN:  1. AIVR He is having sustained runs of ventricular rhythm at rate of 80.  BP is stable during these episodes.  The pattern on EKG is consistent with originating from area of known scar.  Recommendations: - The rhythm is stable.  Can start low dose metoprolol (PO 6.25mg  q6hr) if able to take PO.   - Check K and Mg and replete K to >4.0, Mg > 2.0 - We will follow, echocardiogram ordered for tomorrow.   Cleotis Lema 08/20/2014 8:59 PM

## 2014-08-20 NOTE — Anesthesia Procedure Notes (Signed)
Procedure Name: Intubation Date/Time: 08/20/2014 2:08 PM Performed by: Maude Leriche D Pre-anesthesia Checklist: Patient identified, Emergency Drugs available, Suction available, Patient being monitored and Timeout performed Patient Re-evaluated:Patient Re-evaluated prior to inductionOxygen Delivery Method: Circle system utilized Preoxygenation: Pre-oxygenation with 100% oxygen Intubation Type: IV induction Ventilation: Mask ventilation without difficulty and Oral airway inserted - appropriate to patient size Laryngoscope Size: Miller and 2 Grade View: Grade I Tube type: Oral Tube size: 8.0 mm Number of attempts: 3 (Dl x 2 with miller 2- grade 3/4 view but not comfortable with view to attempt placement of ETT. DL x 2 with Glidescope- grade 1 view and easy placement of ETT.) Airway Equipment and Method: Stylet and Video-laryngoscopy Placement Confirmation: ETT inserted through vocal cords under direct vision,  positive ETCO2 and breath sounds checked- equal and bilateral Secured at: 23 cm Tube secured with: Tape Dental Injury: Teeth and Oropharynx as per pre-operative assessment

## 2014-08-21 ENCOUNTER — Encounter (HOSPITAL_COMMUNITY): Payer: Self-pay | Admitting: Neurosurgery

## 2014-08-21 ENCOUNTER — Inpatient Hospital Stay (HOSPITAL_COMMUNITY): Payer: Medicare Other

## 2014-08-21 DIAGNOSIS — I25810 Atherosclerosis of coronary artery bypass graft(s) without angina pectoris: Secondary | ICD-10-CM

## 2014-08-21 DIAGNOSIS — I059 Rheumatic mitral valve disease, unspecified: Secondary | ICD-10-CM

## 2014-08-21 DIAGNOSIS — I2589 Other forms of chronic ischemic heart disease: Secondary | ICD-10-CM

## 2014-08-21 DIAGNOSIS — I498 Other specified cardiac arrhythmias: Secondary | ICD-10-CM

## 2014-08-21 DIAGNOSIS — R9431 Abnormal electrocardiogram [ECG] [EKG]: Secondary | ICD-10-CM

## 2014-08-21 DIAGNOSIS — I252 Old myocardial infarction: Secondary | ICD-10-CM

## 2014-08-21 MED ORDER — GADOBENATE DIMEGLUMINE 529 MG/ML IV SOLN
20.0000 mL | Freq: Once | INTRAVENOUS | Status: AC | PRN
  Administered 2014-08-21: 20 mL via INTRAVENOUS

## 2014-08-21 MED ORDER — PERFLUTREN LIPID MICROSPHERE
4.0000 mL | Freq: Once | INTRAVENOUS | Status: AC
  Administered 2014-08-21: 4 mL via INTRAVENOUS

## 2014-08-21 NOTE — Progress Notes (Signed)
Patient Name: [name] Date of Encounter: 08/21/2014     Active Problems:   Brain tumor    SUBJECTIVE  Sedated but intermittently answers questions. Still confused.  CURRENT MEDS . atorvastatin  40 mg Oral q1800  . dexamethasone  6 mg Intravenous 4 times per day   Followed by  . [START ON 08/22/2014] dexamethasone  4 mg Intravenous 4 times per day   Followed by  . [START ON 08/23/2014] dexamethasone  4 mg Intravenous 3 times per day  . docusate sodium  100 mg Oral BID  . levETIRAcetam  500 mg Intravenous Q12H  . metoprolol  25 mg Oral BID  . niacin  1,000 mg Oral QHS  . pantoprazole (PROTONIX) IV  40 mg Intravenous QHS    OBJECTIVE  Filed Vitals:   08/21/14 0700 08/21/14 0753 08/21/14 0800 08/21/14 0900  BP: 121/67  123/69 112/64  Pulse: 77  76 77  Temp:  98.4 F (36.9 C)    TempSrc:  Oral    Resp: 16  16 16   Height:      Weight:      SpO2: 97%  98% 97%    Intake/Output Summary (Last 24 hours) at 08/21/14 1047 Last data filed at 08/21/14 0900  Gross per 24 hour  Intake   3060 ml  Output   1527 ml  Net   1533 ml   Filed Weights   08/20/14 1144  Weight: 260 lb (117.935 kg)    PHYSICAL EXAM  General: Pleasant, NAD. sedated Neuro: Alert and oriented X 3. Moves all extremities spontaneously. Psych: Normal affect. HEENT:  Normal  Neck: Supple without bruits or JVD. Lungs:  Resp regular and unlabored, CTA. Heart: RRR no s3, s4, or murmurs. Abdomen: Soft, non-tender, non-distended, BS + x 4.  Extremities: No clubbing, cyanosis or edema. DP/PT/Radials 2+ and equal bilaterally.  Accessory Clinical Findings  CBC  Recent Labs  08/20/14 1212  WBC 10.4  HGB 13.9  HCT 39.8  MCV 85.0  PLT 175   Basic Metabolic Panel  Recent Labs  08/20/14 1212 08/20/14 2300  NA 137 137  K 3.8 3.9  CL 102 100  CO2 21 21  GLUCOSE 114* 202*  BUN 16 16  CREATININE 0.98 0.91  CALCIUM 8.9 8.3*  MG  --  1.8   Liver Function Tests  Recent Labs  08/20/14 2300  AST 15  ALT 14  ALKPHOS 52  BILITOT 0.3  PROT 5.4*  ALBUMIN 2.9*    TELE  NSR w/ sustained runs of ventricular rhythm at rate of 80   Radiology/Studies  Dg Chest 2 View  08/20/2014   CLINICAL DATA:  Preoperative evaluation for brain tumor resection.  EXAM: CHEST  2 VIEW  COMPARISON:  None.  FINDINGS: Sternotomy wires are intact. Lungs are adequately inflated with minimal linear density in the left base likely scarring. There is blunting of the left costophrenic angle likely pleural parenchymal scarring. There is subtle focal density along the posterior left pleural surface on the lateral film. Cardiomediastinal silhouette is within normal. There are minimal degenerative changes of the spine.  IMPRESSION: No acute cardiopulmonary disease.  Linear density left base likely atelectasis. Subtle focal density along the pleural surface of the posterior left lung base on the lateral film likely atelectasis or pleural-parenchymal scarring, however cannot exclude a parenchymal or pleural based mass. Consider noncontrast chest CT for further evaluation.   Electronically Signed   By: Marin Olp M.D.   On:  08/20/2014 14:10   Mr Jeri Cos ZD Contrast  08/19/2014   CLINICAL DATA:  Brain tumor.  EXAM: MRI HEAD WITHOUT AND WITH CONTRAST  TECHNIQUE: Multiplanar, multiecho pulse sequences of the brain and surrounding structures were obtained without and with intravenous contrast.  CONTRAST:  20 mL MultiHance  COMPARISON:  08/14/2014 brain MRI from [facility] [place]  FINDINGS: There is no acute infarct. T2 hyperintense mass in the right temporal lobe measures approximately 5.4 x 4.4 x 4.6 cm, not significantly unchanged in size from recent outside MRI. The mass demonstrates irregular, predominantly peripheral enhancement with evidence of central necrosis. A small amount of susceptibility artifact centrally within the lesion may reflect old blood products. The mass partially compresses and  medially displaces the temporal horn of the right lateral ventricle. Moderate surrounding edema does not appear significantly changed and partially extends into the right parietal white matter as well as the posterior internal and external capsules. 6 mm of leftward midline shift has not significantly changed. There is mild mass effect on the right midbrain.  No other enhancing lesions are identified. Scattered, small foci of T2 hyperintensity in the cerebral white matter elsewhere are nonspecific but compatible with mild chronic small vessel ischemic disease. There is mild to moderate cerebral atrophy. There is no extra-axial fluid collection.  Orbits are unremarkable. Tiny left maxillary sinus mucous retention cyst is noted. Mastoid air cells are clear. Major intracranial vascular flow voids are preserved.  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   Electronically Signed   By: Logan Bores   On: 08/19/2014 14:24   Dg Chest Port 1 View  08/20/2014   CLINICAL DATA:  Status post central line placement.  EXAM: PORTABLE CHEST - 1 VIEW  COMPARISON:  08/20/2014  FINDINGS: Patient has had median sternotomy and CABG. A left subclavian central venous line has been placed, tip overlying the level of the left brachycephalic -SVC confluence. No pneumothorax. There is left basilar opacity consistent with atelectasis or developing infiltrate. There is mild pulmonary vascular congestion. No overt edema.  IMPRESSION: 1. Interval placement of left-sided central line, tip overlying left brachycephalic -SVC confluence. 2. Developing left lower lobe infiltrate/atelectasis. 3. Pulmonary vascular congestion.   Electronically Signed   By: Shon Hale M.D.   On: 08/20/2014 18:42    ASSESSMENT AND PLAN  [name] is a 79 y.o. male with a hx of HTN, GERD and CAD s/p multiple stents and CABG 2004, no  ischemia on nuclear stress in 2013, with large apical and inferior wall scar. EF 45%. Unfortunately he was found to have a large right temporal tumor after presenting with mental status changes, and underwent cranitomy yesteday. He was noted to be in AIVR, rate of 80 after the procedure, and cardiology was consulted.   AIVR -- He was noted to have  sustained runs of ventricular rhythm at rate of 80. BP is stable during these episodes. The pattern on EKG is consistent with originating from area of known scar per Dr. Kennith Center.  - The rhythm is stable. On metoprolol 25mg  BID - Check K and Mg and replete K to >4.0, Mg > 2.0. Currently K 3.9 and 1.8 - 2D ECHO done today. Pending images.   CAD- s/p CABG 2004 and multiple stents. Followed by Dr. Martinique -- Continue statin and BB  GERD- cont protonix  HTN- running high this AM. Given 20mg  IV  Labatelol -- Continue metoprolol 25mg  BID  Right temporal tumor- S\p crainotomy on 08/20/14  HLD- cont statin and niacin  Signed, Eileen Stanford PA-C  Pager [phone]  I have seen and examined the patient along with Angelena Form R PA-C.  I have reviewed the chart, notes and new data.  I agree with PA's note.   Hemodynamically compensated brief runs of AIVR do not require specific Rx, but need to keep on his chronic beta blocker without interruption. AIVR could be slow "scar" reentry arrhythmia related to old inferior MI or related to hyperadrenergic state post craniotomy. Note prolonged QT interval, even when he has narrow complex QRS (QTc approx 500 ms). This is probably related to intracranial process.Avoid QT prolonging agents (macrolides, quinolones, neuroleptics, etc). Keep K and Mg in normal range. Will review echo, but do not expect this will change recommendations.  Sanda Klein, MD, [place] [phone] 08/21/2014, 12:33 PM

## 2014-08-21 NOTE — Progress Notes (Signed)
UR completed.  Joshuwa Vecchio, RN BSN [facility] CCM Trauma/Neuro ICU Case Manager [phone]  

## 2014-08-21 NOTE — Progress Notes (Signed)
  Echocardiogram 2D Echocardiogram has been performed.  [name] M 08/21/2014, 11:29 AM

## 2014-08-21 NOTE — Progress Notes (Signed)
Patient ID: [name], male   DOB: 17-Aug-1935, 79 y.o.   MRN: [id number] Subjective:  The patient is alert and pleasant. He is in no apparent distress. He looks well. I spoke with his family.  Objective: Vital signs in last 24 hours: Temp:  [97.4 F (36.3 C)-98.4 F (36.9 C)] 98.4 F (36.9 C) (09/17 0753) Pulse Rate:  [58-86] 84 (09/17 1200) Resp:  [14-21] 19 (09/17 1200) BP: (98-126)/(49-75) 125/65 mmHg (09/17 1200) SpO2:  [96 %-100 %] 97 % (09/17 1200) Arterial Line BP: (106-181)/(39-66) 170/63 mmHg (09/17 1100)  Intake/Output from previous day: 09/16 0701 - 09/17 0700 In: 2910 [I.V.:2710; IV Piggyback:200] Out: 1452 [Urine:1252; Blood:200] Intake/Output this shift: Total I/O In: 475 [I.V.:375; IV Piggyback:100] Out: 345 [Urine:345]  Physical exam the patient is alert and oriented x3. His pupils are equal. His strength is normal. His speech is normal.  Lab Results:  Recent Labs  08/20/14 1212  WBC 10.4  HGB 13.9  HCT 39.8  PLT 259   BMET  Recent Labs  08/20/14 1212 08/20/14 2300  NA 137 137  K 3.8 3.9  CL 102 100  CO2 21 21  GLUCOSE 114* 202*  BUN 16 16  CREATININE 0.98 0.91  CALCIUM 8.9 8.3*    Studies/Results: Dg Chest 2 View  08/20/2014   CLINICAL DATA:  Preoperative evaluation for brain tumor resection.  EXAM: CHEST  2 VIEW  COMPARISON:  None.  FINDINGS: Sternotomy wires are intact. Lungs are adequately inflated with minimal linear density in the left base likely scarring. There is blunting of the left costophrenic angle likely pleural parenchymal scarring. There is subtle focal density along the posterior left pleural surface on the lateral film. Cardiomediastinal silhouette is within normal. There are minimal degenerative changes of the spine.  IMPRESSION: No acute cardiopulmonary disease.  Linear density left base likely atelectasis. Subtle focal density along the pleural surface of the posterior left lung base on the lateral film likely atelectasis  or pleural-parenchymal scarring, however cannot exclude a parenchymal or pleural based mass. Consider noncontrast chest CT for further evaluation.   Electronically Signed   By: Marin Olp M.D.   On: 08/20/2014 14:10   Mr Jeri Cos QP Contrast  08/19/2014   CLINICAL DATA:  Brain tumor.  EXAM: MRI HEAD WITHOUT AND WITH CONTRAST  TECHNIQUE: Multiplanar, multiecho pulse sequences of the brain and surrounding structures were obtained without and with intravenous contrast.  CONTRAST:  20 mL MultiHance  COMPARISON:  08/14/2014 brain MRI from [facility] [place]  FINDINGS: There is no acute infarct. T2 hyperintense mass in the right temporal lobe measures approximately 5.4 x 4.4 x 4.6 cm, not significantly unchanged in size from recent outside MRI. The mass demonstrates irregular, predominantly peripheral enhancement with evidence of central necrosis. A small amount of susceptibility artifact centrally within the lesion may reflect old blood products. The mass partially compresses and medially displaces the temporal horn of the right lateral ventricle. Moderate surrounding edema does not appear significantly changed and partially extends into the right parietal white matter as well as the posterior internal and external capsules. 6 mm of leftward midline shift has not significantly changed. There is mild mass effect on the right midbrain.  No other enhancing lesions are identified. Scattered, small foci of T2 hyperintensity in the cerebral white matter elsewhere are nonspecific but compatible with mild chronic small vessel ischemic disease. There is mild to moderate cerebral atrophy. There is no extra-axial fluid collection.  Orbits are unremarkable.  Tiny left maxillary sinus mucous retention cyst is noted. Mastoid air cells are clear. Major intracranial vascular flow voids are preserved.  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   Electronically Signed   By: Logan Bores   On: 08/19/2014 14:24   Dg Chest Port 1 View  08/20/2014   CLINICAL DATA:  Status post central line placement.  EXAM: PORTABLE CHEST - 1 VIEW  COMPARISON:  08/20/2014  FINDINGS: Patient has had median sternotomy and CABG. A left subclavian central venous line has been placed, tip overlying the level of the left brachycephalic -SVC confluence. No pneumothorax. There is left basilar opacity consistent with atelectasis or developing infiltrate. There is mild pulmonary vascular congestion. No overt edema.  IMPRESSION: 1. Interval placement of left-sided central line, tip overlying left brachycephalic -SVC confluence. 2. Developing left lower lobe infiltrate/atelectasis. 3. Pulmonary vascular congestion.   Electronically Signed   By: Shon Hale M.D.   On: 08/20/2014 18:42    Assessment/Plan: Postop day 1: The patient is doing well.  LOS: 1 day     Oksana Deberry D 08/21/2014, 12:34 PM

## 2014-08-22 MED ORDER — CHLORHEXIDINE GLUCONATE 0.12 % MT SOLN
15.0000 mL | Freq: Two times a day (BID) | OROMUCOSAL | Status: DC
  Administered 2014-08-22: 15 mL via OROMUCOSAL
  Filled 2014-08-22: qty 15

## 2014-08-22 MED ORDER — LEVETIRACETAM 500 MG PO TABS
500.0000 mg | ORAL_TABLET | Freq: Two times a day (BID) | ORAL | Status: DC
  Administered 2014-08-22 – 2014-08-27 (×9): 500 mg via ORAL
  Filled 2014-08-22 (×11): qty 1

## 2014-08-22 MED ORDER — INFLUENZA VAC SPLIT QUAD 0.5 ML IM SUSY
0.5000 mL | PREFILLED_SYRINGE | INTRAMUSCULAR | Status: AC
  Administered 2014-08-23: 0.5 mL via INTRAMUSCULAR
  Filled 2014-08-22: qty 0.5

## 2014-08-22 MED ORDER — PANTOPRAZOLE SODIUM 40 MG PO TBEC
40.0000 mg | DELAYED_RELEASE_TABLET | Freq: Every day | ORAL | Status: DC
  Administered 2014-08-22 – 2014-08-27 (×6): 40 mg via ORAL
  Filled 2014-08-22 (×6): qty 1

## 2014-08-22 MED ORDER — CETYLPYRIDINIUM CHLORIDE 0.05 % MT LIQD
7.0000 mL | Freq: Two times a day (BID) | OROMUCOSAL | Status: DC
  Administered 2014-08-22: 7 mL via OROMUCOSAL

## 2014-08-22 NOTE — Progress Notes (Signed)
Patient ID: [name], male   DOB: 1934/12/17, 79 y.o.   MRN: [id number] Subjective:  The patient is somnolent but arousable. He is in no apparent distress.  Objective: Vital signs in last 24 hours: Temp:  [97.9 F (36.6 C)-98.6 F (37 C)] 98.2 F (36.8 C) (09/18 0000) Pulse Rate:  [54-84] 59 (09/18 0700) Resp:  [11-21] 16 (09/18 0700) BP: (104-150)/(59-91) 109/65 mmHg (09/18 0700) SpO2:  [93 %-99 %] 96 % (09/18 0700) Arterial Line BP: (167-181)/(57-63) 170/63 mmHg (09/17 1100)  Intake/Output from previous day: 09/17 0701 - 09/18 0700 In: 1490 [I.V.:1290; IV Piggyback:200] Out: 1495 [Urine:1495] Intake/Output this shift:    Physical exam the patient is somnolent but easily arousable. He answer simple questions. He is moving all 4 extremities well. His pupils are equal. His dressing is clean and dry.   I reviewed the patient's postoperative brain MRI performed yesterday. It demonstrates most of the tumor is gone. He has some pneumocephaly.  Lab Results:  Recent Labs  08/20/14 1212  WBC 10.4  HGB 13.9  HCT 39.8  PLT 259   BMET  Recent Labs  08/20/14 1212 08/20/14 2300  NA 137 137  K 3.8 3.9  CL 102 100  CO2 21 21  GLUCOSE 114* 202*  BUN 16 16  CREATININE 0.98 0.91  CALCIUM 8.9 8.3*    Studies/Results: Dg Chest 2 View  08/20/2014   CLINICAL DATA:  Preoperative evaluation for brain tumor resection.  EXAM: CHEST  2 VIEW  COMPARISON:  None.  FINDINGS: Sternotomy wires are intact. Lungs are adequately inflated with minimal linear density in the left base likely scarring. There is blunting of the left costophrenic angle likely pleural parenchymal scarring. There is subtle focal density along the posterior left pleural surface on the lateral film. Cardiomediastinal silhouette is within normal. There are minimal degenerative changes of the spine.  IMPRESSION: No acute cardiopulmonary disease.  Linear density left base likely atelectasis. Subtle focal density along the  pleural surface of the posterior left lung base on the lateral film likely atelectasis or pleural-parenchymal scarring, however cannot exclude a parenchymal or pleural based mass. Consider noncontrast chest CT for further evaluation.   Electronically Signed   By: Marin Olp M.D.   On: 08/20/2014 14:10   Mr Jeri Cos FT Contrast  08/21/2014   CLINICAL DATA:  79 year old male post intracranial tumor resection. Subsequent encounter.  EXAM: MRI HEAD WITHOUT AND WITH CONTRAST  TECHNIQUE: Multiplanar, multiecho pulse sequences of the brain and surrounding structures were obtained without and with intravenous contrast.  CONTRAST:  54mL MULTIHANCE GADOBENATE DIMEGLUMINE 529 MG/ML IV SOLN  COMPARISON:  08/19/2014.  FINDINGS: Post right frontal craniotomy for resection of large right temporal lobe mass. Moderate pneumocephalus greater on the right with displacement of the frontal lobes posteriorly and slightly to the left.  In the operative cavity, fluid and small amount hemorrhage is noted.  Enhancement along the periphery of the operative site most notable superior margin may be related to residual tumor.  Prominent surrounding T2/FLAIR altered signal intensity extends into remainder of the temporal lobe, basal ganglia and right periatrial region. This may represent vasogenic edema which will resolve however, depending on the type tumor, this appearance may also represent combination of tumor cells and edema. The present examination will represent the patient's first postoperative baseline exam to which follow-up exam can be compared to determine if this represents residual tumor.  Mass effect upon the left lateral ventricle. 1 cm midline shift to the  left. Mild prominence left lateral ventricle unchanged.  Small infarct along the superior posterior margin of the resection site.  IMPRESSION: Post right frontal craniotomy for resection of large right temporal lobe mass. Moderate pneumocephalus greater on the right with  displacement of the frontal lobes posteriorly and slightly to the left.  Enhancement along the periphery of the operative site most notable superior margin may be related to residual tumor.  Prominent surrounding T2/FLAIR altered signal intensity extends into remainder of the temporal lobe, basal ganglia and right periatrial region. This may represent vasogenic edema which will resolve however, depending on the type tumor, this appearance may also represent combination of tumor cells and edema. The present examination will represent the patient's first postoperative baseline exam to which follow-up exam can be compared to determine if this represents residual tumor.  Mass effect upon the left lateral ventricle. 1 cm midline shift to the left. Mild prominence left lateral ventricle unchanged.  Small infarct along the superior posterior margin of the resection site.  These results will be called to the ordering clinician or representative by the Radiologist Assistant, and communication documented in the PACS or zVision Dashboard.   Electronically Signed   By: Chauncey Cruel M.D.   On: 08/21/2014 15:23   Dg Chest Port 1 View  08/20/2014   CLINICAL DATA:  Status post central line placement.  EXAM: PORTABLE CHEST - 1 VIEW  COMPARISON:  08/20/2014  FINDINGS: Patient has had median sternotomy and CABG. A left subclavian central venous line has been placed, tip overlying the level of the left brachycephalic -SVC confluence. No pneumothorax. There is left basilar opacity consistent with atelectasis or developing infiltrate. There is mild pulmonary vascular congestion. No overt edema.  IMPRESSION: 1. Interval placement of left-sided central line, tip overlying left brachycephalic -SVC confluence. 2. Developing left lower lobe infiltrate/atelectasis. 3. Pulmonary vascular congestion.   Electronically Signed   By: Shon Hale M.D.   On: 08/20/2014 18:42    Assessment/Plan: Postop day #2: The patient is neurologically  stable. We will mobilize him with PT. We will await pathology but I suspect this is a glioblastoma. I have discussed situation with the patient's family. He may be able to be discharged over the weekend.  Atelectasis: The patient is afebrile  LOS: 2 days     Helmi Hechavarria D 08/22/2014, 7:50 AM

## 2014-08-22 NOTE — Progress Notes (Signed)
Rehab Admissions Coordinator Note:  Patient was screened by Retta Diones for appropriateness for an Inpatient Acute Rehab Consult.  At this time, we are recommending Inpatient Rehab consult.  Retta Diones 08/22/2014, 3:57 PM  I can be reached at [phone].

## 2014-08-22 NOTE — Progress Notes (Addendum)
Patient Name: [name] Date of Encounter: 08/22/2014     Active Problems:   Brain tumor    SUBJECTIVE  Awake and less sedated today. Able to respond appropriately. Some mild confusion. No CP, SOB or palpitations.  CURRENT MEDS . atorvastatin  40 mg Oral q1800  . dexamethasone  4 mg Intravenous 4 times per day   Followed by  . [START ON 08/23/2014] dexamethasone  4 mg Intravenous 3 times per day  . docusate sodium  100 mg Oral BID  . levETIRAcetam  500 mg Intravenous Q12H  . metoprolol  25 mg Oral BID  . niacin  1,000 mg Oral QHS  . pantoprazole (PROTONIX) IV  40 mg Intravenous QHS    OBJECTIVE  Filed Vitals:   08/22/14 0400 08/22/14 0500 08/22/14 0600 08/22/14 0700  BP: 150/78 110/63 122/60 109/65  Pulse: 83 58 60 59  Temp:      TempSrc:      Resp: 18 15 17 16   Height:      Weight:      SpO2: 98% 97% 96% 96%    Intake/Output Summary (Last 24 hours) at 08/22/14 0835 Last data filed at 08/22/14 0700  Gross per 24 hour  Intake   1415 ml  Output   1420 ml  Net     -5 ml   Filed Weights   08/20/14 1144  Weight: 260 lb (117.935 kg)    PHYSICAL EXAM  General: Pleasant, NAD. Sleepy but more awake today Neuro: Alert and oriented X 3. Moves all extremities spontaneously. Psych: Normal affect. HEENT:  Normal  Neck: Supple without bruits or JVD. Lungs:  Resp regular and unlabored, expiratory rhonchi Heart: RRR no s3, s4, + murmurs. Abdomen: Soft, non-tender, non-distended, BS + x 4.  Extremities: No clubbing, cyanosis or edema. DP/PT/Radials 2+ and equal bilaterally.  Accessory Clinical Findings  CBC  Recent Labs  08/20/14 1212  WBC 10.4  HGB 13.9  HCT 39.8  MCV 85.0  PLT 597   Basic Metabolic Panel  Recent Labs  08/20/14 1212 08/20/14 2300  NA 137 137  K 3.8 3.9  CL 102 100  CO2 21 21  GLUCOSE 114* 202*  BUN 16 16  CREATININE 0.98 0.91  CALCIUM 8.9 8.3*  MG  --  1.8   Liver Function Tests  Recent Labs  08/20/14 2300  AST  15  ALT 14  ALKPHOS 52  BILITOT 0.3  PROT 5.4*  ALBUMIN 2.9*    TELE  NSR w/ some PVCs   Radiology/Studies  Dg Chest 2 View  08/20/2014   CLINICAL DATA:  Preoperative evaluation for brain tumor resection.  EXAM: CHEST  2 VIEW  COMPARISON:  None.  FINDINGS: Sternotomy wires are intact. Lungs are adequately inflated with minimal linear density in the left base likely scarring. There is blunting of the left costophrenic angle likely pleural parenchymal scarring. There is subtle focal density along the posterior left pleural surface on the lateral film. Cardiomediastinal silhouette is within normal. There are minimal degenerative changes of the spine.  IMPRESSION: No acute cardiopulmonary disease.  Linear density left base likely atelectasis. Subtle focal density along the pleural surface of the posterior left lung base on the lateral film likely atelectasis or pleural-parenchymal scarring, however cannot exclude a parenchymal or pleural based mass. Consider noncontrast chest CT for further evaluation.   Electronically Signed   By: Marin Olp M.D.   On: 08/20/2014 14:10   Mr Jeri Cos CB Contrast  08/19/2014   CLINICAL DATA:  Brain tumor.  EXAM: MRI HEAD WITHOUT AND WITH CONTRAST  TECHNIQUE: Multiplanar, multiecho pulse sequences of the brain and surrounding structures were obtained without and with intravenous contrast.  CONTRAST:  20 mL MultiHance  COMPARISON:  08/14/2014 brain MRI from [facility] [place]  FINDINGS: There is no acute infarct. T2 hyperintense mass in the right temporal lobe measures approximately 5.4 x 4.4 x 4.6 cm, not significantly unchanged in size from recent outside MRI. The mass demonstrates irregular, predominantly peripheral enhancement with evidence of central necrosis. A small amount of susceptibility artifact centrally within the lesion may reflect old blood products. The mass partially compresses and medially displaces the temporal horn of the right lateral  ventricle. Moderate surrounding edema does not appear significantly changed and partially extends into the right parietal white matter as well as the posterior internal and external capsules. 6 mm of leftward midline shift has not significantly changed. There is mild mass effect on the right midbrain.  No other enhancing lesions are identified. Scattered, small foci of T2 hyperintensity in the cerebral white matter elsewhere are nonspecific but compatible with mild chronic small vessel ischemic disease. There is mild to moderate cerebral atrophy. There is no extra-axial fluid collection.  Orbits are unremarkable. Tiny left maxillary sinus mucous retention cyst is noted. Mastoid air cells are clear. Major intracranial vascular flow voids are preserved.  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   Electronically Signed   By: Logan Bores   On: 08/19/2014 14:24   Dg Chest Port 1 View  08/20/2014   CLINICAL DATA:  Status post central line placement.  EXAM: PORTABLE CHEST - 1 VIEW  COMPARISON:  08/20/2014  FINDINGS: Patient has had median sternotomy and CABG. A left subclavian central venous line has been placed, tip overlying the level of the left brachycephalic -SVC confluence. No pneumothorax. There is left basilar opacity consistent with atelectasis or developing infiltrate. There is mild pulmonary vascular congestion. No overt edema.  IMPRESSION: 1. Interval placement of left-sided central line, tip overlying left brachycephalic -SVC confluence. 2. Developing left lower lobe infiltrate/atelectasis. 3. Pulmonary vascular congestion.    2D ECHO: 08/21/2014 LV EF: 35% - 40% Study Conclusions - Left ventricle: The cavity size was mildly dilated. Systolic function was moderately reduced. The estimated ejection fraction was in the range of 35% to 40%. Probable peri-apical severe hypokinesis.  Doppler parameters are consistent with abnormal left ventricular relaxation (grade 1 diastolic dysfunction). - Aortic valve: There was no stenosis. There was trivial regurgitation. - Aorta: Mildly dilated aortic root. Aortic root dimension: 39 mm (ED). - Mitral valve: Mildly calcified annulus. Mildly calcified leaflets . There was mild regurgitation. - Left atrium: The atrium was mildly dilated. - Right ventricle: Poorly visualized. - Right atrium: Poorly visualized. - Tricuspid valve: Poorly visualized. - Pulmonary arteries: No complete TR doppler jet so unable to estimate PA systolic pressure. - Systemic veins: IVC not visualized. - Pericardium, extracardiac: A trivial pericardial effusion was identified posterior to the heart. Impressions: - Technically difficult study. The apical images were basically uninterpretable despite use of Definity. Mildly dilated LV, EF probably around 35-40% with peri-apical severe hypokinesis. RV was not visualized. Mild MR. Could consider cardiac MRI to obtain more accurate determination of LV and RV function.   ASSESSMENT AND PLAN  [name] is a 79 y.o. male with a hx of HTN, GERD  and CAD s/p multiple stents and CABG 2004, no ischemia on nuclear stress in 2013, with large apical and inferior wall scar. EF 45%. Unfortunately he was found to have a large right temporal tumor after presenting with mental status changes, and underwent cranitomy yesteday. He was noted to be in AIVR, rate of 80 after the procedure, and cardiology was consulted.   AIVR - Hemodynamically compensated brief runs of AIVR do not require specific Rx, but need to keep on his chronic beta blocker without interruption. AIVR could be slow "scar" reentry arrhythmia related to old inferior MI or related to hyperadrenergic state post craniotomy. -- Continue metoprolol 25mg  BID -- K 3.9 and Mag 1.8 -- 2D ECHO done yesterday. "Impressions:Technically difficult study. The apical  images were basically uninterpretable despite use of Definity. Mildly dilated LV, EF probably around 35-40% with peri-apical severe hypokinesis. RV was not visualized. Mild MR. Could consider cardiac MRI to obtain more accurate determination of LV and RV function." -- Tele with no runs of AIVR today  Prolonged QT interval- even when he has narrow complex QRS (QTc approx 500 ms). This is probably related to intracranial process.Avoid QT prolonging agents (macrolides, quinolones, neuroleptics, etc). Keep K and Mg in normal range.  CAD- s/p CABG 2004 and multiple stents. Followed by Dr. Martinique -- Continue statin and BB  GERD- cont protonix  HTN- well controlled.  -- Continue metoprolol 25mg  BID  Right temporal tumor- S\p crainotomy on 08/20/14 --Postop day #2: The patient is neurologically stable. Await pathology but suspected to be a glioblastoma. He may be able to be discharged over the weekend  HLD- cont statin and niacin  Signed, Eileen Stanford PA-C  Pager [phone]    I have seen and examined the patient along with Angelena Form R PA-C.  I have reviewed the chart, notes and new data.  I agree with PA's note.  Key new complaints: sleepy, but readily arousable, coherent and oriented Key examination changes: no clinical HF Key new findings / data: monitor shows isolated PVCs, no further AIVR, QTc has shortened to about 450 ms Echo reports a decrement in LVEF but no change in the wall motion pattern, poor quality study unfortunately. Some degree of stress cardiomyopathy? Suspicion for new coronary event is very low. No plans for additional evaluation at this time  PLAN: No new recommendations. Continue beta blocker and f/u with Dr. Martinique.  Sanda Klein, MD, [place] [phone] 08/22/2014, 10:27 AM

## 2014-08-22 NOTE — Evaluation (Signed)
Physical Therapy Evaluation Patient Details Name: [name] MRN: [id number] DOB: 1935/11/08 Today's Date: 08/22/2014   History of Present Illness  patient presented with AMS, now s/p Right temporoparietal craniotomy for tumor resection using brain lab neuronavigation  Clinical Impression  Patient demonstrates deficits in functional mobility as indicated below. Will need continued skilled PT to address deficits and maximize function. Will see as indicated and progress as tolerated.     Follow Up Recommendations CIR;Supervision/Assistance - 24 hour    Equipment Recommendations  Other (comment) (tbd)    Recommendations for Other Services Rehab consult     Precautions / Restrictions Precautions Precautions: Fall Restrictions Weight Bearing Restrictions: No      Mobility  Bed Mobility Overal bed mobility: Needs Assistance Bed Mobility: Supine to Sit     Supine to sit: Min guard     General bed mobility comments: increased time and Max VCs to perform  Transfers Overall transfer level: Needs assistance Equipment used: 2 person hand held assist Transfers: Sit to/from [facility] Sit to Stand: +2 physical assistance;Mod assist Stand pivot transfers: +2 physical assistance;Mod assist       General transfer comment: Max cues for safety and mobility. patient with significant short shuffling steps, at times appears to have lost attnetion to task and shuffled in place without acutaly moving. Manual cues to direct to task and continue movement  Ambulation/Gait                Stairs            Wheelchair Mobility    Modified Rankin (Stroke Patients Only)       Balance Overall balance assessment: Needs assistance Sitting-balance support: Feet supported Sitting balance-Leahy Scale: Fair     Standing balance support: Bilateral upper extremity supported;During functional activity Standing balance-Leahy Scale: Poor Standing balance  comment: assist for stability                             Pertinent Vitals/Pain Pain Assessment: 0-10 Pain Score: 3  Pain Location: head Pain Descriptors / Indicators: Sore Pain Intervention(s): Monitored during session;Repositioned    Home Living Family/patient expects to be discharged to:: Private residence Living Arrangements: Spouse/significant other Available Help at Discharge: Family Type of Home: House Home Access: Level entry     Home Layout: One level Home Equipment: None      Prior Function Level of Independence: Independent               Hand Dominance   Dominant Hand: Right    Extremity/Trunk Assessment               Lower Extremity Assessment: Generalized weakness;Difficult to assess due to impaired cognition         Communication      Cognition Arousal/Alertness: Lethargic Behavior During Therapy: Flat affect Overall Cognitive Status: Impaired/Different from baseline Area of Impairment: Attention;Memory;Following commands;Safety/judgement;Awareness;Problem solving   Current Attention Level: Focused Memory: Decreased short-term memory Following Commands: Follows one step commands inconsistently;Follows one step commands with increased time Safety/Judgement: Decreased awareness of safety;Decreased awareness of deficits Awareness: Intellectual Problem Solving: Slow processing;Decreased initiation;Difficulty sequencing;Requires verbal cues;Requires tactile cues General Comments: Patient extremely limited attention unsure if limited by attention,     General Comments      Exercises        Assessment/Plan    PT Assessment Patient needs continued PT services  PT Diagnosis Difficulty walking;Abnormality of gait;Generalized weakness;Altered  mental status   PT Problem List Decreased strength;Decreased range of motion;Decreased activity tolerance;Decreased balance;Decreased mobility;Decreased coordination;Decreased cognition   PT Treatment Interventions DME instruction;Gait training;Stair training;Functional mobility training;Therapeutic activities;Therapeutic exercise;Balance training;Patient/family education   PT Goals (Current goals can be found in the Care Plan section) Acute Rehab PT Goals Patient Stated Goal: none stated Time For Goal Achievement: 09/05/14 Potential to Achieve Goals: Good    Frequency Min 4X/week   Barriers to discharge        Co-evaluation               End of Session Equipment Utilized During Treatment: Gait belt Activity Tolerance: Patient limited by fatigue;Patient limited by lethargy Patient left: in chair;with call bell/phone within reach;with chair alarm set Nurse Communication: Mobility status         Time: 7867-5449 PT Time Calculation (min): 21 min   Charges:   PT Evaluation $Initial PT Evaluation Tier I: 1 Procedure PT Treatments $Therapeutic Activity: 8-22 mins   PT G CodesDuncan Dull 08/22/2014, 3:50 PM  Alben Deeds, [place] DPT  [phone]

## 2014-08-23 DIAGNOSIS — C712 Malignant neoplasm of temporal lobe: Secondary | ICD-10-CM | POA: Diagnosis not present

## 2014-08-23 NOTE — Progress Notes (Signed)
Patient ID: [name], male   DOB: Sep 28, 1935, 79 y.o.   MRN: [id number] Awake, no weakness. Neuro stable. No headache

## 2014-08-24 LAB — GLUCOSE, CAPILLARY
GLUCOSE-CAPILLARY: 160 mg/dL — AB (ref 70–99)
GLUCOSE-CAPILLARY: 138 mg/dL — AB (ref 70–99)
GLUCOSE-CAPILLARY: 181 mg/dL — AB (ref 70–99)

## 2014-08-24 NOTE — Progress Notes (Signed)
Patient ID: [name], male   DOB: 20-Apr-1935, 79 y.o.   MRN: [id number] Doing well. Wound dry.spoke with wife

## 2014-08-25 ENCOUNTER — Ambulatory Visit (HOSPITAL_COMMUNITY): Payer: Medicare Other

## 2014-08-25 DIAGNOSIS — D496 Neoplasm of unspecified behavior of brain: Secondary | ICD-10-CM

## 2014-08-25 DIAGNOSIS — C719 Malignant neoplasm of brain, unspecified: Secondary | ICD-10-CM

## 2014-08-25 LAB — GLUCOSE, CAPILLARY
GLUCOSE-CAPILLARY: 140 mg/dL — AB (ref 70–99)
Glucose-Capillary: 185 mg/dL — ABNORMAL HIGH (ref 70–99)
Glucose-Capillary: 201 mg/dL — ABNORMAL HIGH (ref 70–99)

## 2014-08-25 MED ORDER — DEXAMETHASONE SODIUM PHOSPHATE 4 MG/ML IJ SOLN
2.0000 mg | Freq: Three times a day (TID) | INTRAMUSCULAR | Status: DC
  Administered 2014-08-25 – 2014-08-26 (×3): 2 mg via INTRAVENOUS
  Filled 2014-08-25: qty 0.5
  Filled 2014-08-25: qty 1
  Filled 2014-08-25 (×2): qty 0.5
  Filled 2014-08-25: qty 1

## 2014-08-25 NOTE — Progress Notes (Signed)
Physical Therapy Treatment Patient Details Name: [name] MRN: [id number] DOB: 1935/03/27 Today's Date: 08/25/2014    History of Present Illness patient presented with AMS, now s/p Right temporoparietal craniotomy for tumor resection using brain lab neuronavigation    PT Comments    Pt making progress towards goals. Pt able to amb 40'x2 with min A using RW. Frequent VC provided throughout session for technique with functional mobility.   Follow Up Recommendations  CIR;Supervision/Assistance - 24 hour     Equipment Recommendations  Other (comment) (tbd)    Recommendations for Other Services Rehab consult     Precautions / Restrictions Precautions Precautions: Fall    Mobility  Bed Mobility Overal bed mobility: Needs Assistance Bed Mobility: Supine to Sit     Supine to sit: Min guard     General bed mobility comments: Increased time with use of rails  Transfers Overall transfer level: Needs assistance Equipment used: Rolling walker (2 wheeled) Transfers: Sit to/from Stand Sit to Stand: Min assist         General transfer comment: Pt requires cues for hand placement,  Ambulation/Gait Ambulation/Gait assistance: Min assist Ambulation Distance (Feet): 40 Feet (x2) Assistive device: Rolling walker (2 wheeled) Gait Pattern/deviations: Shuffle;Step-to pattern;Decreased stride length Gait velocity: slow Gait velocity interpretation: Below normal speed for age/gender General Gait Details: Able to increase stride length with cueing, but increase in stride length not maintained. Pt requires VC to look up.  Short, shuffling gait pattern with limited weight shift B   Stairs            Wheelchair Mobility    Modified Rankin (Stroke Patients Only)       Balance Overall balance assessment: Needs assistance Sitting-balance support: Feet supported Sitting balance-Leahy Scale: Fair     Standing balance support: Bilateral upper extremity  supported Standing balance-Leahy Scale: Poor                      Cognition Arousal/Alertness: Awake/alert Behavior During Therapy: Flat affect Overall Cognitive Status: Impaired/Different from baseline Area of Impairment: Attention;Memory;Following commands;Safety/judgement;Awareness                    Exercises      General Comments General comments (skin integrity, edema, etc.): Pt requires frequent cueing for functional mobility throughout session.       Pertinent Vitals/Pain Pain Assessment: No/denies pain    Home Living                      Prior Function            PT Goals (current goals can now be found in the care plan section) Acute Rehab PT Goals Patient Stated Goal: none stated Time For Goal Achievement: 09/05/14 Potential to Achieve Goals: Good    Frequency  Min 4X/week    PT Plan      Co-evaluation             End of Session Equipment Utilized During Treatment: Gait belt Activity Tolerance: Patient tolerated treatment well Patient left: in chair;with call bell/phone within reach;with family/visitor present     Time: 9767-3419 PT Time Calculation (min): 25 min  Charges:                       G Codes:      Brockton Mckesson 08/25/2014, 4:11 PM Levonne Hubert, SPT

## 2014-08-25 NOTE — Progress Notes (Signed)
Patient Name: [name] Date of Encounter: 08/25/2014     Active Problems:   Brain tumor    SUBJECTIVE  States he had diaphoresis and L arm pain with previous MI, however has not had any symptom recently. Denies any CP or SOB.  CURRENT MEDS . atorvastatin  40 mg Oral q1800  . dexamethasone  2 mg Intravenous 3 times per day  . docusate sodium  100 mg Oral BID  . levETIRAcetam  500 mg Oral BID  . metoprolol  25 mg Oral BID  . niacin  1,000 mg Oral QHS  . pantoprazole  40 mg Oral Daily    OBJECTIVE  Filed Vitals:   08/24/14 2340 08/25/14 0447 08/25/14 0801 08/25/14 0803  BP: 123/79 152/89  142/84  Pulse: 59 59  61  Temp: 97.9 F (36.6 C) 97.4 F (36.3 C) 98 F (36.7 C)   TempSrc: Oral Oral Oral   Resp: 21 21  22   Height:      Weight:      SpO2: 100% 100%  99%    Intake/Output Summary (Last 24 hours) at 08/25/14 0937 Last data filed at 08/25/14 0800  Gross per 24 hour  Intake   1870 ml  Output   3050 ml  Net  -1180 ml   Filed Weights   08/20/14 1144 08/23/14 1740  Weight: 260 lb (117.935 kg) 263 lb 0.1 oz (119.3 kg)    PHYSICAL EXAM  General: Pleasant, NAD. Neuro: Alert and oriented X 3. Moves all extremities spontaneously. Psych: Normal affect. HEENT:  Normal  Neck: Supple without bruits or JVD. Lungs:  Resp regular and unlabored, CTA. Heart: RRR no s3, s4, or murmurs. Abdomen: Soft, non-tender, non-distended, BS + x 4.  Extremities: No clubbing, cyanosis or edema. DP/PT/Radials 2+ and equal bilaterally.  Accessory Clinical Findings   TELE NSR with HR 50-60s, no significant ventricular ectopy    ECG  08/20/2014 AIVR with HR 70s.  Echocardiogram 08/21/2014  LV EF: 35% - 40%  ------------------------------------------------------------------- Indications: MI - old [greater than 8 weeks].  ------------------------------------------------------------------- History: PMH: Coronary artery disease. PMH: Myocardial infarction. Risk  factors: Hypertension. Dyslipidemia.  ------------------------------------------------------------------- Study Conclusions  - Left ventricle: The cavity size was mildly dilated. Systolic function was moderately reduced. The estimated ejection fraction was in the range of 35% to 40%. Probable peri-apical severe hypokinesis. Doppler parameters are consistent with abnormal left ventricular relaxation (grade 1 diastolic dysfunction). - Aortic valve: There was no stenosis. There was trivial regurgitation. - Aorta: Mildly dilated aortic root. Aortic root dimension: 39 mm (ED). - Mitral valve: Mildly calcified annulus. Mildly calcified leaflets . There was mild regurgitation. - Left atrium: The atrium was mildly dilated. - Right ventricle: Poorly visualized. - Right atrium: Poorly visualized. - Tricuspid valve: Poorly visualized. - Pulmonary arteries: No complete TR doppler jet so unable to estimate PA systolic pressure. - Systemic veins: IVC not visualized. - Pericardium, extracardiac: A trivial pericardial effusion was identified posterior to the heart.  Impressions:  - Technically difficult study. The apical images were basically uninterpretable despite use of Definity. Mildly dilated LV, EF probably around 35-40% with peri-apical severe hypokinesis. RV was not visualized. Mild MR. Could consider cardiac MRI to obtain more accurate determination of LV and RV function.      Radiology/Studies  Dg Chest 2 View  08/20/2014   CLINICAL DATA:  Preoperative evaluation for brain tumor resection.  EXAM: CHEST  2 VIEW  COMPARISON:  None.  FINDINGS: Sternotomy wires are intact.  Lungs are adequately inflated with minimal linear density in the left base likely scarring. There is blunting of the left costophrenic angle likely pleural parenchymal scarring. There is subtle focal density along the posterior left pleural surface on the lateral film. Cardiomediastinal silhouette is within normal.  There are minimal degenerative changes of the spine.  IMPRESSION: No acute cardiopulmonary disease.  Linear density left base likely atelectasis. Subtle focal density along the pleural surface of the posterior left lung base on the lateral film likely atelectasis or pleural-parenchymal scarring, however cannot exclude a parenchymal or pleural based mass. Consider noncontrast chest CT for further evaluation.   Electronically Signed   By: Marin Olp M.D.   On: 08/20/2014 14:10   Mr Jeri Cos IZ Contrast  08/21/2014   CLINICAL DATA:  79 year old male post intracranial tumor resection. Subsequent encounter.  EXAM: MRI HEAD WITHOUT AND WITH CONTRAST  TECHNIQUE: Multiplanar, multiecho pulse sequences of the brain and surrounding structures were obtained without and with intravenous contrast.  CONTRAST:  77mL MULTIHANCE GADOBENATE DIMEGLUMINE 529 MG/ML IV SOLN  COMPARISON:  08/19/2014.  FINDINGS: Post right frontal craniotomy for resection of large right temporal lobe mass. Moderate pneumocephalus greater on the right with displacement of the frontal lobes posteriorly and slightly to the left.  In the operative cavity, fluid and small amount hemorrhage is noted.  Enhancement along the periphery of the operative site most notable superior margin may be related to residual tumor.  Prominent surrounding T2/FLAIR altered signal intensity extends into remainder of the temporal lobe, basal ganglia and right periatrial region. This may represent vasogenic edema which will resolve however, depending on the type tumor, this appearance may also represent combination of tumor cells and edema. The present examination will represent the patient's first postoperative baseline exam to which follow-up exam can be compared to determine if this represents residual tumor.  Mass effect upon the left lateral ventricle. 1 cm midline shift to the left. Mild prominence left lateral ventricle unchanged.  Small infarct along the superior  posterior margin of the resection site.  IMPRESSION: Post right frontal craniotomy for resection of large right temporal lobe mass. Moderate pneumocephalus greater on the right with displacement of the frontal lobes posteriorly and slightly to the left.  Enhancement along the periphery of the operative site most notable superior margin may be related to residual tumor.  Prominent surrounding T2/FLAIR altered signal intensity extends into remainder of the temporal lobe, basal ganglia and right periatrial region. This may represent vasogenic edema which will resolve however, depending on the type tumor, this appearance may also represent combination of tumor cells and edema. The present examination will represent the patient's first postoperative baseline exam to which follow-up exam can be compared to determine if this represents residual tumor.  Mass effect upon the left lateral ventricle. 1 cm midline shift to the left. Mild prominence left lateral ventricle unchanged.  Small infarct along the superior posterior margin of the resection site.  These results will be called to the ordering clinician or representative by the Radiologist Assistant, and communication documented in the PACS or zVision Dashboard.   Electronically Signed   By: Chauncey Cruel M.D.   On: 08/21/2014 15:23   Mr Jeri Cos TI Contrast  08/19/2014   CLINICAL DATA:  Brain tumor.  EXAM: MRI HEAD WITHOUT AND WITH CONTRAST  TECHNIQUE: Multiplanar, multiecho pulse sequences of the brain and surrounding structures were obtained without and with intravenous contrast.  CONTRAST:  20 mL MultiHance  COMPARISON:  08/14/2014 brain MRI from [facility] [place]  FINDINGS: There is no acute infarct. T2 hyperintense mass in the right temporal lobe measures approximately 5.4 x 4.4 x 4.6 cm, not significantly unchanged in size from recent outside MRI. The mass demonstrates irregular, predominantly peripheral enhancement with evidence of central necrosis. A  small amount of susceptibility artifact centrally within the lesion may reflect old blood products. The mass partially compresses and medially displaces the temporal horn of the right lateral ventricle. Moderate surrounding edema does not appear significantly changed and partially extends into the right parietal white matter as well as the posterior internal and external capsules. 6 mm of leftward midline shift has not significantly changed. There is mild mass effect on the right midbrain.  No other enhancing lesions are identified. Scattered, small foci of T2 hyperintensity in the cerebral white matter elsewhere are nonspecific but compatible with mild chronic small vessel ischemic disease. There is mild to moderate cerebral atrophy. There is no extra-axial fluid collection.  Orbits are unremarkable. Tiny left maxillary sinus mucous retention cyst is noted. Mastoid air cells are clear. Major intracranial vascular flow voids are preserved.  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   Electronically Signed   By: Logan Bores   On: 08/19/2014 14:24   Dg Chest Port 1 View  08/20/2014   CLINICAL DATA:  Status post central line placement.  EXAM: PORTABLE CHEST - 1 VIEW  COMPARISON:  08/20/2014  FINDINGS: Patient has had median sternotomy and CABG. A left subclavian central venous line has been placed, tip overlying the level of the left brachycephalic -SVC confluence. No pneumothorax. There is left basilar opacity consistent with atelectasis or developing infiltrate. There is mild pulmonary vascular congestion. No overt edema.  IMPRESSION: 1. Interval placement of left-sided central line, tip overlying left brachycephalic -SVC confluence. 2. Developing left lower lobe infiltrate/atelectasis. 3. Pulmonary vascular congestion.   Electronically Signed   By: Shon Hale M.D.   On: 08/20/2014 18:42     ASSESSMENT AND PLAN  ADVAY VOLANTE is a 79 y.o. male with a hx of HTN, GERD and CAD s/p multiple stents and CABG 2004, no ischemia on nuclear stress in 2013, with large apical and inferior wall scar. EF 45%. Unfortunately he was found to have a large right temporal tumor after presenting with mental status changes, and underwent cranitomy yesteday. He was noted to be in AIVR, rate of 80 after the procedure, and cardiology was consulted.   1. AIVR    - AIVR could be slow "scar" reentry arrhythmia related to old inferior MI or related to hyperadrenergic state post craniotomy.  - Echo 08/21/2014 EF 35-40%, peri-apical severe hypokinesis, grade 1 diastolic dysfunc, trivial pericardial effusion. Poor study, could consider MRI. - per Dr. 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 Will arrange f/u with Dr. Martin[place]  2. Prolonged QT 3. CAD s/p CABG 2004 and multiple stents 4. HTN 5. 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 HLD  Signed, Almyra Deforest PA-C Pager: [phone] Patient seen and examined and history reviewed. Agree with above findings and plan. Patient is awake and alert. No cardiac complaints. No AIVR seen on telemetry. NSR with occ. PVCs. This is probably related  to hyperadrenergic state post op. Would continue beta blocker. We will sign off for now. Please call with other questions/concerns.  Lucianna Ostlund [place], [facility] 08/25/2014 11:53 AM

## 2014-08-25 NOTE — Progress Notes (Signed)
1845 - pt arrived to unit from 3S. No distress noted. Assessment performed as charted  Angeline Slim I 08/25/2014 7:01 PM

## 2014-08-25 NOTE — Care Management Note (Signed)
    Page 1 of 1   08/25/2014     12:04:36 PM CARE MANAGEMENT NOTE 08/25/2014  Patient:  [name], [name]   Account Number:  [id number]  Date Initiated:  08/25/2014  Documentation initiated by:  Marvetta Gibbons  Subjective/Objective Assessment:   Pt admitted wtih brain tumor s/p CRANIOTOMY TUMOR EXCISION w/ BrainLab     Action/Plan:   PTA pt lived at home with wife- PT eval recommendation for CIR- CIR consulted   Anticipated DC Date:  08/27/2014   Anticipated DC Plan:  [place]  CM consult      Choice offered to / List presented to:             Status of service:  In process, will continue to follow Medicare Important Message given?  YES (If response is "NO", the following Medicare IM given date fields will be blank) Date Medicare IM given:  08/25/2014 Medicare IM given by:  Marvetta Gibbons Date Additional Medicare IM given:   Additional Medicare IM given by:    Discharge Disposition:    Per UR Regulation:  Reviewed for med. necessity/level of care/duration of stay  If discussed at [place] of Stay Meetings, dates discussed:   08/26/2014    Comments:  08/25/14- 1200- Marvetta Gibbons RN, BSN [phone] Spoke with wife at bedside- hopeful for CIR for rehab- CIR has been consutled- plan to move to neuro floor today- pathology came back consistent with glioblastoma- plan to consult oncology

## 2014-08-25 NOTE — Progress Notes (Signed)
Reviewed and agree with current POC.  Alben Deeds, [place] DPT  [phone]

## 2014-08-25 NOTE — Progress Notes (Signed)
Utilization review completed.  

## 2014-08-25 NOTE — Consult Note (Signed)
Physical Medicine and Rehabilitation Consult Reason for Consult: Glioblastoma Referring Physician: Dr. [name]   HPI: [name] is a 79 y.o. right-handed male with history of CAD/MI/CABG. Independent prior to admission living with his wife. Admitted 08/20/2014 with mental status changes that steadily worsened over time. MRI and imaging revealed a large right temporal tumor. Underwent right temporal parietal craniotomy for tumor resection using brain lab neuro navigation 08/20/2014 per Dr. [name]. Maintained on Decadron protocol. Hospital course followup cardiology services consulted for accelerated idioventricular rhythm felt to be related to intracranial process. Continue to monitor with conservative care. Pathology returned consistent with glioblastoma await plan of care per neurosurgery for chemoradiation therapy. Physical therapy evaluation completed 08/22/2014 with recommendations for physical medicine rehabilitation consult.   Review of Systems  Musculoskeletal: Positive for myalgias.  Neurological: Positive for dizziness.  Psychiatric/Behavioral: Positive for memory loss.  All other systems reviewed and are negative.  Past Medical History  Diagnosis Date  . Coronary artery disease   . MI, old     INFERIOR  . Hypertension   . Hyperlipidemia   . Chronic venous insufficiency     and varicose veins  . Anterior myocardial infarction 1990    with subsequent PTCA  . Hypercholesterolemia   . Ulcer of right leg   . Obstructive sleep apnea   . Anemia   . Obesity    Past Surgical History  Procedure Laterality Date  . Cardiac catheterization  06/30/2003    EF 50%  . Coronary artery bypass graft  01/2003    X6. LIMA GRAFT TO THE LDA, FREE RADICAL GRAFT TO THE OM1, SEQUENTIAL SAPHENOUS VEIN GRAFT TO THE FIRST AND SECOND DIAGONAL BRANCES, AND A SEQUENTIAL  VEIN GRAFT TO THE PDA AND POSTERIOR LATERAL BRANCHES OF THE RIGHT CORONARY  . Coronary angioplasty  06/2003   POSTERIOR LATERAL BRANCH  . Tonsillectomy and adenoidectomy    . Cardiovascular stress test  01/23/2009    EF 44%  . Craniotomy Right 08/20/2014    Procedure: CRANIOTOMY TUMOR EXCISION w/ BrainLab;  Surgeon: Newman Pies, MD;  Location: [facility];  Service: Neurosurgery;  Laterality: Right;  Right Craniotomy with brain lab for tumor   Family History  Problem Relation Age of Onset  . Cancer Sister   . Heart disease    . Diabetes Brother    Social History:  reports that he has never smoked. He has never used smokeless tobacco. He reports that he does not drink alcohol or use illicit drugs. Allergies: No Known Allergies Medications Prior to Admission  Medication Sig Dispense Refill  . aspirin EC 325 MG tablet Take 325 mg by mouth daily.      . Cholecalciferol (VITAMIN D-3 PO) Take 1,000 Units by mouth daily.       Marland Kitchen dexamethasone (DECADRON) 2 MG tablet Take 2 mg by mouth 3 (three) times daily with meals.      Marland Kitchen KRILL OIL PO Take 1 capsule by mouth every 30 (thirty) days. No specific day.      . metoprolol (LOPRESSOR) 50 MG tablet Take 0.5 tablets (25 mg total) by mouth 2 (two) times daily.  30 tablet  6  . mupirocin ointment (BACTROBAN) 2 % Apply 1 application topically 2 (two) times daily.      Marland Kitchen neomycin-polymyxin-pramoxine (NEOSPORIN PLUS) 1 % cream Apply 1 application topically 2 (two) times daily.      . niacin (NIASPAN) 1000 MG CR tablet Take 1,000 mg by mouth at  bedtime.        . simvastatin (ZOCOR) 80 MG tablet Take 40 mg by mouth at bedtime.       Marland Kitchen Ubiquinol 50 MG CAPS Take 1 capsule by mouth every 30 (thirty) days. No specific day.      . nitroGLYCERIN (NITROSTAT) 0.4 MG SL tablet Place 1 tablet (0.4 mg total) under the tongue every 5 (five) minutes as needed.  25 tablet  6    Home: Home Living Family/patient expects to be discharged to:: Private residence Living Arrangements: Spouse/significant other Available Help at Discharge: Family Type of Home: House Home Access:  Level entry Home Layout: One level Home Equipment: None  Functional History: Prior Function Level of Independence: Independent Functional Status:  Mobility: Bed Mobility Overal bed mobility: Needs Assistance Bed Mobility: Supine to Sit Supine to sit: Min guard General bed mobility comments: increased time and Max VCs to perform Transfers Overall transfer level: Needs assistance Equipment used: 2 person hand held assist Transfers: Sit to/from [facility] Sit to Stand: +2 physical assistance;Mod assist Stand pivot transfers: +2 physical assistance;Mod assist General transfer comment: Max cues for safety and mobility. patient with significant short shuffling steps, at times appears to have lost attnetion to task and shuffled in place without acutaly moving. Manual cues to direct to task and continue movement      ADL:    Cognition: Cognition Overall Cognitive Status: Impaired/Different from baseline Orientation Level: Oriented to person;Oriented to place;Oriented to situation;Disoriented to place;Disoriented to time Cognition Arousal/Alertness: Lethargic Behavior During Therapy: Flat affect Overall Cognitive Status: Impaired/Different from baseline Area of Impairment: Attention;Memory;Following commands;Safety/judgement;Awareness;Problem solving Current Attention Level: Focused Memory: Decreased short-term memory Following Commands: Follows one step commands inconsistently;Follows one step commands with increased time Safety/Judgement: Decreased awareness of safety;Decreased awareness of deficits Awareness: Intellectual Problem Solving: Slow processing;Decreased initiation;Difficulty sequencing;Requires verbal cues;Requires tactile cues General Comments: Patient extremely limited attention unsure if limited by attention,   Blood pressure 144/85, pulse 75, temperature 98 F (36.7 C), temperature source Oral, resp. rate 22, height 6\' 2"  (1.88 m), weight 119.3 kg  (263 lb 0.1 oz), SpO2 99.00%. Physical Exam  Constitutional: He appears well-developed.  Eyes: EOM are normal.  Neck: Normal range of motion. Neck supple. No thyromegaly present.  Cardiovascular: Normal rate and regular rhythm.   Respiratory: Effort normal and breath sounds normal. No respiratory distress.  GI: Soft. Bowel sounds are normal. He exhibits no distension.  Neurological: He is alert.  Patient  was cooperative. He was able to provide his name and age but needed cues for date of birth. Very alert. RUE 4+. LUE 4. LE's 3+ prox, 4/5 knees, 4+ ankles. Sensory 1+ left upper ext.   Skin:  Craniotomy site clean and dry  Psychiatric: He has a normal mood and affect. His behavior is normal.    Results for orders placed during the hospital encounter of 08/20/14 (from the past 24 hour(s))  GLUCOSE, CAPILLARY     Status: Abnormal   Collection Time    08/24/14 11:10 AM      Result Value Ref Range   Glucose-Capillary 138 (*) 70 - 99 mg/dL   Comment 1 Documented in Chart     Comment 2 Notify RN    GLUCOSE, CAPILLARY     Status: Abnormal   Collection Time    08/24/14  9:30 PM      Result Value Ref Range   Glucose-Capillary 181 (*) 70 - 99 mg/dL   Comment 1 Notify RN  GLUCOSE, CAPILLARY     Status: Abnormal   Collection Time    08/25/14  8:03 AM      Result Value Ref Range   Glucose-Capillary 140 (*) 70 - 99 mg/dL   Comment 1 Notify RN     Comment 2 Documented in Chart     No results found.  Assessment/Plan: Diagnosis: right temporal GBM s/p resection 1. Does the need for close, 24 hr/day medical supervision in concert with the patient's rehab needs make it unreasonable for this patient to be served in a less intensive setting? Yes and Potentially 2. Co-Morbidities requiring supervision/potential complications: pain, wound care 3. Due to bladder management, bowel management, safety, skin/wound care, disease management, medication administration, pain management and patient  education, does the patient require 24 hr/day rehab nursing? Yes and Potentially 4. Does the patient require coordinated care of a physician, rehab nurse, PT (1-2 hrs/day, 5 days/week), OT (1-2 hrs/day, 5 days/week) and SLP (1-2 hrs/day, 5 days/week) to address physical and functional deficits in the context of the above medical diagnosis(es)? Yes Addressing deficits in the following areas: balance, endurance, locomotion, strength, transferring, bowel/bladder control, bathing, dressing, feeding, grooming, toileting, cognition, speech and psychosocial support 5. Can the patient actively participate in an intensive therapy program of at least 3 hrs of therapy per day at least 5 days per week? Yes 6. The potential for patient to make measurable gains while on inpatient rehab is excellent 7. Anticipated functional outcomes upon discharge from inpatient rehab are modified independent  with PT, modified independent with OT, modified independent with SLP. 8. Estimated rehab length of stay to reach the above functional goals is: 7 days if needed 9. Does the patient have adequate social supports to accommodate these discharge functional goals? Yes 10. Anticipated D/C setting: Home 11. Anticipated post D/C treatments: HH therapy and Outpatient therapy 12. Overall Rehab/Functional Prognosis: excellent  RECOMMENDATIONS: This patient's condition is appropriate for continued rehabilitative care in the following setting: CIR Patient has agreed to participate in recommended program. Yes Note that insurance prior authorization may be required for reimbursement for recommended care.  Comment: Pt displayed improvement today. Depending upon functional progress tomorrow, could consider brief inpatient rehab stay vs home with home health or outpt services. Rehab Admissions Coordinator to follow up.  Thanks,  Meredith Staggers, MD, [facility] Drown     08/25/2014

## 2014-08-25 NOTE — Progress Notes (Signed)
Patient ID: [name], male   DOB: 26-Oct-1935, 79 y.o.   MRN: [id number] Subjective:  The patient is somnolent but easily arousable. He is pleasant. He has no complaints.  Objective: Vital signs in last 24 hours: Temp:  [97.4 F (36.3 C)-98.1 F (36.7 C)] 98 F (36.7 C) (09/21 0801) Pulse Rate:  [59-83] 61 (09/21 0803) Resp:  [14-23] 22 (09/21 0803) BP: (123-152)/(75-89) 142/84 mmHg (09/21 0803) SpO2:  [97 %-100 %] 99 % (09/21 0803)  Intake/Output from previous day: 09/20 0701 - 09/21 0700 In: 2160 [P.O.:960; I.V.:1200] Out: 3050 [Urine:3050] Intake/Output this shift: Total I/O In: 50 [I.V.:50] Out: -   Physical exam the patient is somnolent but easily arousable. He moves all 4 extremities. His speech is normal. He is mildly confused. His wound is healing well.  Lab Results: No results found for this basename: WBC, HGB, HCT, PLT,  in the last 72 hours BMET No results found for this basename: NA, K, CL, CO2, GLUCOSE, BUN, CREATININE, CALCIUM,  in the last 72 hours  Studies/Results: No results found.  Assessment/Plan: Postop day 5: The pathology came back consistent with glioblastoma. I discussed his diagnosis with the patient and his wife. We discussed further treatment with chemotherapy and ration therapy. They were agreeable. I will make the appropriate consults. We will transfer to the floor.  LOS: 5 days     Talene Glastetter D 08/25/2014, 9:30 AM

## 2014-08-25 NOTE — Progress Notes (Signed)
I will follow up with pt's progress tomorrow to assist in determining rehab venue needs. [phone]

## 2014-08-26 ENCOUNTER — Ambulatory Visit
Admit: 2014-08-26 | Discharge: 2014-08-26 | Disposition: A | Discharge status: Home / Self Care | Payer: Medicare Other | Attending: Radiation Oncology | Admitting: Radiation Oncology

## 2014-08-26 DIAGNOSIS — I2510 Atherosclerotic heart disease of native coronary artery without angina pectoris: Secondary | ICD-10-CM

## 2014-08-26 DIAGNOSIS — R4182 Altered mental status, unspecified: Secondary | ICD-10-CM

## 2014-08-26 DIAGNOSIS — D496 Neoplasm of unspecified behavior of brain: Secondary | ICD-10-CM

## 2014-08-26 DIAGNOSIS — C711 Malignant neoplasm of frontal lobe: Secondary | ICD-10-CM

## 2014-08-26 DIAGNOSIS — G4730 Sleep apnea, unspecified: Secondary | ICD-10-CM

## 2014-08-26 MED ORDER — DEXAMETHASONE 2 MG PO TABS
2.0000 mg | ORAL_TABLET | Freq: Two times a day (BID) | ORAL | Status: DC
  Administered 2014-08-26 – 2014-08-27 (×3): 2 mg via ORAL
  Filled 2014-08-26 (×3): qty 1

## 2014-08-26 MED ORDER — DEXAMETHASONE SODIUM PHOSPHATE 4 MG/ML IJ SOLN: 2.0000 mg | Freq: Two times a day (BID) | INTRAMUSCULAR | Status: DC

## 2014-08-26 MED ORDER — DSS 100 MG PO CAPS: 100.0000 mg | ORAL_CAPSULE | Freq: Two times a day (BID) | ORAL | Status: DC

## 2014-08-26 MED ORDER — POLYETHYLENE GLYCOL 3350 17 G PO PACK
17.0000 g | PACK | Freq: Two times a day (BID) | ORAL | Status: DC
  Administered 2014-08-26 – 2014-08-27 (×2): 17 g via ORAL
  Filled 2014-08-26 (×2): qty 1

## 2014-08-26 MED ORDER — LEVETIRACETAM 500 MG PO TABS: 500.0000 mg | ORAL_TABLET | Freq: Two times a day (BID) | ORAL | Status: DC

## 2014-08-26 MED ORDER — DEXAMETHASONE 2 MG PO TABS
2.0000 mg | ORAL_TABLET | Freq: Two times a day (BID) | ORAL | Status: DC
Start: 2014-08-26 — End: 2014-09-26

## 2014-08-26 NOTE — Clinical Social Work Psychosocial (Signed)
Clinical Social Work Department BRIEF PSYCHOSOCIAL ASSESSMENT 08/26/2014  Patient:  [name], [name]     Account Number:  [id number]     Admit date:  08/20/2014  Clinical Social Worker:  Marciano Sequin  Date/Time:  08/26/2014 03:03 PM  Referred by:  RN  Date Referred:  08/26/2014 Referred for  SNF Placement   Other Referral:   Interview type:  Patient Other interview type:    PSYCHOSOCIAL DATA Living Status:  WIFE Admitted from facility:   Level of care:   Primary support name:  Leopoldo Mazzie [phone]), Son Grayland Ormond [phone] Primary support relationship to patient:  FAMILY Degree of support available:   Strong    CURRENT CONCERNS  Other Concerns:   The pt's son expressed concern about weather the pt was being dishcarge to soon given his current surgery.    SOCIAL WORK ASSESSMENT / PLAN CSW met pt, pt's wife, pt's son (via speaker phone) at bedside. CSW introduce self and purpose of visit. Pt presented with a normal affect and calm mood. CSW and family discussed the alternative rehab placement. Pt's son reported not understand why the pt was not accepted into inpatient rehab.  CSW explained the SNF process to the pt. CSW and family reviewed the SNF list. CSW provided family with contact information for further questions. CSW will continue to follow this pt and assist with discharge as needed.   Assessment/plan status:  Information/Referral to [employer] Other assessment/ plan:   Information/referral to community resources:   SNF list    PATIENT'S/FAMILY'S RESPONSE TO PLAN OF CARE: agreed   Greta Doom, MSW, [place]

## 2014-08-26 NOTE — Clinical Social Work Placement (Addendum)
Clinical Social Work Department CLINICAL SOCIAL WORK PLACEMENT NOTE 08/26/2014  Patient:  [name], [name]  Account Number:  [id number] Admit date:  08/20/2014  Clinical Social Worker:  Greta Doom, LCSWA  Date/time:  08/26/2014 03:13 PM  Clinical Social Work is seeking post-discharge placement for this patient at the following level of care:   SKILLED NURSING   (*CSW will update this form in Epic as items are completed)   08/26/2014  Patient/family provided with [facility] Department of Clinical Social Work's list of facilities offering this level of care within the geographic area requested by the patient (or if unable, by the patient's family).  08/26/2014  Patient/family informed of their freedom to choose among providers that offer the needed level of care, that participate in Medicare, Medicaid or managed care program needed by the patient, have an available bed and are willing to accept the patient.  08/26/2014  Patient/family informed of [facility]' ownership interest in [facility], as well as of the fact that they are under no obligation to receive care at this facility.  PASARR submitted to EDS on 08/26/2014 PASARR number received on 08/26/2014  FL2 transmitted to all facilities in geographic area requested by pt/family on  08/26/2014 FL2 transmitted to all facilities within larger geographic area on 08/26/2014  Patient informed that his/her managed care company has contracts with or will negotiate with  certain facilities, including the following:     Patient/family informed of bed offers received:  08/27/2014 Patient chooses bed at [facility]  Physician recommends and patient chooses bed at    Patient to be transferred [facility] [place]  on 08/27/2014 Patient to be transferred to facility by [facility] Patient and family notified of transfer on 08/27/2014 Name of family member notified:   Pt's wife Izora Gala at beside.   The following physician request were entered in Epic:   Additional Comments: [place], MSW, [place]

## 2014-08-26 NOTE — Progress Notes (Signed)
Patient ID: [name], male   DOB: Nov 14, 1935, 79 y.o.   MRN: [id number] Subjective:  The patient is alert and pleasant. He is in no apparent distress. He denies pain.  Objective: Vital signs in last 24 hours: Temp:  [97.1 F (36.2 C)-98.1 F (36.7 C)] 97.5 F (36.4 C) (09/22 0558) Pulse Rate:  [54-75] 54 (09/22 0558) Resp:  [18-22] 18 (09/22 0558) BP: (125-144)/(66-85) 131/75 mmHg (09/22 0558) SpO2:  [97 %-99 %] 98 % (09/22 0558)  Intake/Output from previous day: 09/21 0701 - 09/22 0700 In: 450 [I.V.:450] Out: 2750 [Urine:2750] Intake/Output this shift:    Physical exam the patient is alert and oriented x2. His speech is normal. His strength is grossly normal. His incision is healing well.  Lab Results: No results found for this basename: WBC, HGB, HCT, PLT,  in the last 72 hours BMET No results found for this basename: NA, K, CL, CO2, GLUCOSE, BUN, CREATININE, CALCIUM,  in the last 72 hours  Studies/Results: No results found.  Assessment/Plan: Postop day # 6: The patient is making progress. We are awaiting disposition from rehabilitation. I will continue to wean his Decadron.  LOS: 6 days     Adael Culbreath D 08/26/2014, 7:25 AM

## 2014-08-26 NOTE — Consult Note (Signed)
New Hematology/Oncology Consult   Referral MD: Newman Pies     Reason for Referral: Glioblastoma     HPI: His wife reports Mr. Teall develop confusion and saw Dr. Joylene Draft. An MRI of the brain 08/19/2014 revealed a T2 hyperintense mass in the right temporal lobe measuring 5.4 x 4.4 x 4.6 cm. The mass demonstrated peripheral enhancement and central necrosis. Moderate surrounding edema with 6 mm midline shift. No other enhancing lesion.  He was referred to Dr. [name] and was taken the operating room on 08/21/1999 1540 right temporoparietal craniotomy and tumor resection. An anterior temporal lobectomy was performed. All of the addendum I tumor was removed.  The pathology ([id number]) revealed a glioblastoma.  He is recovering from surgery and plans to complete a rehabilitation stay.    Past Medical History  Diagnosis Date  . Coronary artery disease   . MI, old     INFERIOR  . Hypertension   . Hyperlipidemia   . Chronic venous insufficiency     and varicose veins  . Anterior myocardial infarction 1990    with subsequent PTCA  . Hypercholesterolemia   . Ulcer of right leg   . Obstructive sleep apnea       . Obesity   :  Past Surgical History  Procedure Laterality Date  . Cardiac catheterization  06/30/2003    EF 50%  . Coronary artery bypass graft  01/2003    X6. LIMA GRAFT TO THE LDA, FREE RADICAL GRAFT TO THE OM1, SEQUENTIAL SAPHENOUS VEIN GRAFT TO THE FIRST AND SECOND DIAGONAL BRANCES, AND A SEQUENTIAL  VEIN GRAFT TO THE PDA AND POSTERIOR LATERAL BRANCHES OF THE RIGHT CORONARY  . Coronary angioplasty  06/2003    POSTERIOR LATERAL BRANCH  . Tonsillectomy and adenoidectomy and circumcision    child   . Cardiovascular stress test  01/23/2009    EF 44%  . Craniotomy Right 08/20/2014    Procedure: CRANIOTOMY TUMOR EXCISION w/ BrainLab;  Surgeon: Newman Pies, MD;  Location: [place] [facility];  Service: Neurosurgery;  Laterality: Right;  Right Craniotomy with brain lab for  tumor  :  Current facility-administered medications:0.9 % NaCl with KCl 20 mEq/ L  infusion, , Intravenous, Continuous, Newman Pies, MD, Last Rate: 50 mL/hr at 08/25/14 2221;  acetaminophen (TYLENOL) suppository 650 mg, 650 mg, Rectal, Q4H PRN, Newman Pies, MD;  acetaminophen (TYLENOL) tablet 650 mg, 650 mg, Oral, Q4H PRN, Newman Pies, MD;  atorvastatin (LIPITOR) tablet 40 mg, 40 mg, Oral, q1800, Newman Pies, MD, 40 mg at 08/26/14 1751 dexamethasone (DECADRON) tablet 2 mg, 2 mg, Oral, Q12H, Newman Pies, MD, 2 mg at 08/26/14 0950;  docusate sodium (COLACE) capsule 100 mg, 100 mg, Oral, BID, Newman Pies, MD, 100 mg at 08/26/14 0950;  labetalol (NORMODYNE,TRANDATE) injection 10-40 mg, 10-40 mg, Intravenous, Q10 min PRN, Newman Pies, MD, 20 mg at 08/21/14 0908;  levETIRAcetam (KEPPRA) tablet 500 mg, 500 mg, Oral, BID, Newman Pies, MD, 500 mg at 08/26/14 0949 metoprolol tartrate (LOPRESSOR) tablet 25 mg, 25 mg, Oral, BID, Newman Pies, MD, 25 mg at 08/26/14 1046;  niacin (NIASPAN) CR tablet 1,000 mg, 1,000 mg, Oral, QHS, Newman Pies, MD, 1,000 mg at 08/25/14 2230;  nitroGLYCERIN (NITROSTAT) SL tablet 0.4 mg, 0.4 mg, Sublingual, Q5 min PRN, Newman Pies, MD;  ondansetron [facility]) injection 4 mg, 4 mg, Intravenous, Q4H PRN, Newman Pies, MD ondansetron [facility]) tablet 4 mg, 4 mg, Oral, Q4H PRN, Newman Pies, MD;  pantoprazole (PROTONIX) EC tablet 40 mg, 40 mg, Oral, Daily, Dellis Filbert  [name], MD, 40 mg at 08/26/14 0950;  polyethylene glycol (MIRALAX / GLYCOLAX) packet 17 g, 17 g, Oral, BID, Newman Pies, MD, 17 g at 08/26/14 1156:  . atorvastatin  40 mg Oral q1800  . dexamethasone  2 mg Oral Q12H  . docusate sodium  100 mg Oral BID  . levETIRAcetam  500 mg Oral BID  . metoprolol  25 mg Oral BID  . niacin  1,000 mg Oral QHS  . pantoprazole  40 mg Oral Daily  . polyethylene glycol  17 g Oral BID  :  No Known Allergies:  Family History  Problem Relation Age  of Onset  . Cancer-:  Sister  40s   . Heart disease    . Diabetes Brother   :  History   Social History  . Marital Status: Married    Spouse Name: Izora Gala    Number of Children: 3  . Years of Education: College   Occupational History  . [employer] retired    Social History Main Topics  . Smoking status: Never Smoker   . Smokeless tobacco: Never Used  . Alcohol Use: No  . Drug Use: No  . Sexual Activity: Not on file    Social History Narrative   Patient is married Izora Gala).   Patient has three children.   Patient has a college education.   Patient is retired.   Patient drinks very little caffeine.   Patient is right-handed.        :  Review of Systems:  Positives include: "Head pressure "prior to hospital admission, confusion  A complete ROS was otherwise negative.   Physical Exam:  Blood pressure 103/60, pulse 58, temperature 98.1 F (36.7 C), temperature source Oral, resp. rate 20, height $RemoveBe'6\' 2"'BqijIBiuM$  (1.88 m), weight 263 lb 0.1 oz (119.3 kg), SpO2 98.00%.  HEENT: Healing right craniotomy incision, no thrush Lungs: Decreased breath sounds at the lower posterior chest bilaterally, no respiratory Cardiac: Regular rate and rhythm Abdomen: No hepatosplenomegaly GU: Testes without mass  Vascular: No leg edema, bilateral low leg varicosities, chronic stasis change at the right lower leg, healing medial right lower leg ulcer, several thrombosed varicosities without erythema or tenderness at the right lower leg Lymph nodes: No cervical, supraclavicular, axillary, or inguinal nodes Neurologic: Alert and oriented, follows commands, slow to answer questions, the motor exam appears intact in the upper and lower extremity Skin: No rash Musculoskeletal: No spine  LABS: 08/20/2014-hemoglobin 13.9, platelets 259,000, white count 10.4, creatinine 0.98   RADIOLOGY:  Dg Chest 2 View  08/20/2014   CLINICAL DATA:  Preoperative evaluation for brain tumor resection.  EXAM: CHEST  2 VIEW   COMPARISON:  None.  FINDINGS: Sternotomy wires are intact. Lungs are adequately inflated with minimal linear density in the left base likely scarring. There is blunting of the left costophrenic angle likely pleural parenchymal scarring. There is subtle focal density along the posterior left pleural surface on the lateral film. Cardiomediastinal silhouette is within normal. There are minimal degenerative changes of the spine.  IMPRESSION: No acute cardiopulmonary disease.  Linear density left base likely atelectasis. Subtle focal density along the pleural surface of the posterior left lung base on the lateral film likely atelectasis or pleural-parenchymal scarring, however cannot exclude a parenchymal or pleural based mass. Consider noncontrast chest CT for further evaluation.   Electronically Signed   By: Marin Olp M.D.   On: 08/20/2014 14:10   Mr Jeri Cos MW Contrast  08/21/2014   CLINICAL DATA:  79 year old  male post intracranial tumor resection. Subsequent encounter.  EXAM: MRI HEAD WITHOUT AND WITH CONTRAST  TECHNIQUE: Multiplanar, multiecho pulse sequences of the brain and surrounding structures were obtained without and with intravenous contrast.  CONTRAST:  17mL MULTIHANCE GADOBENATE DIMEGLUMINE 529 MG/ML IV SOLN  COMPARISON:  08/19/2014.  FINDINGS: Post right frontal craniotomy for resection of large right temporal lobe mass. Moderate pneumocephalus greater on the right with displacement of the frontal lobes posteriorly and slightly to the left.  In the operative cavity, fluid and small amount hemorrhage is noted.  Enhancement along the periphery of the operative site most notable superior margin may be related to residual tumor.  Prominent surrounding T2/FLAIR altered signal intensity extends into remainder of the temporal lobe, basal ganglia and right periatrial region. This may represent vasogenic edema which will resolve however, depending on the type tumor, this appearance may also represent  combination of tumor cells and edema. The present examination will represent the patient's first postoperative baseline exam to which follow-up exam can be compared to determine if this represents residual tumor.  Mass effect upon the left lateral ventricle. 1 cm midline shift to the left. Mild prominence left lateral ventricle unchanged.  Small infarct along the superior posterior margin of the resection site.  IMPRESSION: Post right frontal craniotomy for resection of large right temporal lobe mass. Moderate pneumocephalus greater on the right with displacement of the frontal lobes posteriorly and slightly to the left.  Enhancement along the periphery of the operative site most notable superior margin may be related to residual tumor.  Prominent surrounding T2/FLAIR altered signal intensity extends into remainder of the temporal lobe, basal ganglia and right periatrial region. This may represent vasogenic edema which will resolve however, depending on the type tumor, this appearance may also represent combination of tumor cells and edema. The present examination will represent the patient's first postoperative baseline exam to which follow-up exam can be compared to determine if this represents residual tumor.  Mass effect upon the left lateral ventricle. 1 cm midline shift to the left. Mild prominence left lateral ventricle unchanged.  Small infarct along the superior posterior margin of the resection site.  These results will be called to the ordering clinician or representative by the Radiologist Assistant, and communication documented in the PACS or zVision Dashboard.   Electronically Signed   By: Bridgett Larsson M.D.   On: 08/21/2014 15:23   Mr Laqueta Jean HK Contrast  08/19/2014   CLINICAL DATA:  Brain tumor.  EXAM: MRI HEAD WITHOUT AND WITH CONTRAST  TECHNIQUE: Multiplanar, multiecho pulse sequences of the brain and surrounding structures were obtained without and with intravenous contrast.  CONTRAST:  20 mL  MultiHance  COMPARISON:  08/14/2014 brain MRI from [facility]  FINDINGS: There is no acute infarct. T2 hyperintense mass in the right temporal lobe measures approximately 5.4 x 4.4 x 4.6 cm, not significantly unchanged in size from recent outside MRI. The mass demonstrates irregular, predominantly peripheral enhancement with evidence of central necrosis. A small amount of susceptibility artifact centrally within the lesion may reflect old blood products. The mass partially compresses and medially displaces the temporal horn of the right lateral ventricle. Moderate surrounding edema does not appear significantly changed and partially extends into the right parietal white matter as well as the posterior internal and external capsules. 6 mm of leftward midline shift has not significantly changed. There is mild mass effect on the right midbrain.  No other enhancing lesions are identified. Scattered, small  foci of T2 hyperintensity in the cerebral white matter elsewhere are nonspecific but compatible with mild chronic small vessel ischemic disease. There is mild to moderate cerebral atrophy. There is no extra-axial fluid collection.  Orbits are unremarkable. Tiny left maxillary sinus mucous retention cyst is noted. Mastoid air cells are clear. Major intracranial vascular flow voids are preserved.  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   Electronically Signed   By: Logan Bores   On: 08/19/2014 14:24   Dg Chest Port 1 View  08/20/2014   CLINICAL DATA:  Status post central line placement.  EXAM: PORTABLE CHEST - 1 VIEW  COMPARISON:  08/20/2014  FINDINGS: Patient has had median sternotomy and CABG. A left subclavian central venous line has been placed, tip overlying the level of the left brachycephalic -SVC confluence. No pneumothorax. There is left basilar opacity consistent with  atelectasis or developing infiltrate. There is mild pulmonary vascular congestion. No overt edema.  IMPRESSION: 1. Interval placement of left-sided central line, tip overlying left brachycephalic -SVC confluence. 2. Developing left lower lobe infiltrate/atelectasis. 3. Pulmonary vascular congestion.   Electronically Signed   By: Shon Hale M.D.   On: 08/20/2014 18:42    Assessment and Plan:   1. A Glioblastoma Multiforme, status post resection of a right temporal mass 08/20/2014  2. altered mental status secondary to #1  3. history of coronary artery disease  4. lower extremity  varicosities with right lower extremity stasis changes  5. sleep apnea   Mr. Moes has been diagnosed with a glioblastoma of the right temporal brain. He underwent a gross total resection on 08/20/2014. He is recovering from surgery and will be discharged to a rehabilitation facility within the next few days.  I discussed the prognosis and treatment options with Mr. Rosiak and his wife. I recommend adjuvant radiation and temozolomide.  Recommendations: 1. Radiation oncology consult 2. Outpatient followup at the [facility] for further discussion and treatment planning on 09/04/2014 3. check for MGMT methylation and IDH1/ IDH2 mutations Please call oncology as needed. I will see him as an outpatient.    Betsy Coder, MD 08/26/2014, 5:59 PM

## 2014-08-26 NOTE — Progress Notes (Addendum)
Pt is appropriate for a short inpt rehab stay [facility] 5 to 7 days, But no inpt rehab bed is available for this pt today. I discussed with pt and then contacted his wife by phone. I will follow up tomorrow. [phone] I have discussed with his son, Grayland Ormond, by phone , and I will alert SW.

## 2014-08-26 NOTE — Discharge Summary (Signed)
Physician Discharge Summary  Patient ID: [name] MRN: [id number] DOB/AGE: 1935/10/06 79 y.o.  Admit date: 08/20/2014 Discharge date: 08/26/2014  Admission Diagnoses: Right temporal glioblastoma  Discharge Diagnoses: The same Active Problems:   Brain tumor   Discharged Condition: fair  Hospital Course: I performed a right craniotomy for resection of glioblastoma on 08/20/2014. The surgery went well.  The patient's postoperative course was unremarkable. He had some confusion and was slow to mobilize. We had PT and rehabilitation see the patient. Arrangements were made for him to be transferred to inpatient rehabilitation.  Consults: PT, rehabilitation Significant Diagnostic Studies: Brain MRI Treatments: Right temporal craniotomy are resection of glioblastoma using brain lab neuronavigation Discharge Exam: Blood pressure 131/75, pulse 54, temperature 97.5 F (36.4 C), temperature source Oral, resp. rate 18, height 6\' 2"  (1.88 m), weight 119.3 kg (263 lb 0.1 oz), SpO2 98.00%.  the patient is alert and pleasant. He is oriented x2. His incision is healing well. His speech is normal. His strength is normal.  Disposition: Rehabilitation  Discharge Instructions   Call MD for:  difficulty breathing, headache or visual disturbances    Complete by:  As directed      Call MD for:  extreme fatigue    Complete by:  As directed      Call MD for:  hives    Complete by:  As directed      Call MD for:  persistant dizziness or light-headedness    Complete by:  As directed      Call MD for:  persistant nausea and vomiting    Complete by:  As directed      Call MD for:  redness, tenderness, or signs of infection (pain, swelling, redness, odor or green/yellow discharge around incision site)    Complete by:  As directed      Call MD for:  severe uncontrolled pain    Complete by:  As directed      Call MD for:  temperature >100.4    Complete by:  As directed      Diet - low sodium  heart healthy    Complete by:  As directed      Discharge instructions    Complete by:  As directed   Call [phone] for a followup appointment. Take a stool softener while you are using pain medications.     Driving Restrictions    Complete by:  As directed   Do not drive for 2 weeks.     Increase activity slowly    Complete by:  As directed      Lifting restrictions    Complete by:  As directed   Do not lift more than 5 pounds. No excessive bending or twisting.     May shower / Bathe    Complete by:  As directed   He may shower after the pain she is removed 3 days after surgery. Leave the incision alone.     No dressing needed    Complete by:  As directed             Medication List    STOP taking these medications       aspirin EC 325 MG tablet     mupirocin ointment 2 %  Commonly known as:  BACTROBAN     neomycin-polymyxin-pramoxine 1 % cream  Commonly known as:  NEOSPORIN PLUS      TAKE these medications       dexamethasone 2 MG tablet  Commonly known as:  DECADRON  Take 1 tablet (2 mg total) by mouth every 12 (twelve) hours.     DSS 100 MG Caps  Take 100 mg by mouth 2 (two) times daily.     KRILL OIL PO  Take 1 capsule by mouth every 30 (thirty) days. No specific day.     levETIRAcetam 500 MG tablet  Commonly known as:  KEPPRA  Take 1 tablet (500 mg total) by mouth 2 (two) times daily.     metoprolol 50 MG tablet  Commonly known as:  LOPRESSOR  Take 0.5 tablets (25 mg total) by mouth 2 (two) times daily.     niacin 1000 MG CR tablet  Commonly known as:  NIASPAN  Take 1,000 mg by mouth at bedtime.     nitroGLYCERIN 0.4 MG SL tablet  Commonly known as:  NITROSTAT  Place 1 tablet (0.4 mg total) under the tongue every 5 (five) minutes as needed.     simvastatin 80 MG tablet  Commonly known as:  ZOCOR  Take 40 mg by mouth at bedtime.     Ubiquinol 50 MG Caps  Take 1 capsule by mouth every 30 (thirty) days. No specific day.     VITAMIN D-3 PO   Take 1,000 Units by mouth daily.           Follow-up Information   Follow up with Peter Martinique, MD On 10/24/2014. (8:00am)    Specialty:  Cardiology   Contact information:   [address] [place] [address] [phone]       Signed: Ophelia Charter 08/26/2014, 7:31 AM

## 2014-08-27 ENCOUNTER — Telehealth: Payer: Self-pay | Admitting: Oncology

## 2014-08-27 ENCOUNTER — Telehealth: Payer: Self-pay | Admitting: *Deleted

## 2014-08-27 ENCOUNTER — Other Ambulatory Visit: Payer: Self-pay | Admitting: *Deleted

## 2014-08-27 ENCOUNTER — Encounter: Payer: Self-pay | Admitting: Radiation Oncology

## 2014-08-27 NOTE — Progress Notes (Signed)
Pt was discharged to SNF. Discharge paperwork given to transportation

## 2014-08-27 NOTE — Progress Notes (Signed)
Radiation Oncology         [phone] ________________________________  Initial inpatient Consultation  Name: [name] MRN: [id number]  Date: 08/26/2014  DOB: Oct 05, 1935  QQ:VZDGLO,VFIE A, MD  Newman Pies, MD   REFERRING PHYSICIAN: Newman Pies, MD  DIAGNOSIS: Glioblastoma presenting in the right temporal brain  HISTORY OF PRESENT [place] is a 79 y.o. male who is seen out of the courtesy of Dr. Newman Pies for an opinion concerning radiation therapy as part of management of patient's recently diagnosed glioblastoma.. The patient presented  with confusion. He was seen by Dr. Joylene Draft  and MRI brain was performed which revealed a right temporal lobe mass measuring 5.4 x 4.4 x 4.6 cm. There was peripheral enhancement and central necrosis noted. Patient was seen by Dr. [name] in on September 16 was taken to the operating room at which time the patient underwent right temporoparietal craniotomy with gross total resection of tumor. The pathology from this procedure revealed a high-grade glioma consistent with glioblastoma. The patient has done well since his surgery except for continued problems with confusion.   Postoperative MRI showed good resection with possible residual disease along the superior operative bed. The patient was seen earlier today by medical oncology who has recommended recommending combined modality therapy.  The patient is now seen in radiation oncology for consideration for postoperative treatments.  PREVIOUS RADIATION THERAPY: No  PAST MEDICAL HISTORY:  has a past medical history of Coronary artery disease; MI, old; Hypertension; Hyperlipidemia; Chronic venous insufficiency; Anterior myocardial infarction (1990); Hypercholesterolemia; Ulcer of right leg; Obstructive sleep apnea; Anemia; and Obesity.    PAST SURGICAL HISTORY: Past Surgical History  Procedure Laterality Date  . Cardiac catheterization  06/30/2003    EF 50%  . Coronary  artery bypass graft  01/2003    X6. LIMA GRAFT TO THE LDA, FREE RADICAL GRAFT TO THE OM1, SEQUENTIAL SAPHENOUS VEIN GRAFT TO THE FIRST AND SECOND DIAGONAL BRANCES, AND A SEQUENTIAL  VEIN GRAFT TO THE PDA AND POSTERIOR LATERAL BRANCHES OF THE RIGHT CORONARY  . Coronary angioplasty  06/2003    POSTERIOR LATERAL BRANCH  . Tonsillectomy and adenoidectomy    . Cardiovascular stress test  01/23/2009    EF 44%  . Craniotomy Right 08/20/2014    Procedure: CRANIOTOMY TUMOR EXCISION w/ BrainLab;  Surgeon: Newman Pies, MD;  Location: [facility];  Service: Neurosurgery;  Laterality: Right;  Right Craniotomy with brain lab for tumor    FAMILY HISTORY: family history includes Cancer in his sister; Diabetes in his brother; Heart disease in an other family member.  SOCIAL HISTORY:  reports that he has never smoked. He has never used smokeless tobacco. He reports that he does not drink alcohol or use illicit drugs.  ALLERGIES: Review of patient's allergies indicates no known allergies.  MEDICATIONS:  No current facility-administered medications for this encounter.   Current Outpatient Prescriptions  Medication Sig Dispense Refill  . dexamethasone (DECADRON) 2 MG tablet Take 1 tablet (2 mg total) by mouth every 12 (twelve) hours.  60 tablet  0  . docusate sodium 100 MG CAPS Take 100 mg by mouth 2 (two) times daily.  60 capsule  0  . levETIRAcetam (KEPPRA) 500 MG tablet Take 1 tablet (500 mg total) by mouth 2 (two) times daily.  60 tablet  1   Facility-Administered Medications Ordered in Other Encounters  Medication Dose Route Frequency Provider Last Rate Last Dose  . 0.9 % NaCl with KCl 20 mEq/ L  infusion   Intravenous Continuous Newman Pies, MD 50 mL/hr at 08/25/14 2221    . acetaminophen (TYLENOL) tablet 650 mg  650 mg Oral Q4H PRN Newman Pies, MD   650 mg at 08/27/14 0102   Or  . acetaminophen (TYLENOL) suppository 650 mg  650 mg Rectal Q4H PRN Newman Pies, MD      . atorvastatin  (LIPITOR) tablet 40 mg  40 mg Oral q1800 Newman Pies, MD   40 mg at 08/26/14 1751  . dexamethasone (DECADRON) tablet 2 mg  2 mg Oral Q12H Newman Pies, MD   2 mg at 08/26/14 2134  . docusate sodium (COLACE) capsule 100 mg  100 mg Oral BID Newman Pies, MD   100 mg at 08/26/14 2134  . labetalol (NORMODYNE,TRANDATE) injection 10-40 mg  10-40 mg Intravenous Q10 min PRN Newman Pies, MD   20 mg at 08/21/14 0908  . levETIRAcetam (KEPPRA) tablet 500 mg  500 mg Oral BID Newman Pies, MD   500 mg at 08/26/14 2134  . metoprolol tartrate (LOPRESSOR) tablet 25 mg  25 mg Oral BID Newman Pies, MD   25 mg at 08/26/14 2134  . niacin (NIASPAN) CR tablet 1,000 mg  1,000 mg Oral QHS Newman Pies, MD   1,000 mg at 08/26/14 2134  . nitroGLYCERIN (NITROSTAT) SL tablet 0.4 mg  0.4 mg Sublingual Q5 min PRN Newman Pies, MD      . ondansetron [facility]) tablet 4 mg  4 mg Oral Q4H PRN Newman Pies, MD       Or  . ondansetron [facility]) injection 4 mg  4 mg Intravenous Q4H PRN Newman Pies, MD      . pantoprazole (PROTONIX) EC tablet 40 mg  40 mg Oral Daily Newman Pies, MD   40 mg at 08/26/14 0950  . polyethylene glycol (MIRALAX / GLYCOLAX) packet 17 g  17 g Oral BID Newman Pies, MD   17 g at 08/26/14 1156    REVIEW OF SYSTEMS:  A 15 point review of systems is documented in the electronic medical record. This was obtained by the nursing staff. However, I reviewed this with the patient to discuss relevant findings and make appropriate changes. Prior surgery there is no report of headaches or nausea. Family members had noticed  Confusion. Patient is not permitted to ambulate by himself at this time in light of confusion. He denies any visual problems.   PHYSICAL EXAM:  Vitals - 1 value per visit 0/93/2671  SYSTOLIC 245  DIASTOLIC 70  Pulse 59  Temperature 98.1  Respirations 20  Weight (lb)   Height   BMI   VISIT REPORT   This is a very pleasant 79 year old gentleman lying in his  hospital bed. He exhibits some confusion. He is accompanied by his wife son and daughter-in-law on evaluation this evening. Examination of the scalp reveals scar in the right temporal area with staples in place. No signs of drainage or infection. The pupils are equal round reactive to light. The extraocular eye movements are intact. The tongue is midline. No secondary infection noted the oral cavity or posterior pharynx. Examination of the neck and supraclavicular region reveals no evidence of adenopathy. The lungs are clear to auscultation for some mild bi-basilar crackles. The heart has regular rhythm and rate. On neurological examination motor strength is 5 out of 5 in the proximal and distal muscle groups in the upper lower extremities.    ECOG = 3  LABORATORY DATA:  Lab Results  Component Value  Date   WBC 10.4 08/20/2014   HGB 13.9 08/20/2014   HCT 39.8 08/20/2014   MCV 85.0 08/20/2014   PLT 259 08/20/2014   Lab Results  Component Value Date   NA 137 08/20/2014   K 3.9 08/20/2014   CL 100 08/20/2014   CO2 21 08/20/2014   GLUCOSE 202* 08/20/2014   CREATININE 0.91 08/20/2014   CALCIUM 8.3* 08/20/2014      RADIOGRAPHY: Dg Chest 2 View  08/20/2014   CLINICAL DATA:  Preoperative evaluation for brain tumor resection.  EXAM: CHEST  2 VIEW  COMPARISON:  None.  FINDINGS: Sternotomy wires are intact. Lungs are adequately inflated with minimal linear density in the left base likely scarring. There is blunting of the left costophrenic angle likely pleural parenchymal scarring. There is subtle focal density along the posterior left pleural surface on the lateral film. Cardiomediastinal silhouette is within normal. There are minimal degenerative changes of the spine.  IMPRESSION: No acute cardiopulmonary disease.  Linear density left base likely atelectasis. Subtle focal density along the pleural surface of the posterior left lung base on the lateral film likely atelectasis or pleural-parenchymal scarring,  however cannot exclude a parenchymal or pleural based mass. Consider noncontrast chest CT for further evaluation.   Electronically Signed   By: Marin Olp M.D.   On: 08/20/2014 14:10   Mr Jeri Cos XA Contrast  08/21/2014   CLINICAL DATA:  79 year old male post intracranial tumor resection. Subsequent encounter.  EXAM: MRI HEAD WITHOUT AND WITH CONTRAST  TECHNIQUE: Multiplanar, multiecho pulse sequences of the brain and surrounding structures were obtained without and with intravenous contrast.  CONTRAST:  49mL MULTIHANCE GADOBENATE DIMEGLUMINE 529 MG/ML IV SOLN  COMPARISON:  08/19/2014.  FINDINGS: Post right frontal craniotomy for resection of large right temporal lobe mass. Moderate pneumocephalus greater on the right with displacement of the frontal lobes posteriorly and slightly to the left.  In the operative cavity, fluid and small amount hemorrhage is noted.  Enhancement along the periphery of the operative site most notable superior margin may be related to residual tumor.  Prominent surrounding T2/FLAIR altered signal intensity extends into remainder of the temporal lobe, basal ganglia and right periatrial region. This may represent vasogenic edema which will resolve however, depending on the type tumor, this appearance may also represent combination of tumor cells and edema. The present examination will represent the patient's first postoperative baseline exam to which follow-up exam can be compared to determine if this represents residual tumor.  Mass effect upon the left lateral ventricle. 1 cm midline shift to the left. Mild prominence left lateral ventricle unchanged.  Small infarct along the superior posterior margin of the resection site.  IMPRESSION: Post right frontal craniotomy for resection of large right temporal lobe mass. Moderate pneumocephalus greater on the right with displacement of the frontal lobes posteriorly and slightly to the left.  Enhancement along the periphery of the operative  site most notable superior margin may be related to residual tumor.  Prominent surrounding T2/FLAIR altered signal intensity extends into remainder of the temporal lobe, basal ganglia and right periatrial region. This may represent vasogenic edema which will resolve however, depending on the type tumor, this appearance may also represent combination of tumor cells and edema. The present examination will represent the patient's first postoperative baseline exam to which follow-up exam can be compared to determine if this represents residual tumor.  Mass effect upon the left lateral ventricle. 1 cm midline shift to the left. Mild prominence left  lateral ventricle unchanged.  Small infarct along the superior posterior margin of the resection site.  These results will be called to the ordering clinician or representative by the Radiologist Assistant, and communication documented in the PACS or zVision Dashboard.   Electronically Signed   By: Chauncey Cruel M.D.   On: 08/21/2014 15:23   Mr Jeri Cos YO Contrast  08/19/2014   CLINICAL DATA:  Brain tumor.  EXAM: MRI HEAD WITHOUT AND WITH CONTRAST  TECHNIQUE: Multiplanar, multiecho pulse sequences of the brain and surrounding structures were obtained without and with intravenous contrast.  CONTRAST:  20 mL MultiHance  COMPARISON:  08/14/2014 brain MRI from [facility]  FINDINGS: There is no acute infarct. T2 hyperintense mass in the right temporal lobe measures approximately 5.4 x 4.4 x 4.6 cm, not significantly unchanged in size from recent outside MRI. The mass demonstrates irregular, predominantly peripheral enhancement with evidence of central necrosis. A small amount of susceptibility artifact centrally within the lesion may reflect old blood products. The mass partially compresses and medially displaces the temporal horn of the right lateral ventricle. Moderate surrounding edema does not appear significantly changed and partially extends into the right  parietal white matter as well as the posterior internal and external capsules. 6 mm of leftward midline shift has not significantly changed. There is mild mass effect on the right midbrain.  No other enhancing lesions are identified. Scattered, small foci of T2 hyperintensity in the cerebral white matter elsewhere are nonspecific but compatible with mild chronic small vessel ischemic disease. There is mild to moderate cerebral atrophy. There is no extra-axial fluid collection.  Orbits are unremarkable. Tiny left maxillary sinus mucous retention cyst is noted. Mastoid air cells are clear. Major intracranial vascular flow voids are preserved.  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   Electronically Signed   By: Logan Bores   On: 08/19/2014 14:24   Dg Chest Port 1 View  08/20/2014   CLINICAL DATA:  Status post central line placement.  EXAM: PORTABLE CHEST - 1 VIEW  COMPARISON:  08/20/2014  FINDINGS: Patient has had median sternotomy and CABG. A left subclavian central venous line has been placed, tip overlying the level of the left brachycephalic -SVC confluence. No pneumothorax. There is left basilar opacity consistent with atelectasis or developing infiltrate. There is mild pulmonary vascular congestion. No overt edema.  IMPRESSION: 1. Interval placement of left-sided central line, tip overlying left brachycephalic -SVC confluence. 2. Developing left lower lobe infiltrate/atelectasis. 3. Pulmonary vascular congestion.   Electronically Signed   By: Shon Hale M.D.   On: 08/20/2014 18:42      IMPRESSION: Glioblastoma presenting in the right temporal brain. Patient has had  a good gross total resection of tumor. Postoperative MRI suggests possible residual disease along the superior operative bed. Patient would be a good candidate for postoperative radiation therapy directed at the right  temporal brain area. He would also likely benefit from concomitant Temodar. I discussed the treatment course side effects and potential toxicities of radiation therapy in this situation with the patient and his family. Patient and his family appeared to understand and wish that he proceed with radiation treatment.  PLAN: Patient will be seen  next week for additional evaluation and planning for his radiation therapy. Anticipate radiation therapy starting approximately 3 weeks postop. I anticipate approximately 6 weeks of radiation therapy unless the overall performance does  not improved significantly warranting a more abbreviated course of treatment.     ------------------------------------------------  Blair Promise, PhD, MD

## 2014-08-27 NOTE — Progress Notes (Signed)
Called [facility], patient ready for DC to rehab.

## 2014-08-27 NOTE — Telephone Encounter (Signed)
S/W PATIENT WIFE AND GAVE HOSP F/U APPT 10/01 @ 11:45 W/DR. SHERRILL.

## 2014-08-27 NOTE — Telephone Encounter (Signed)
S/W PATIENT WIFE AND GAVE HOSP F/U APPT FOR 10/01 @ 11:45 W/DR. SHERRILL

## 2014-08-27 NOTE — Progress Notes (Addendum)
There is not an inpt rehab bed available for this pt today. SW and RN CM are aware. [phone]

## 2014-08-27 NOTE — Progress Notes (Signed)
Patient ID: [name], male   DOB: 1934-12-26, 79 y.o.   MRN: [id number] Subjective:  The patient is alert and pleasant. He wants to go to rehabilitation. He looks well.  Objective: Vital signs in last 24 hours: Temp:  [97.7 F (36.5 C)-98.6 F (37 C)] 98.1 F (36.7 C) (09/23 0552) Pulse Rate:  [54-78] 59 (09/23 0552) Resp:  [18-20] 20 (09/23 0552) BP: (103-131)/(60-75) 120/70 mmHg (09/23 0552) SpO2:  [95 %-98 %] 95 % (09/23 0552)  Intake/Output from previous day: 09/22 0701 - 09/23 0700 In: 240 [P.O.:240] Out: 1830 [Urine:1830] Intake/Output this shift: Total I/O In: -  Out: 925 [Urine:925]  Physical exam patient is alert and oriented x3. He is moving all 4 extremities well. His wound is healing well.  Lab Results: No results found for this basename: WBC, HGB, HCT, PLT,  in the last 72 hours BMET No results found for this basename: NA, K, CL, CO2, GLUCOSE, BUN, CREATININE, CALCIUM,  in the last 72 hours  Studies/Results: No results found.  Assessment/Plan: Postop day #7: The patient is doing well except for a somewhat slow to mobilize. We are awaiting skilled nursing facility placement.  LOS: 7 days     Edin Kon D 08/27/2014, 8:49 AM

## 2014-08-27 NOTE — Progress Notes (Signed)
Report was called to SNF and we awaiting transportation.

## 2014-08-27 NOTE — Telephone Encounter (Signed)
Per Order from Dr. Benay Spice, spoke with J. Epps to please perform MGMT Methylation and IDH1 and IDH2 mutation testing on Accession: [id number].

## 2014-09-01 ENCOUNTER — Encounter (HOSPITAL_COMMUNITY): Payer: Medicare Other

## 2014-09-01 ENCOUNTER — Encounter: Payer: Medicare Other | Admitting: Surgery

## 2014-09-01 NOTE — Progress Notes (Signed)
Location/Histology of Brain Tumor: Glioblastoma presenting in the right temporal brain  Patient presented with symptoms of:  Confusion and lost his appetite.  Past or anticipated interventions, if any, per neurosurgery: 08/20/14 - Procedure: CRANIOTOMY TUMOR EXCISION w/ BrainLab;  Surgeon: Newman Pies, MD;  Location: [facility];  Service: Neurosurgery;  Laterality: Right;  Right Craniotomy with brain lab for tumor  Past or anticipated interventions, if any, per medical oncology: Temozolomide.  Has follow up scheduled with Dr. Benay Spice on 09/04/14.  Dose of Decadron, if applicable: 2 mg three times a day  Recent neurologic symptoms, if any:   Seizures: no  Headaches: no  Nausea: no  Dizziness/ataxia: no  Difficulty with hand coordination: no  Focal numbness/weakness: no  Visual deficits/changes: sees floaters  Confusion/Memory deficits: some confusion, some memory issues  Painful bone metastases at present, if any: no  SAFETY ISSUES:  Prior radiation? no  Pacemaker/ICD? no  Possible current pregnancy? no  Is the patient on methotrexate? no  Additional Complaints / other details: Currently lives at [facility].  Patient is here with his wife and son.  He is using a walker.

## 2014-09-02 ENCOUNTER — Encounter: Payer: Self-pay | Admitting: Radiation Oncology

## 2014-09-02 ENCOUNTER — Ambulatory Visit
Admission: RE | Admit: 2014-09-02 | Discharge: 2014-09-02 | Disposition: A | Discharge status: Home / Self Care | Payer: Medicare Other | Source: Ambulatory Visit | Attending: Radiation Oncology | Admitting: Radiation Oncology

## 2014-09-02 VITALS — BP 113/63 | HR 71 | Temp 97.8°F | Resp 20 | Ht 74.0 in | Wt 255.4 lb

## 2014-09-02 DIAGNOSIS — Z7982 Long term (current) use of aspirin: Secondary | ICD-10-CM | POA: Insufficient documentation

## 2014-09-02 DIAGNOSIS — Z510 Encounter for antineoplastic radiation therapy: Secondary | ICD-10-CM | POA: Diagnosis present

## 2014-09-02 DIAGNOSIS — C719 Malignant neoplasm of brain, unspecified: Secondary | ICD-10-CM | POA: Insufficient documentation

## 2014-09-02 DIAGNOSIS — D496 Neoplasm of unspecified behavior of brain: Secondary | ICD-10-CM

## 2014-09-02 NOTE — Progress Notes (Signed)
Please see the Nurse Progress Note in the MD Initial Consult Encounter for this patient. 

## 2014-09-02 NOTE — Progress Notes (Signed)
Radiation Oncology         [phone] ________________________________  Name: [name] MRN: [id number]  Date: 09/02/2014  DOB: December 30, 1934  Follow-Up Visit Note  CC: Jerlyn Ly, MD  Newman Pies, MD  Diagnosis: Glioblastoma presenting in the right temporal brain   Narrative:  The patient returns today for for further evaluation. The patient was initially seen as an inpatient over at [facility]. Since that initial evaluation the patient has been discharged and is residing in the [facility]. According to patient and his family he is making good progress and should be ready for discharge home in approximately one week. The patient denies any headaches or nausea. He is ambulating with the assistance of a walker at this time.                         ALLERGIES:  has No Known Allergies.  Meds: Current Outpatient Prescriptions  Medication Sig Dispense Refill  . Cholecalciferol (VITAMIN D-3 PO) Take 1,000 Units by mouth daily.       Marland Kitchen dexamethasone (DECADRON) 2 MG tablet Take 1 tablet (2 mg total) by mouth every 12 (twelve) hours.  60 tablet  0  . docusate sodium 100 MG CAPS Take 100 mg by mouth 2 (two) times daily.  60 capsule  0  . levETIRAcetam (KEPPRA) 500 MG tablet Take 1 tablet (500 mg total) by mouth 2 (two) times daily.  60 tablet  1  . metoprolol (LOPRESSOR) 50 MG tablet Take 0.5 tablets (25 mg total) by mouth 2 (two) times daily.  30 tablet  6  . niacin (NIASPAN) 1000 MG CR tablet Take 1,000 mg by mouth at bedtime.        . nitroGLYCERIN (NITROSTAT) 0.4 MG SL tablet Place 1 tablet (0.4 mg total) under the tongue every 5 (five) minutes as needed.  25 tablet  6  . simvastatin (ZOCOR) 80 MG tablet Take 40 mg by mouth at bedtime.       Marland Kitchen Ubiquinol 50 MG CAPS Take 1 capsule by mouth every 30 (thirty) days. No specific day.      Marland Kitchen aspirin 325 MG tablet Take 325 mg by mouth daily.      Marland Kitchen KRILL OIL PO Take 1 capsule by mouth every 30  (thirty) days. No specific day.      . Multiple Vitamins-Minerals (MEGA MULTI MEN PO) Take by mouth.      . Probiotic Product (PROBIOTIC COMPLEX ACIDOPHILUS) CAPS Take by mouth.       No current facility-administered medications for this encounter.    Physical Findings: The patient is in no acute distress. Patient is alert and oriented.  height is 6\' 2"  (1.88 m) and weight is 255 lb 6.4 oz (115.849 kg). His oral temperature is 97.8 F (36.6 C). His blood pressure is 113/63 and his pulse is 71. His respiration is 20 and oxygen saturation is 98%. .  The patient is much more focused since my last evaluation on September 22. He responds appropriately to most all questions.  The patient's right temple scars healing well without signs of drainage or infection. The patient's staples have been removed.  Lab Findings: Lab Results  Component Value Date   WBC 10.4 08/20/2014   HGB 13.9 08/20/2014   HCT 39.8 08/20/2014   MCV 85.0 08/20/2014   PLT 259 08/20/2014      Radiographic Findings: Dg Chest 2 View  Mr Brain  W Wo Contrast  08/21/2014   CLINICAL DATA:  79 year old male post intracranial tumor resection. Subsequent encounter.  EXAM: MRI HEAD WITHOUT AND WITH CONTRAST  TECHNIQUE: Multiplanar, multiecho pulse sequences of the brain and surrounding structures were obtained without and with intravenous contrast.  CONTRAST:  31mL MULTIHANCE GADOBENATE DIMEGLUMINE 529 MG/ML IV SOLN  COMPARISON:  08/19/2014.  FINDINGS: Post right frontal craniotomy for resection of large right temporal lobe mass. Moderate pneumocephalus greater on the right with displacement of the frontal lobes posteriorly and slightly to the left.  In the operative cavity, fluid and small amount hemorrhage is noted.  Enhancement along the periphery of the operative site most notable superior margin may be related to residual tumor.  Prominent surrounding T2/FLAIR altered signal intensity extends into remainder of the temporal lobe, basal  ganglia and right periatrial region. This may represent vasogenic edema which will resolve however, depending on the type tumor, this appearance may also represent combination of tumor cells and edema. The present examination will represent the patient's first postoperative baseline exam to which follow-up exam can be compared to determine if this represents residual tumor.  Mass effect upon the left lateral ventricle. 1 cm midline shift to the left. Mild prominence left lateral ventricle unchanged.  Small infarct along the superior posterior margin of the resection site.  IMPRESSION: Post right frontal craniotomy for resection of large right temporal lobe mass. Moderate pneumocephalus greater on the right with displacement of the frontal lobes posteriorly and slightly to the left.  Enhancement along the periphery of the operative site most notable superior margin may be related to residual tumor.  Prominent surrounding T2/FLAIR altered signal intensity extends into remainder of the temporal lobe, basal ganglia and right periatrial region. This may represent vasogenic edema which will resolve however, depending on the type tumor, this appearance may also represent combination of tumor cells and edema. The present examination will represent the patient's first postoperative baseline exam to which follow-up exam can be compared to determine if this represents residual tumor.  Mass effect upon the left lateral ventricle. 1 cm midline shift to the left. Mild prominence left lateral ventricle unchanged.  Small infarct along the superior posterior margin of the resection site.  These results will be called to the ordering clinician or representative by the Radiologist Assistant, and communication documented in the PACS or zVision Dashboard.   Electronically Signed   By: Chauncey Cruel M.D.   On: 08/21/2014 15:23   Impression: Glioblastoma presenting in the right temporal brain. Patient has had a good gross total resection  of tumor. Postoperative MRI suggests possible residual disease along the superior operative bed. Patient would be a good candidate for postoperative radiation therapy directed at the right temporal brain area. He has improved significantly since my last evaluation and would be a candidate for full dose chemotherapy along with radiosensitizing Temodar.    Plan:  Simulation and planning later today with treatments to begin approximately one week from now. Anticipate a postoperative dose of 60 gray  ____________________________________ Blair Promise, MD

## 2014-09-03 ENCOUNTER — Other Ambulatory Visit: Payer: Self-pay | Admitting: Radiation Oncology

## 2014-09-03 ENCOUNTER — Ambulatory Visit: Payer: Self-pay | Admitting: Radiation Oncology

## 2014-09-04 ENCOUNTER — Encounter: Payer: Self-pay | Admitting: *Deleted

## 2014-09-04 ENCOUNTER — Ambulatory Visit: Payer: Medicare Other

## 2014-09-04 ENCOUNTER — Telehealth: Payer: Self-pay | Admitting: Oncology

## 2014-09-04 ENCOUNTER — Other Ambulatory Visit: Payer: Self-pay | Admitting: *Deleted

## 2014-09-04 ENCOUNTER — Encounter: Payer: Self-pay | Admitting: Oncology

## 2014-09-04 ENCOUNTER — Ambulatory Visit (HOSPITAL_BASED_OUTPATIENT_CLINIC_OR_DEPARTMENT_OTHER): Payer: Medicare Other | Admitting: Oncology

## 2014-09-04 VITALS — BP 118/70 | HR 60 | Temp 98.2°F | Resp 19 | Ht 74.0 in | Wt 255.1 lb

## 2014-09-04 DIAGNOSIS — D496 Neoplasm of unspecified behavior of brain: Secondary | ICD-10-CM

## 2014-09-04 DIAGNOSIS — C712 Malignant neoplasm of temporal lobe: Secondary | ICD-10-CM

## 2014-09-04 DIAGNOSIS — R4182 Altered mental status, unspecified: Secondary | ICD-10-CM

## 2014-09-04 MED ORDER — SULFAMETHOXAZOLE-TMP DS 800-160 MG PO TABS: 1.0000 | ORAL_TABLET | ORAL | Status: DC

## 2014-09-04 MED ORDER — ONDANSETRON HCL 8 MG PO TABS: 8.0000 mg | ORAL_TABLET | ORAL | Status: DC

## 2014-09-04 NOTE — Progress Notes (Signed)
  [facility] OFFICE PROGRESS NOTE   Diagnosis: Glioblastoma  INTERVAL HISTORY:   Mr. Baumgart is recovering in a rehabilitation facility. His family reports his confusion is partially improved. He is ambulating. No new neurologic symptoms. He saw Dr. Sondra Come and is scheduled to begin brain radiation 09/15/2014.  Objective:  Vital signs in last 24 hours:  Blood pressure 118/70, pulse 60, temperature 98.2 F (36.8 C), temperature source Oral, resp. rate 19, height 6\' 2"  (1.88 m), weight 255 lb 1.6 oz (115.713 kg), SpO2 98.00%.    HEENT: No thrush, healed scalp incision Resp: Lungs clear bilaterally Cardio: Regular rate and rhythm GI: No hepatosplenomegaly Vascular: Bilateral support stockings in place at the lower legs, no edema Neuro: Alert, follows commands, the motor exam appears intact in the upper and lower extremities     Lab Results:  Lab Results  Component Value Date   WBC 10.4 08/20/2014   HGB 13.9 08/20/2014   HCT 39.8 08/20/2014   MCV 85.0 08/20/2014   PLT 259 08/20/2014    Medications: I have reviewed the patient's current medications.  Assessment/Plan: 1. Glioblastoma Multiforme, status post resection of a right temporal mass 08/20/2014  2. altered mental status secondary to #1  3. history of coronary artery disease  4. 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 sleep apnea    Disposition:  He has recovered from surgery. The plan is to begin concurrent temozolomide and radiation 09/15/2014. I discussed the rationale behind this treatment with Mr. Zarazua and his family. He agrees to proceed. He will attend a chemotherapy teaching class.  We discussed the potential toxicities associated with temozolomide including the chance for nausea, mucositis, diarrhea, rash, hepatitis, and hematologic toxicity. We also discussed the atypical infections seen in patients treated with temozolomide and radiation. He will be placed on  prophylactic Bactrim.  The plan is to complete concurrent temozolomide and radiation followed by a restaging brain MRI and then adjuvant temozolomide.  Mr. Eyer will return for an office visit 09/26/2014.  Betsy Coder, MD  09/04/2014  1:51 PM

## 2014-09-04 NOTE — Telephone Encounter (Signed)
gv adn printed appt sched and avs for pt for OCT °

## 2014-09-04 NOTE — Progress Notes (Signed)
Called to pathology department to run further testing on case [id number]: MD requests MGMT,IDH1,IDH2 testing (spoke with Vata).

## 2014-09-04 NOTE — Progress Notes (Unsigned)
Spoke with nurse, DeeDee at [facility]. She is sure that if our office sends the scripts to them for Temodar, Zofran and Bactrim DS they will be able to have their pharmacy fill these. She will call office back if there is an issue with this. Faxed scripts to facility [phone] with confirmation received. Also sent copy of today's office note.

## 2014-09-04 NOTE — Patient Instructions (Signed)
Please provide copy of your Advanced Directive/Living Will to be scanned into your record.   Fall Prevention and Home Safety Falls cause injuries and can affect all age groups. It is possible to use preventive measures to significantly decrease the likelihood of falls. There are many simple measures which can make your home safer and prevent falls. OUTDOORS  Repair cracks and edges of walkways and driveways.  Remove high doorway thresholds.  Trim shrubbery on the main path into your home.  Have good outside lighting.  Clear walkways of tools, rocks, debris, and clutter.  Check that handrails are not broken and are securely fastened. Both sides of steps should have handrails.  Have leaves, snow, and ice cleared regularly.  Use sand or salt on walkways during winter months.  In the garage, clean up grease or oil spills. BATHROOM  Install night lights.  Install grab bars by the toilet and in the tub and shower.  Use non-skid mats or decals in the tub or shower.  Place a plastic non-slip stool in the shower to sit on, if needed.  Keep floors dry and clean up all water on the floor immediately.  Remove soap buildup in the tub or shower on a regular basis.  Secure bath mats with non-slip, double-sided rug tape.  Remove throw rugs and tripping hazards from the floors. BEDROOMS  Install night lights.  Make sure a bedside light is easy to reach.  Do not use oversized bedding.  Keep a telephone by your bedside.  Have a firm chair with side arms to use for getting dressed.  Remove throw rugs and tripping hazards from the floor. KITCHEN  Keep handles on pots and pans turned toward the center of the stove. Use back burners when possible.  Clean up spills quickly and allow time for drying.  Avoid walking on wet floors.  Avoid hot utensils and knives.  Position shelves so they are not too high or low.  Place commonly used objects within easy reach.  If necessary,  use a sturdy step stool with a grab bar when reaching.  Keep electrical cables out of the way.  Do not use floor polish or wax that makes floors slippery. If you must use wax, use non-skid floor wax.  Remove throw rugs and tripping hazards from the floor. STAIRWAYS  Never leave objects on stairs.  Place handrails on both sides of stairways and use them. Fix any loose handrails. Make sure handrails on both sides of the stairways are as long as the stairs.  Check carpeting to make sure it is firmly attached along stairs. Make repairs to worn or loose carpet promptly.  Avoid placing throw rugs at the top or bottom of stairways, or properly secure the rug with carpet tape to prevent slippage. Get rid of throw rugs, if possible.  Have an electrician put in a light switch at the top and bottom of the stairs. OTHER FALL PREVENTION TIPS  Wear low-heel or rubber-soled shoes that are supportive and fit well. Wear closed toe shoes.  When using a stepladder, make sure it is fully opened and both spreaders are firmly locked. Do not climb a closed stepladder.  Add color or contrast paint or tape to grab bars and handrails in your home. Place contrasting color strips on first and last steps.  Learn and use mobility aids as needed. Install an electrical emergency response system.  Turn on lights to avoid dark areas. Replace light bulbs that burn out immediately. Get light  switches that glow.  Arrange furniture to create clear pathways. Keep furniture in the same place.  Firmly attach carpet with non-skid or double-sided tape.  Eliminate uneven floor surfaces.  Select a carpet pattern that does not visually hide the edge of steps.  Be aware of all pets. OTHER HOME SAFETY TIPS  Set the water temperature for 120 F (48.8 C).  Keep emergency numbers on or near the telephone.  Keep smoke detectors on every level of the home and near sleeping areas. Document Released: 11/11/2002 Document  Revised: 05/22/2012 Document Reviewed: 02/10/2012 [facility] Patient Information 2015 [place], [place]. This information is not intended to replace advice given to you by your health care provider. Make sure you discuss any questions you have with your health care provider.

## 2014-09-04 NOTE — Progress Notes (Signed)
Checked in new patient with no finanacial issues prior to seeing the dr. He has prim/secnd insurance.

## 2014-09-06 NOTE — Progress Notes (Signed)
  Radiation Oncology         [phone]) [phone] ________________________________  Name: [name] MRN: [id number]  Date: 09/02/2014  DOB: 1935/10/24  SIMULATION AND TREATMENT PLANNING NOTE  DIAGNOSIS: Glioblastoma presenting in the right temporal brain   NARRATIVE:  The patient was brought to the [facility].  Identity was confirmed.  All relevant records and images related to the planned course of therapy were reviewed.  The patient freely provided informed written consent to proceed with treatment after reviewing the details related to the planned course of therapy. The consent form was witnessed and verified by the simulation staff.  Then, the patient was set-up in a stable reproducible  supine position for radiation therapy.  CT images were obtained.  Surface markings were placed.  The CT images were loaded into the planning software.  Then the target and avoidance structures were contoured.  Treatment planning then occurred.  The radiation prescription was entered and confirmed.  Then, I designed and supervised the construction of a total of 1 medically necessary complex treatment devices.  I have requested : Intensity Modulated Radiotherapy (IMRT) is medically necessary for this case for the following reason:  Critical CNS structure avoidance - brainstem, optic chiasm, optic nerve..  I have ordered:dose calc.  PLAN:  The patient will receive 60 Gy in 30 fractions.  ________________________________    Special treatment procedure  The patient will be receiving radiosensitizing chemotherapy during the course of his treatment. Given the increased potential for toxicities as well as the necessity for close monitoring of the patient and bloodwork, this constitutes a special treatment procedure -----------------------------------  Blair Promise, PhD, MD

## 2014-09-08 ENCOUNTER — Encounter (HOSPITAL_COMMUNITY): Payer: Self-pay

## 2014-09-09 ENCOUNTER — Encounter (HOSPITAL_COMMUNITY): Payer: Self-pay

## 2014-09-09 DIAGNOSIS — Z510 Encounter for antineoplastic radiation therapy: Secondary | ICD-10-CM | POA: Diagnosis present

## 2014-09-09 DIAGNOSIS — C719 Malignant neoplasm of brain, unspecified: Secondary | ICD-10-CM | POA: Insufficient documentation

## 2014-09-10 ENCOUNTER — Telehealth: Payer: Self-pay | Admitting: *Deleted

## 2014-09-10 NOTE — Telephone Encounter (Signed)
Pt was discharged from rehab facility today. Spoke with his wife who reports the facility called in Decadron and Keppra. She does not have Temodar, planned to ask about prescription when here for chemo class on 10/8. Temodar, Zofran and Bactrim prescriptions faxed to [facility].

## 2014-09-11 ENCOUNTER — Telehealth: Payer: Self-pay | Admitting: *Deleted

## 2014-09-11 ENCOUNTER — Other Ambulatory Visit: Payer: Medicare Other

## 2014-09-11 ENCOUNTER — Encounter: Payer: Self-pay | Admitting: *Deleted

## 2014-09-11 ENCOUNTER — Other Ambulatory Visit: Payer: Self-pay | Admitting: *Deleted

## 2014-09-11 ENCOUNTER — Ambulatory Visit
Admission: RE | Admit: 2014-09-11 | Discharge: 2014-09-11 | Disposition: A | Discharge status: Home / Self Care | Payer: Medicare Other | Source: Ambulatory Visit | Attending: Radiation Oncology | Admitting: Radiation Oncology

## 2014-09-11 DIAGNOSIS — C719 Malignant neoplasm of brain, unspecified: Secondary | ICD-10-CM

## 2014-09-11 DIAGNOSIS — Z510 Encounter for antineoplastic radiation therapy: Secondary | ICD-10-CM | POA: Diagnosis not present

## 2014-09-11 MED ORDER — TEMOZOLOMIDE 180 MG PO CAPS: 180.0000 mg | ORAL_CAPSULE | Freq: Every day | ORAL | Status: DC

## 2014-09-11 MED ORDER — SULFAMETHOXAZOLE-TMP DS 800-160 MG PO TABS: 1.0000 | ORAL_TABLET | ORAL | Status: DC

## 2014-09-11 MED ORDER — BIAFINE EX EMUL: Freq: Once | CUTANEOUS | Status: DC

## 2014-09-11 MED ORDER — ONDANSETRON HCL 8 MG PO TABS
8.0000 mg | ORAL_TABLET | ORAL | Status: DC
Start: 2014-09-11 — End: 2015-02-05

## 2014-09-11 NOTE — Telephone Encounter (Signed)
[facility] unable to service patient for Temodar.  Preferred pharmacy per the patient's insurance is [facility].

## 2014-09-11 NOTE — Progress Notes (Signed)
[name] and his wife were in nursing with questions about his chemotherapy pill - Temodar - where to pick it up and when to take Zofran.  Alberteen Sam, RN, Dr. Gearldine Shown nurse and she is going to check with [facility].  She advised that he take Zofran an hour before taking Temodar and to have him take it at bedtime.  Advised Dr. Sondra Come that patient has not taken Temodar yet and he said it is OK for patient to have treatment.  Nicki Reaper, Nurse Transporter escorted patient to the treatment area.  Also gave them the Radiation Therapy and You book with the appropriate pages marked.  Also gave him biafine cream and instructed him to apply it to his right forehead/scalp twice a day after treatment and at bedtime.  Advised patient I would meet with him tomorrow to go over the information in the Radiation Therapy book.

## 2014-09-12 ENCOUNTER — Ambulatory Visit
Admission: RE | Admit: 2014-09-12 | Discharge: 2014-09-12 | Disposition: A | Discharge status: Home / Self Care | Payer: Medicare Other | Source: Ambulatory Visit | Attending: Radiation Oncology | Admitting: Radiation Oncology

## 2014-09-12 ENCOUNTER — Telehealth: Payer: Self-pay | Admitting: Oncology

## 2014-09-12 DIAGNOSIS — Z510 Encounter for antineoplastic radiation therapy: Secondary | ICD-10-CM | POA: Diagnosis not present

## 2014-09-12 NOTE — Progress Notes (Signed)
Reviewed potential side effects/management with patient and his wife.  Discussed skin irritation, fatigue, headaches, eye irritation and nausea.  Also discussed under treat day with Dr. Sondra Come on Tuesday's.  Patient stated he understood and did not have any questions.

## 2014-09-12 NOTE — Telephone Encounter (Signed)
[name] called and said she had a message from [facility] saying they mailed [name]'s Temodar yesterday.  She said it would be 5 business days before they received it.

## 2014-09-15 ENCOUNTER — Ambulatory Visit
Admission: RE | Admit: 2014-09-15 | Discharge: 2014-09-15 | Disposition: A | Discharge status: Home / Self Care | Payer: Medicare Other | Source: Ambulatory Visit | Attending: Radiation Oncology | Admitting: Radiation Oncology

## 2014-09-15 DIAGNOSIS — Z510 Encounter for antineoplastic radiation therapy: Secondary | ICD-10-CM | POA: Diagnosis not present

## 2014-09-15 NOTE — Telephone Encounter (Signed)
Opened in error

## 2014-09-16 ENCOUNTER — Ambulatory Visit
Admission: RE | Admit: 2014-09-16 | Discharge: 2014-09-16 | Disposition: A | Discharge status: Home / Self Care | Payer: Medicare Other | Source: Ambulatory Visit | Attending: Radiation Oncology | Admitting: Radiation Oncology

## 2014-09-16 ENCOUNTER — Ambulatory Visit
Admission: RE | Admit: 2014-09-16 | Discharge: 2014-09-16 | Disposition: A | Discharge status: Home / Self Care | Payer: BC Managed Care – PPO | Source: Ambulatory Visit | Attending: Radiation Oncology | Admitting: Radiation Oncology

## 2014-09-16 ENCOUNTER — Encounter: Payer: Self-pay | Admitting: Radiation Oncology

## 2014-09-16 VITALS — BP 115/68 | HR 43 | Temp 97.5°F | Resp 16 | Ht 74.0 in | Wt 255.5 lb

## 2014-09-16 DIAGNOSIS — C719 Malignant neoplasm of brain, unspecified: Secondary | ICD-10-CM

## 2014-09-16 DIAGNOSIS — Z510 Encounter for antineoplastic radiation therapy: Secondary | ICD-10-CM | POA: Diagnosis not present

## 2014-09-16 NOTE — Progress Notes (Signed)
  Radiation Oncology         [phone] ________________________________  Name: NORVEL WENKER MRN: [id number]  Date: 09/16/2014  DOB: 1935/06/16  Weekly Radiation Therapy Management  DIAGNOSIS: Glioblastoma presenting in the right temporal brain   Current Dose: 8 Gy     Planned Dose:  60 Gy  Narrative . . . . . . . . The patient presents for routine under treatment assessment.                                   The patient is without complaint. He is back home at this time. He is ambulating well with the assistance of a walker                                 Set-up films were reviewed.                                 The chart was checked. Physical Findings. . .  height is 6\' 2"  (1.88 m) and weight is 255 lb 8 oz (115.894 kg). His oral temperature is 97.5 F (36.4 C). His blood pressure is 115/68 and his pulse is 43. His respiration is 16 and oxygen saturation is 99%. . Weight essentially stable. The patient is having less confusion on evaluation today. The lungs are clear. The heart has regular rhythm and rate. The abdomen is soft and nontender with normal bowel sounds Impression . . . . . . . The patient is tolerating radiation. Plan . . . . . . . . . . . . Continue treatment as planned. He will start Temodar today  ________________________________   Blair Promise, PhD, MD

## 2014-09-16 NOTE — Progress Notes (Signed)
[name] has completed 4 treatments to his right temporal area.  He denies pain.  He is using a walker today.  He is taking decadron 2mg  bid and Kepra.  He received the temodar today and will start taking tonight.   He denies vision changes, balance issues and nausea.  His skin is intact and he is using biafine cream.  He reports fatigue.  His heart rate is low today at 43 and 48 when taking manually.  He is taking metoprolol.

## 2014-09-17 ENCOUNTER — Encounter: Payer: Self-pay | Admitting: *Deleted

## 2014-09-17 ENCOUNTER — Ambulatory Visit
Admission: RE | Admit: 2014-09-17 | Discharge: 2014-09-17 | Disposition: A | Discharge status: Home / Self Care | Payer: Medicare Other | Source: Ambulatory Visit | Attending: Radiation Oncology | Admitting: Radiation Oncology

## 2014-09-17 DIAGNOSIS — Z510 Encounter for antineoplastic radiation therapy: Secondary | ICD-10-CM | POA: Diagnosis not present

## 2014-09-17 NOTE — Progress Notes (Signed)
Received fax from [facility] that Temozolomide was dispensed to patient on 09/15/14.

## 2014-09-18 ENCOUNTER — Ambulatory Visit
Admission: RE | Admit: 2014-09-18 | Discharge: 2014-09-18 | Disposition: A | Discharge status: Home / Self Care | Payer: Medicare Other | Source: Ambulatory Visit | Attending: Radiation Oncology | Admitting: Radiation Oncology

## 2014-09-18 DIAGNOSIS — Z510 Encounter for antineoplastic radiation therapy: Secondary | ICD-10-CM | POA: Diagnosis not present

## 2014-09-19 ENCOUNTER — Ambulatory Visit
Admission: RE | Admit: 2014-09-19 | Discharge: 2014-09-19 | Disposition: A | Discharge status: Home / Self Care | Payer: Medicare Other | Source: Ambulatory Visit | Attending: Radiation Oncology | Admitting: Radiation Oncology

## 2014-09-19 DIAGNOSIS — Z510 Encounter for antineoplastic radiation therapy: Secondary | ICD-10-CM | POA: Diagnosis not present

## 2014-09-22 ENCOUNTER — Ambulatory Visit
Admission: RE | Admit: 2014-09-22 | Discharge: 2014-09-22 | Disposition: A | Discharge status: Home / Self Care | Payer: Medicare Other | Source: Ambulatory Visit | Attending: Radiation Oncology | Admitting: Radiation Oncology

## 2014-09-22 DIAGNOSIS — Z510 Encounter for antineoplastic radiation therapy: Secondary | ICD-10-CM | POA: Diagnosis not present

## 2014-09-23 ENCOUNTER — Ambulatory Visit
Admission: RE | Admit: 2014-09-23 | Discharge: 2014-09-23 | Disposition: A | Discharge status: Home / Self Care | Payer: BC Managed Care – PPO | Source: Ambulatory Visit | Attending: Radiation Oncology | Admitting: Radiation Oncology

## 2014-09-23 ENCOUNTER — Encounter: Payer: Self-pay | Admitting: Radiation Oncology

## 2014-09-23 ENCOUNTER — Ambulatory Visit
Admission: RE | Admit: 2014-09-23 | Discharge: 2014-09-23 | Disposition: A | Discharge status: Home / Self Care | Payer: Medicare Other | Source: Ambulatory Visit | Attending: Radiation Oncology | Admitting: Radiation Oncology

## 2014-09-23 VITALS — BP 109/56 | HR 46 | Temp 97.6°F | Resp 20 | Ht 74.0 in | Wt 257.8 lb

## 2014-09-23 DIAGNOSIS — Z510 Encounter for antineoplastic radiation therapy: Secondary | ICD-10-CM | POA: Diagnosis not present

## 2014-09-23 DIAGNOSIS — C719 Malignant neoplasm of brain, unspecified: Secondary | ICD-10-CM

## 2014-09-23 NOTE — Progress Notes (Signed)
[name] has completed 9 fractions to his right temporal lobe.  He denies pain, dizziness and vision changes.  He is using a walker today and reports uses a cane at home.  He also reports that his hands are shaking.  He said his legs feel weak this morning.  He reports having a dry cough since being in the hospital.  He reports fatigue.  His wife reports that his memory and confusion is worse.  He is taking 2 mg of decadron per day.  He is also taking Temodar at night without any issues.  His heart rate is low today at 46 and 48 when taking manually.  His skin is intact and he is using biafine cream twice a day.

## 2014-09-23 NOTE — Progress Notes (Signed)
  Radiation Oncology         (336) [phone] ________________________________  Name: [name] MRN: [id number]  Date: 09/23/2014  DOB: 27-Jun-1935  Weekly Radiation Therapy Management  DIAGNOSIS: Glioblastoma presenting in the right temporal brain   Current Dose: 18 Gy     Planned Dose:  60 Gy  Narrative . . . . . . . . The patient presents for routine under treatment assessment.                                   The patient is without complaint.  He denies any headaches or nausea. He admits to some  confusion                                 Set-up films were reviewed.                                 The chart was checked. Physical Findings. . .  height is 6\' 2"  (1.88 m) and weight is 257 lb 12.8 oz (116.937 kg). His oral temperature is 97.6 F (36.4 C). His blood pressure is 109/56 and his pulse is 46. His respiration is 20 and oxygen saturation is 99%. . The lungs are clear. The heart has a regular rhythm and rate. The oral cavity is moist without secondary infection Impression . . . . . . . The patient is tolerating radiation. Plan . . . . . . . . . . . . Continue treatment as planned.  ________________________________   Blair Promise, PhD, MD

## 2014-09-24 ENCOUNTER — Ambulatory Visit
Admission: RE | Admit: 2014-09-24 | Discharge: 2014-09-24 | Disposition: A | Discharge status: Home / Self Care | Payer: Medicare Other | Source: Ambulatory Visit | Attending: Radiation Oncology | Admitting: Radiation Oncology

## 2014-09-24 ENCOUNTER — Encounter (HOSPITAL_COMMUNITY): Payer: Self-pay

## 2014-09-24 DIAGNOSIS — Z510 Encounter for antineoplastic radiation therapy: Secondary | ICD-10-CM | POA: Diagnosis not present

## 2014-09-25 ENCOUNTER — Ambulatory Visit
Admission: RE | Admit: 2014-09-25 | Discharge: 2014-09-25 | Disposition: A | Discharge status: Home / Self Care | Payer: Medicare Other | Source: Ambulatory Visit | Attending: Radiation Oncology | Admitting: Radiation Oncology

## 2014-09-25 DIAGNOSIS — Z510 Encounter for antineoplastic radiation therapy: Secondary | ICD-10-CM | POA: Diagnosis not present

## 2014-09-26 ENCOUNTER — Telehealth: Payer: Self-pay | Admitting: Oncology

## 2014-09-26 ENCOUNTER — Ambulatory Visit
Admission: RE | Admit: 2014-09-26 | Discharge: 2014-09-26 | Disposition: A | Discharge status: Home / Self Care | Payer: Medicare Other | Source: Ambulatory Visit | Attending: Radiation Oncology | Admitting: Radiation Oncology

## 2014-09-26 ENCOUNTER — Ambulatory Visit (HOSPITAL_BASED_OUTPATIENT_CLINIC_OR_DEPARTMENT_OTHER): Payer: Medicare Other | Admitting: Oncology

## 2014-09-26 ENCOUNTER — Other Ambulatory Visit (HOSPITAL_BASED_OUTPATIENT_CLINIC_OR_DEPARTMENT_OTHER): Payer: Medicare Other

## 2014-09-26 VITALS — BP 102/67 | HR 48 | Temp 97.7°F | Resp 18 | Ht 74.0 in | Wt 260.5 lb

## 2014-09-26 DIAGNOSIS — D496 Neoplasm of unspecified behavior of brain: Secondary | ICD-10-CM

## 2014-09-26 DIAGNOSIS — C712 Malignant neoplasm of temporal lobe: Secondary | ICD-10-CM

## 2014-09-26 DIAGNOSIS — C719 Malignant neoplasm of brain, unspecified: Secondary | ICD-10-CM

## 2014-09-26 DIAGNOSIS — Z510 Encounter for antineoplastic radiation therapy: Secondary | ICD-10-CM | POA: Diagnosis not present

## 2014-09-26 DIAGNOSIS — R4182 Altered mental status, unspecified: Secondary | ICD-10-CM

## 2014-09-26 LAB — COMPREHENSIVE METABOLIC PANEL (CC13)
ALT: 32 U/L (ref 0–55)
AST: 14 U/L (ref 5–34)
Albumin: 3 g/dL — ABNORMAL LOW (ref 3.5–5.0)
Alkaline Phosphatase: 59 U/L (ref 40–150)
Anion Gap: 7 mEq/L (ref 3–11)
BILIRUBIN TOTAL: 0.78 mg/dL (ref 0.20–1.20)
BUN: 23.8 mg/dL (ref 7.0–26.0)
CO2: 24 mEq/L (ref 22–29)
CREATININE: 1.1 mg/dL (ref 0.7–1.3)
Calcium: 8.6 mg/dL (ref 8.4–10.4)
Chloride: 108 mEq/L (ref 98–109)
Glucose: 92 mg/dl (ref 70–140)
Potassium: 4 mEq/L (ref 3.5–5.1)
Sodium: 139 mEq/L (ref 136–145)
Total Protein: 4.9 g/dL — ABNORMAL LOW (ref 6.4–8.3)

## 2014-09-26 LAB — CBC WITH DIFFERENTIAL/PLATELET
BASO%: 0.1 % (ref 0.0–2.0)
Basophils Absolute: 0 10*3/uL (ref 0.0–0.1)
EOS%: 0.1 % (ref 0.0–7.0)
Eosinophils Absolute: 0 10*3/uL (ref 0.0–0.5)
HEMATOCRIT: 41.2 % (ref 38.4–49.9)
HGB: 13.9 g/dL (ref 13.0–17.1)
LYMPH%: 5.8 % — AB (ref 14.0–49.0)
MCH: 29.9 pg (ref 27.2–33.4)
MCHC: 33.7 g/dL (ref 32.0–36.0)
MCV: 88.6 fL (ref 79.3–98.0)
MONO#: 1.1 10*3/uL — ABNORMAL HIGH (ref 0.1–0.9)
MONO%: 8.2 % (ref 0.0–14.0)
NEUT#: 11.5 10*3/uL — ABNORMAL HIGH (ref 1.5–6.5)
NEUT%: 85.8 % — AB (ref 39.0–75.0)
Platelets: 189 10*3/uL (ref 140–400)
RBC: 4.65 10*6/uL (ref 4.20–5.82)
RDW: 15.6 % — ABNORMAL HIGH (ref 11.0–14.6)
WBC: 13.4 10*3/uL — ABNORMAL HIGH (ref 4.0–10.3)
lymph#: 0.8 10*3/uL — ABNORMAL LOW (ref 0.9–3.3)
nRBC: 0 % (ref 0–0)

## 2014-09-26 LAB — TECHNOLOGIST REVIEW: Technologist Review: 2

## 2014-09-26 NOTE — Telephone Encounter (Signed)
Gave avs & cal for Oct & Nov.

## 2014-09-26 NOTE — Progress Notes (Signed)
  [facility] OFFICE PROGRESS NOTE   Diagnosis: Glioblastoma  INTERVAL HISTORY:   [name] returns as scheduled. He reports tolerating the temozolomide and radiation well. No nausea or diarrhea. He continues to have mild leg weakness.  Objective:  Vital signs in last 24 hours:  Blood pressure 102/67, pulse 48, temperature 97.7 F (36.5 C), temperature source Oral, resp. rate 18, height _0  (1.88 m), weight 260 lb 8 oz (118.162 kg).    HEENT: No thrush or ulcers Resp: Lungs clear bilaterally Cardio: Regular rate and rhythm GI: No hepatomegaly, nontender Vascular: Chronic stasis change at the lower legs bilaterally with trace edema at the right greater than left lower leg. No upper leg edema or erythema Neuro: Alert, the motor exam appears grossly intact in the upper and lower extremities     Lab Results:  Lab Results  Component Value Date   WBC 13.4* 09/26/2014   HGB 13.9 09/26/2014   HCT 41.2 09/26/2014   MCV 88.6 09/26/2014   PLT 119* 09/26/2014   NEUTROABS 11.5* 09/26/2014     Medications: I have reviewed the patient's current medications.  Assessment/Plan: 1. Glioblastoma Multiforme, status post resection of a right temporal mass 08/20/2014   MGMT methylation not detected  Negative for IDH1 and IDH2 mutations  Initiation of adjuvant temozolomide and radiation 09/11/2014 2. altered mental status secondary to #1  3. history of coronary artery disease  4. 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 sleep apnea     Disposition:  [name] appears stable. He is tolerating the temozolomide and radiation well. The plan is to continue the current therapy. We decrease the Decadron to 2 mg daily. We will see him for an office visit in 2 weeks.  Betsy Coder, MD  09/26/2014  8:45 AM

## 2014-09-29 ENCOUNTER — Ambulatory Visit
Admission: RE | Admit: 2014-09-29 | Discharge: 2014-09-29 | Disposition: A | Discharge status: Home / Self Care | Payer: Medicare Other | Source: Ambulatory Visit | Attending: Radiation Oncology | Admitting: Radiation Oncology

## 2014-09-29 DIAGNOSIS — Z510 Encounter for antineoplastic radiation therapy: Secondary | ICD-10-CM | POA: Diagnosis not present

## 2014-09-30 ENCOUNTER — Encounter: Payer: Self-pay | Admitting: Radiation Oncology

## 2014-09-30 ENCOUNTER — Ambulatory Visit
Admission: RE | Admit: 2014-09-30 | Discharge: 2014-09-30 | Disposition: A | Discharge status: Home / Self Care | Payer: Medicare Other | Source: Ambulatory Visit | Attending: Radiation Oncology | Admitting: Radiation Oncology

## 2014-09-30 ENCOUNTER — Ambulatory Visit
Admission: RE | Admit: 2014-09-30 | Discharge: 2014-09-30 | Disposition: A | Discharge status: Home / Self Care | Payer: BC Managed Care – PPO | Source: Ambulatory Visit | Attending: Radiation Oncology | Admitting: Radiation Oncology

## 2014-09-30 VITALS — BP 106/56 | HR 53 | Temp 97.5°F | Resp 18 | Ht 74.0 in | Wt 257.1 lb

## 2014-09-30 DIAGNOSIS — C719 Malignant neoplasm of brain, unspecified: Secondary | ICD-10-CM

## 2014-09-30 DIAGNOSIS — Z510 Encounter for antineoplastic radiation therapy: Secondary | ICD-10-CM | POA: Diagnosis not present

## 2014-09-30 NOTE — Progress Notes (Signed)
  Radiation Oncology         [phone] ________________________________  Name: [name] MRN: [id number]  Date: 09/30/2014  DOB: [birth year]  Weekly Radiation Therapy Management  DIAGNOSIS: Glioblastoma presenting in the right temporal brain   Current Dose: 28 Gy     Planned Dose:  60 Gy  Narrative . . . . . . . . The patient presents for routine under treatment assessment.                                   The patient is without complaint. He is accompanied by his granddaughter on evaluation today. Patient seems to be a little more alert and less confused                                 Set-up films were reviewed.                                 The chart was checked. Physical Findings. . .  height is 6\' 2"  (1.88 m) and weight is 257 lb 1.6 oz (116.62 kg). His other (comment) temperature is 97.5 F (36.4 C). His blood pressure is 106/56 and his pulse is 53. His respiration is 18. . Mild erythema to the right temporal scalp area. No secondary infection noted in the oral cavity. The lungs are clear. The heart has a regular rhythm and rate Impression . . . . . . . The patient is tolerating radiation and Temodar well  Plan . . . . . . . . . . . . Continue treatment as planned.  ________________________________   Blair Promise, PhD, MD

## 2014-09-30 NOTE — Progress Notes (Signed)
wekly rd txs brain, erythema, splotchy, itches some, using biafine bid, no pain, no head aches, dizzy ness or blurred vision stated, decadron 2mg  daily,looks like start of thrush on tongue, coffee taste bad now stated,otherwise great appetite, sleeps well, using walker, , 10:16 AM

## 2014-10-01 ENCOUNTER — Ambulatory Visit
Admission: RE | Admit: 2014-10-01 | Discharge: 2014-10-01 | Disposition: A | Discharge status: Home / Self Care | Payer: Medicare Other | Source: Ambulatory Visit | Attending: Radiation Oncology | Admitting: Radiation Oncology

## 2014-10-01 DIAGNOSIS — Z510 Encounter for antineoplastic radiation therapy: Secondary | ICD-10-CM | POA: Diagnosis not present

## 2014-10-02 ENCOUNTER — Ambulatory Visit
Admission: RE | Admit: 2014-10-02 | Discharge: 2014-10-02 | Disposition: A | Discharge status: Home / Self Care | Payer: Medicare Other | Source: Ambulatory Visit | Attending: Radiation Oncology | Admitting: Radiation Oncology

## 2014-10-02 DIAGNOSIS — Z510 Encounter for antineoplastic radiation therapy: Secondary | ICD-10-CM | POA: Diagnosis not present

## 2014-10-03 ENCOUNTER — Ambulatory Visit
Admission: RE | Admit: 2014-10-03 | Discharge: 2014-10-03 | Disposition: A | Discharge status: Home / Self Care | Payer: Medicare Other | Source: Ambulatory Visit | Attending: Radiation Oncology | Admitting: Radiation Oncology

## 2014-10-03 DIAGNOSIS — Z510 Encounter for antineoplastic radiation therapy: Secondary | ICD-10-CM | POA: Diagnosis not present

## 2014-10-06 ENCOUNTER — Ambulatory Visit
Admission: RE | Admit: 2014-10-06 | Discharge: 2014-10-06 | Disposition: A | Discharge status: Home / Self Care | Payer: Medicare Other | Source: Ambulatory Visit | Attending: Radiation Oncology | Admitting: Radiation Oncology

## 2014-10-06 DIAGNOSIS — Z510 Encounter for antineoplastic radiation therapy: Secondary | ICD-10-CM | POA: Diagnosis not present

## 2014-10-07 ENCOUNTER — Ambulatory Visit
Admission: RE | Admit: 2014-10-07 | Discharge: 2014-10-07 | Disposition: A | Discharge status: Home / Self Care | Payer: Medicare Other | Source: Ambulatory Visit | Attending: Radiation Oncology | Admitting: Radiation Oncology

## 2014-10-07 ENCOUNTER — Ambulatory Visit
Admission: RE | Admit: 2014-10-07 | Discharge: 2014-10-07 | Disposition: A | Discharge status: Home / Self Care | Payer: BC Managed Care – PPO | Source: Ambulatory Visit | Attending: Radiation Oncology | Admitting: Radiation Oncology

## 2014-10-07 ENCOUNTER — Encounter: Payer: Self-pay | Admitting: Radiation Oncology

## 2014-10-07 VITALS — BP 93/57 | HR 56 | Temp 97.9°F | Resp 16 | Ht 74.0 in | Wt 257.2 lb

## 2014-10-07 DIAGNOSIS — C712 Malignant neoplasm of temporal lobe: Secondary | ICD-10-CM | POA: Diagnosis not present

## 2014-10-07 DIAGNOSIS — Z510 Encounter for antineoplastic radiation therapy: Secondary | ICD-10-CM | POA: Diagnosis present

## 2014-10-07 DIAGNOSIS — C719 Malignant neoplasm of brain, unspecified: Secondary | ICD-10-CM

## 2014-10-07 MED ORDER — BIAFINE EX EMUL
Freq: Once | CUTANEOUS | Status: AC
  Administered 2014-10-07: 11:00:00 via TOPICAL

## 2014-10-07 NOTE — Addendum Note (Signed)
Encounter addended by: Jacqulyn Liner, RN on: 10/07/2014 11:26 AM<BR>     Documentation filed: Inpatient MAR

## 2014-10-07 NOTE — Progress Notes (Signed)
[name] has completed 19 fractions to his right temporal lobe.  He reports slight pain in his forehead area.  He reports having a dizzy spell this morning before breakfast.  He was getting up on a ladder to change a furnace filter.  He said it lasted for 5 minutes. He said he felt the dizziness in the right side of his head.  He denies any vision changes or nausea.  He continues to take decadron 2 mg daily.  His wife said he continues to be confused.  He states his balance is OK.  His now using a cane at home and a walker when he comes for treatment.  He continues to have physical therapy.  Orthostatic vitals take due to the dizziness: bp sitting 93/51, hr 49, bp standing 93/57, hr 56.

## 2014-10-07 NOTE — Progress Notes (Signed)
  Radiation Oncology         [phone] ________________________________  Name: [name] MRN: [id number]  Date: 10/07/2014  DOB: 21-Oct-1935  Weekly Radiation Therapy Management  DIAGNOSIS: Glioblastoma presenting in the right temporal brain  Current Dose: 38 Gy     Planned Dose:  60 Gy  Narrative . . . . . . . . The patient presents for routine under treatment assessment.                                   The patient is without complaint for an episode of mild dizziness when changing air filter. Patient did get up on a the ladder. I recommended he not do this again.  Patient did not loose any consciousness are actually fall. He continues on Decadron 2 mg daily                                 Set-up films were reviewed.                                 The chart was checked. Physical Findings. . .  height is 6\' 2"  (1.88 m) and weight is 257 lb 3.2 oz (116.665 kg). His oral temperature is 97.9 F (36.6 C). His blood pressure is 93/57 and his pulse is 56. His respiration is 16 and oxygen saturation is 98%. . Patient is alert and oriented. He ambulates in our department with a walker. The right scalp area shows some erythema but no skin breakdown. The oral cavity is moist without secondary infection. On neurological examination motor strength is 5 out of 5 in the proximal and distal muscle groups in the upper and lower extremities. Impression . . . . . . . The patient is tolerating radiation. Plan . . . . . . . . . . . . Continue treatment as planned.  ________________________________   Blair Promise, PhD, MD

## 2014-10-08 ENCOUNTER — Telehealth: Payer: Self-pay | Admitting: *Deleted

## 2014-10-08 ENCOUNTER — Other Ambulatory Visit: Payer: Self-pay | Admitting: *Deleted

## 2014-10-08 ENCOUNTER — Telehealth: Payer: Self-pay | Admitting: Oncology

## 2014-10-08 ENCOUNTER — Ambulatory Visit
Admission: RE | Admit: 2014-10-08 | Discharge: 2014-10-08 | Disposition: A | Discharge status: Home / Self Care | Payer: Medicare Other | Source: Ambulatory Visit | Attending: Radiation Oncology | Admitting: Radiation Oncology

## 2014-10-08 DIAGNOSIS — Z510 Encounter for antineoplastic radiation therapy: Secondary | ICD-10-CM | POA: Diagnosis not present

## 2014-10-08 MED ORDER — TEMOZOLOMIDE 180 MG PO CAPS: 180.0000 mg | ORAL_CAPSULE | Freq: Every day | ORAL | Status: DC

## 2014-10-08 NOTE — Telephone Encounter (Signed)
Called and left a message asking for a call back regarding Zen's Temodar.  He has 5 tablets left and is wondering if he needs a refill.

## 2014-10-08 NOTE — Telephone Encounter (Signed)
Notified wife that script for #3 Temodar 180 mg capsules were called in and electronically sent to [place]. Made her aware that he will take Temodar till end of radiation at this dose. Then after a break, will take on a monthly basis for 5 days, but dosing will be higher.

## 2014-10-08 NOTE — Telephone Encounter (Signed)
Patient reports he only has #5 Temodar capsules of 180 mg on hand and has #10 more radiation therapy sessions to complete. Will refill #5 per Dr. Benay Spice. Called [facility] and left voice mail for refill and also sent electronically-requested they call with confirmation.

## 2014-10-09 ENCOUNTER — Other Ambulatory Visit: Payer: Self-pay | Admitting: *Deleted

## 2014-10-09 ENCOUNTER — Other Ambulatory Visit (HOSPITAL_BASED_OUTPATIENT_CLINIC_OR_DEPARTMENT_OTHER): Payer: Medicare Other

## 2014-10-09 ENCOUNTER — Ambulatory Visit (HOSPITAL_BASED_OUTPATIENT_CLINIC_OR_DEPARTMENT_OTHER): Payer: Medicare Other | Admitting: Physician Assistant

## 2014-10-09 ENCOUNTER — Ambulatory Visit
Admission: RE | Admit: 2014-10-09 | Discharge: 2014-10-09 | Disposition: A | Discharge status: Home / Self Care | Payer: Medicare Other | Source: Ambulatory Visit | Attending: Radiation Oncology | Admitting: Radiation Oncology

## 2014-10-09 ENCOUNTER — Encounter: Payer: Self-pay | Admitting: Physician Assistant

## 2014-10-09 ENCOUNTER — Telehealth: Payer: Self-pay | Admitting: Oncology

## 2014-10-09 VITALS — BP 99/62 | HR 55 | Temp 99.1°F | Resp 18 | Ht 74.0 in | Wt 257.8 lb

## 2014-10-09 DIAGNOSIS — Z510 Encounter for antineoplastic radiation therapy: Secondary | ICD-10-CM | POA: Diagnosis not present

## 2014-10-09 DIAGNOSIS — R4182 Altered mental status, unspecified: Secondary | ICD-10-CM

## 2014-10-09 DIAGNOSIS — C712 Malignant neoplasm of temporal lobe: Secondary | ICD-10-CM

## 2014-10-09 DIAGNOSIS — C719 Malignant neoplasm of brain, unspecified: Secondary | ICD-10-CM

## 2014-10-09 LAB — CBC WITH DIFFERENTIAL/PLATELET
BASO%: 0.7 % (ref 0.0–2.0)
BASOS ABS: 0.1 10*3/uL (ref 0.0–0.1)
EOS ABS: 0.2 10*3/uL (ref 0.0–0.5)
EOS%: 2.3 % (ref 0.0–7.0)
HEMATOCRIT: 40.6 % (ref 38.4–49.9)
HGB: 13.2 g/dL (ref 13.0–17.1)
LYMPH%: 14 % (ref 14.0–49.0)
MCH: 29.5 pg (ref 27.2–33.4)
MCHC: 32.5 g/dL (ref 32.0–36.0)
MCV: 90.8 fL (ref 79.3–98.0)
MONO#: 0.8 10*3/uL (ref 0.1–0.9)
MONO%: 10.3 % (ref 0.0–14.0)
NEUT%: 72.7 % (ref 39.0–75.0)
NEUTROS ABS: 5.3 10*3/uL (ref 1.5–6.5)
PLATELETS: 113 10*3/uL — AB (ref 140–400)
RBC: 4.47 10*6/uL (ref 4.20–5.82)
RDW: 17.4 % — ABNORMAL HIGH (ref 11.0–14.6)
WBC: 7.3 10*3/uL (ref 4.0–10.3)
lymph#: 1 10*3/uL (ref 0.9–3.3)

## 2014-10-09 MED ORDER — TEMOZOLOMIDE 180 MG PO CAPS: 180.0000 mg | ORAL_CAPSULE | Freq: Every day | ORAL | Status: DC

## 2014-10-09 NOTE — Patient Instructions (Signed)
You may resume your 81 mg aspirin and your vitamin D3 1000 units daily Continue your Decadron at the current dose Completed her course of Temodar and radiation as scheduled Follow-up in 2 weeks

## 2014-10-09 NOTE — Progress Notes (Signed)
Per Dr. Benay Spice : recheck cbc in 1 week. Order placed and POF sent.

## 2014-10-09 NOTE — Progress Notes (Signed)
  [name] OFFICE PROGRESS NOTE   Diagnosis: Glioblastoma  INTERVAL HISTORY:   [name] returns as scheduled. He reports tolerating the temozolomide and radiation well. No nausea or diarrhea. He continues to have mild leg weakness. He complains of constipation but reports that it is currently well managed with Dulcolax stool softeners. He reports a dizzy spell he experienced one morning earlier this week. He states the spell resolved after he ate something He also reports some fatigue.Marland Kitchen He has been off of his 81 mg aspirin tablet as well as his vitamin D3, 1000 units per day since before his surgery..his wife states that he will need an additional 9 doses of Temodar to complete his radiation.  Objective:  Vital signs in last 24 hours:  Blood pressure 99/62, pulse 55, temperature 99.1 F (37.3 C), temperature source Oral, resp. rate 18, height _0  (1.88 m), weight 257 lb 12.8 oz (116.937 kg).    HEENT: No thrush or ulcers Resp: Lungs clear bilaterally Cardio: Regular rate and rhythm GI: No hepatomegaly, nontender Vascular: Chronic stasis change at the lower legs bilaterally with trace edema at the right greater than left lower leg. No upper leg edema or erythema Neuro: Alert, the motor exam appears grossly intact in the upper and lower extremities     Lab Results:  Lab Results  Component Value Date   WBC 7.3 10/09/2014   HGB 13.2 10/09/2014   HCT 40.6 10/09/2014   MCV 90.8 10/09/2014   PLT 113* 10/09/2014   NEUTROABS 5.3 10/09/2014     Medications: I have reviewed the patient's current medications.  Assessment/Plan: 1. Glioblastoma Multiforme, status post resection of a right temporal mass 08/20/2014   MGMT methylation not detected  Negative for IDH1 and IDH2 mutations  Initiation of adjuvant temozolomide and radiation 09/11/2014 2. altered mental status secondary to #1  3. history of coronary artery disease  4. 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 sleep apnea     Disposition:  [name] appears stable. He is tolerating the temozolomide and radiation well. The plan is to complete the current course of therapy. Continue the Decadron to 2 mg daily. We will see him for an office visit in 2 weeks..The plan will be for him to complete his course of radiation and Temodar. He will then have a 3 to four-week break and then begin Temodar 5 consecutive days every 4 weeks.it is expected that he would have her restaging MRI of the brain ordered by radiation oncology during that 3-4 week break.he may resume his 81 mg aspirin and vitamin D3 at their current doses.  Carlton Adam, PA-C  10/09/2014  10:16 AM

## 2014-10-09 NOTE — Telephone Encounter (Signed)
gv pt appt schedule for nov.  °

## 2014-10-10 ENCOUNTER — Telehealth: Payer: Self-pay | Admitting: Oncology

## 2014-10-10 ENCOUNTER — Ambulatory Visit
Admission: RE | Admit: 2014-10-10 | Discharge: 2014-10-10 | Disposition: A | Discharge status: Home / Self Care | Payer: Medicare Other | Source: Ambulatory Visit | Attending: Radiation Oncology | Admitting: Radiation Oncology

## 2014-10-10 DIAGNOSIS — Z510 Encounter for antineoplastic radiation therapy: Secondary | ICD-10-CM | POA: Diagnosis not present

## 2014-10-10 NOTE — Telephone Encounter (Signed)
lvm for pt regarding to NOV appt.. °

## 2014-10-13 ENCOUNTER — Ambulatory Visit
Admission: RE | Admit: 2014-10-13 | Discharge: 2014-10-13 | Disposition: A | Discharge status: Home / Self Care | Payer: Medicare Other | Source: Ambulatory Visit | Attending: Radiation Oncology | Admitting: Radiation Oncology

## 2014-10-13 ENCOUNTER — Telehealth: Payer: Self-pay | Admitting: Cardiology

## 2014-10-13 DIAGNOSIS — Z510 Encounter for antineoplastic radiation therapy: Secondary | ICD-10-CM | POA: Diagnosis not present

## 2014-10-13 NOTE — Telephone Encounter (Signed)
Returned call to patient no answer.Unable to leave a message no answering machine.

## 2014-10-13 NOTE — Telephone Encounter (Signed)
New message     Pt is undergoing radiation and chemo because of a brain tumor.  He has an appt on 10-24-14.  Question about metoprolol 50mg .  He takes 25mg  at am and 25mg  at pm.  Instead of cutting pills, can he get a presc for 25mg ?  If yes, call it in to [facility] at [phone]

## 2014-10-14 ENCOUNTER — Other Ambulatory Visit: Payer: Self-pay

## 2014-10-14 ENCOUNTER — Ambulatory Visit
Admission: RE | Admit: 2014-10-14 | Discharge: 2014-10-14 | Disposition: A | Discharge status: Home / Self Care | Payer: Medicare Other | Source: Ambulatory Visit | Attending: Radiation Oncology | Admitting: Radiation Oncology

## 2014-10-14 DIAGNOSIS — Z510 Encounter for antineoplastic radiation therapy: Secondary | ICD-10-CM | POA: Diagnosis not present

## 2014-10-14 MED ORDER — METOPROLOL TARTRATE 25 MG PO TABS: 25.0000 mg | ORAL_TABLET | Freq: Two times a day (BID) | ORAL | Status: DC

## 2014-10-15 ENCOUNTER — Ambulatory Visit
Admission: RE | Admit: 2014-10-15 | Discharge: 2014-10-15 | Disposition: A | Discharge status: Home / Self Care | Payer: Medicare Other | Source: Ambulatory Visit | Attending: Radiation Oncology | Admitting: Radiation Oncology

## 2014-10-15 ENCOUNTER — Ambulatory Visit
Admission: RE | Admit: 2014-10-15 | Discharge: 2014-10-15 | Disposition: A | Discharge status: Home / Self Care | Payer: BC Managed Care – PPO | Source: Ambulatory Visit | Attending: Radiation Oncology | Admitting: Radiation Oncology

## 2014-10-15 ENCOUNTER — Encounter: Payer: Self-pay | Admitting: Radiation Oncology

## 2014-10-15 VITALS — BP 109/82 | HR 54 | Temp 97.4°F | Resp 20 | Ht 74.0 in | Wt 258.5 lb

## 2014-10-15 DIAGNOSIS — Z510 Encounter for antineoplastic radiation therapy: Secondary | ICD-10-CM | POA: Diagnosis not present

## 2014-10-15 DIAGNOSIS — C719 Malignant neoplasm of brain, unspecified: Secondary | ICD-10-CM

## 2014-10-15 NOTE — Progress Notes (Signed)
[name] has completed 25 fractions to his right temporal lobe.  He denies pain and headaches.  He denies having any dizzy spells and nausea.  He continues to take decadron 2 mg daily and temodar.  He reports being able to use a cane now instead of a walker.  He reports that his legs still get weak occasionally.  He has peeling behind his right ear on the top of his ear.  He is using biafine cream.  He reports the skin behind his right ear is itching.  He reports occasional confusion with going into a room and forgetting why he was there.  He reports fatigue.  He reports that the mask is tight on his face but not unbearable.

## 2014-10-15 NOTE — Progress Notes (Signed)
  Radiation Oncology         [phone] ________________________________  Name: [name] MRN: [id number]  Date: 10/15/2014  DOB: 25-Jun-1935  Weekly Radiation Therapy Management  DIAGNOSIS: Glioblastoma presenting in the right temporal brain  Current Dose: 50 Gy     Planned Dose:  60 Gy  Narrative . . . . . . . . The patient presents for routine under treatment assessment.                                   The patient is without complaint.  He denies any nausea or headaches. Patient is only using his cane at this time and stopped using his walker. He admits to some mild confusion                                 Set-up films were reviewed.                                 The chart was checked. Physical Findings. . .  height is 6\' 2"  (1.88 m) and weight is 258 lb 8 oz (117.255 kg). His oral temperature is 97.4 F (36.3 C). His blood pressure is 109/82 and his pulse is 54. His respiration is 20 and oxygen saturation is 94%. . The lungs are clear. The heart has a regular rhythm and rate. The right scalp area shows mild erythema without skin breakdown. The oral cavity is free of secondary infection. Impression . . . . . . . The patient is tolerating radiation. Plan . . . . . . . . . . . . Continue treatment as planned.  ________________________________   Blair Promise, PhD, MD

## 2014-10-16 ENCOUNTER — Other Ambulatory Visit (HOSPITAL_BASED_OUTPATIENT_CLINIC_OR_DEPARTMENT_OTHER): Payer: Medicare Other

## 2014-10-16 ENCOUNTER — Ambulatory Visit
Admission: RE | Admit: 2014-10-16 | Discharge: 2014-10-16 | Disposition: A | Discharge status: Home / Self Care | Payer: Medicare Other | Source: Ambulatory Visit | Attending: Radiation Oncology | Admitting: Radiation Oncology

## 2014-10-16 DIAGNOSIS — Z510 Encounter for antineoplastic radiation therapy: Secondary | ICD-10-CM | POA: Diagnosis not present

## 2014-10-16 DIAGNOSIS — C719 Malignant neoplasm of brain, unspecified: Secondary | ICD-10-CM

## 2014-10-16 DIAGNOSIS — C712 Malignant neoplasm of temporal lobe: Secondary | ICD-10-CM

## 2014-10-16 LAB — CBC WITH DIFFERENTIAL/PLATELET
BASO%: 0.5 % (ref 0.0–2.0)
Basophils Absolute: 0 10*3/uL (ref 0.0–0.1)
EOS%: 0.7 % (ref 0.0–7.0)
Eosinophils Absolute: 0.1 10*3/uL (ref 0.0–0.5)
HCT: 42.2 % (ref 38.4–49.9)
HGB: 13.7 g/dL (ref 13.0–17.1)
LYMPH%: 7.7 % — ABNORMAL LOW (ref 14.0–49.0)
MCH: 29.7 pg (ref 27.2–33.4)
MCHC: 32.5 g/dL (ref 32.0–36.0)
MCV: 91.4 fL (ref 79.3–98.0)
MONO#: 0.6 10*3/uL (ref 0.1–0.9)
MONO%: 8.3 % (ref 0.0–14.0)
NEUT#: 6 10*3/uL (ref 1.5–6.5)
NEUT%: 82.8 % — AB (ref 39.0–75.0)
PLATELETS: 146 10*3/uL (ref 140–400)
RBC: 4.62 10*6/uL (ref 4.20–5.82)
RDW: 17.9 % — ABNORMAL HIGH (ref 11.0–14.6)
WBC: 7.3 10*3/uL (ref 4.0–10.3)
lymph#: 0.6 10*3/uL — ABNORMAL LOW (ref 0.9–3.3)

## 2014-10-16 LAB — TECHNOLOGIST REVIEW

## 2014-10-17 ENCOUNTER — Ambulatory Visit
Admission: RE | Admit: 2014-10-17 | Discharge: 2014-10-17 | Disposition: A | Discharge status: Home / Self Care | Payer: Medicare Other | Source: Ambulatory Visit | Attending: Radiation Oncology | Admitting: Radiation Oncology

## 2014-10-17 DIAGNOSIS — Z510 Encounter for antineoplastic radiation therapy: Secondary | ICD-10-CM | POA: Diagnosis not present

## 2014-10-20 ENCOUNTER — Ambulatory Visit
Admission: RE | Admit: 2014-10-20 | Discharge: 2014-10-20 | Disposition: A | Discharge status: Home / Self Care | Payer: Medicare Other | Source: Ambulatory Visit | Attending: Radiation Oncology | Admitting: Radiation Oncology

## 2014-10-20 DIAGNOSIS — Z510 Encounter for antineoplastic radiation therapy: Secondary | ICD-10-CM | POA: Diagnosis not present

## 2014-10-21 ENCOUNTER — Ambulatory Visit
Admission: RE | Admit: 2014-10-21 | Discharge: 2014-10-21 | Disposition: A | Discharge status: Home / Self Care | Payer: Medicare Other | Source: Ambulatory Visit | Attending: Radiation Oncology | Admitting: Radiation Oncology

## 2014-10-21 ENCOUNTER — Ambulatory Visit
Admission: RE | Admit: 2014-10-21 | Discharge: 2014-10-21 | Disposition: A | Discharge status: Home / Self Care | Payer: BC Managed Care – PPO | Source: Ambulatory Visit | Attending: Radiation Oncology | Admitting: Radiation Oncology

## 2014-10-21 ENCOUNTER — Encounter: Payer: Self-pay | Admitting: Radiation Oncology

## 2014-10-21 VITALS — BP 102/66 | HR 57 | Temp 98.5°F | Ht 74.0 in | Wt 258.6 lb

## 2014-10-21 DIAGNOSIS — C719 Malignant neoplasm of brain, unspecified: Secondary | ICD-10-CM

## 2014-10-21 DIAGNOSIS — Z510 Encounter for antineoplastic radiation therapy: Secondary | ICD-10-CM | POA: Diagnosis not present

## 2014-10-21 NOTE — Progress Notes (Signed)
Mr. Magnan has received 29 fractions to his right temporal lobe.  Note hesitant speech, but speech is clear.  He denies any headaches, but reports vision changes with his present diagnosis. Risk to fall.  Scalp is slightly reddened in the tx. Field.

## 2014-10-21 NOTE — Progress Notes (Signed)
  Radiation Oncology         [phone] ________________________________  Name: [name] MRN: [id number]  Date: 10/21/2014  DOB: [birth year]  Weekly Radiation Therapy Management  DIAGNOSIS: Glioblastoma presenting in the right temporal brain  Current Dose: 58 Gy     Planned Dose:  60 Gy  Narrative . . . . . . . . The patient presents for routine under treatment assessment.                                   The patient is without complaint. He denies any nausea or headaches. He did get some mild visual problems but feels this is related to his glasses not feeling well.  patient has minimal scalp irritation.                                 Set-up films were reviewed.                                 The chart was checked. Physical Findings. . .  height is 6\' 2"  (1.88 m) and weight is 258 lb 9.6 oz (117.3 kg). His temperature is 98.5 F (36.9 C). His blood pressure is 102/66 and his pulse is 57. . The scalp shows some mild erythema without any skin breakdown. The oral cavity is moist without secondary infection. Impression . . . . . . . The patient is tolerating radiation. Plan . . . . . . . . . . . . Continue treatment as planned.  Today the wife was given instructions on a Decadron taper which will extend over approximately 2 weeks. He is only taking 1 Decadron per day at this time.  ________________________________   Blair Promise, PhD, MD

## 2014-10-22 ENCOUNTER — Ambulatory Visit
Admission: RE | Admit: 2014-10-22 | Discharge: 2014-10-22 | Disposition: A | Discharge status: Home / Self Care | Payer: Medicare Other | Source: Ambulatory Visit | Attending: Radiation Oncology | Admitting: Radiation Oncology

## 2014-10-22 ENCOUNTER — Ambulatory Visit: Payer: Medicare Other

## 2014-10-22 DIAGNOSIS — Z510 Encounter for antineoplastic radiation therapy: Secondary | ICD-10-CM | POA: Diagnosis not present

## 2014-10-22 NOTE — Progress Notes (Addendum)
[name] and his wife stopped by the clinic and had a question about the decadron taper.  Advised them to take 1/2 tablet for 7 days and then 1/2 tablet every other day for 7 days then stop taking.  They also have a question about Temodar.  They have 1 tablet left and are wondering if he needs to keep taking it.  Per Amy in Dr. Gearldine Shown office, [name] should take his last pill tonight and then he does not needs to take any more.  [name] and his wife and they verbalized understanding.

## 2014-10-22 NOTE — Telephone Encounter (Signed)
Returned call to patient's wife she stated everything taken care of.Stated husband taking metoprolol 25 mg twice a day instead of 50 mg 1/2 twice a day.Stated better not to cut pills in half.Stated he is finished with chemo and is tired .Stated he is still confused.Stated he will keep appointment with Dr.Jordan 10/24/14 at 8:00 am.

## 2014-10-23 ENCOUNTER — Ambulatory Visit: Payer: Medicare Other

## 2014-10-23 ENCOUNTER — Telehealth: Payer: Self-pay | Admitting: Nurse Practitioner

## 2014-10-23 ENCOUNTER — Ambulatory Visit (HOSPITAL_BASED_OUTPATIENT_CLINIC_OR_DEPARTMENT_OTHER): Payer: Medicare Other | Admitting: Nurse Practitioner

## 2014-10-23 ENCOUNTER — Other Ambulatory Visit (HOSPITAL_BASED_OUTPATIENT_CLINIC_OR_DEPARTMENT_OTHER): Payer: Medicare Other

## 2014-10-23 VITALS — BP 104/67 | HR 59 | Temp 98.6°F | Resp 18 | Ht 74.0 in | Wt 257.0 lb

## 2014-10-23 DIAGNOSIS — C719 Malignant neoplasm of brain, unspecified: Secondary | ICD-10-CM

## 2014-10-23 DIAGNOSIS — C712 Malignant neoplasm of temporal lobe: Secondary | ICD-10-CM

## 2014-10-23 LAB — COMPREHENSIVE METABOLIC PANEL (CC13)
ALK PHOS: 47 U/L (ref 40–150)
ALT: 16 U/L (ref 0–55)
AST: 12 U/L (ref 5–34)
Albumin: 3.1 g/dL — ABNORMAL LOW (ref 3.5–5.0)
Anion Gap: 6 mEq/L (ref 3–11)
BUN: 12.2 mg/dL (ref 7.0–26.0)
CO2: 26 mEq/L (ref 22–29)
CREATININE: 1.2 mg/dL (ref 0.7–1.3)
Calcium: 8.8 mg/dL (ref 8.4–10.4)
Chloride: 109 mEq/L (ref 98–109)
Glucose: 98 mg/dl (ref 70–140)
POTASSIUM: 3.8 meq/L (ref 3.5–5.1)
Sodium: 141 mEq/L (ref 136–145)
Total Bilirubin: 0.73 mg/dL (ref 0.20–1.20)
Total Protein: 5 g/dL — ABNORMAL LOW (ref 6.4–8.3)

## 2014-10-23 LAB — CBC WITH DIFFERENTIAL/PLATELET
BASO%: 1 % (ref 0.0–2.0)
BASOS ABS: 0.1 10*3/uL (ref 0.0–0.1)
EOS%: 2 % (ref 0.0–7.0)
Eosinophils Absolute: 0.2 10*3/uL (ref 0.0–0.5)
HCT: 39.6 % (ref 38.4–49.9)
HEMOGLOBIN: 13 g/dL (ref 13.0–17.1)
LYMPH%: 11.6 % — ABNORMAL LOW (ref 14.0–49.0)
MCH: 29.8 pg (ref 27.2–33.4)
MCHC: 32.9 g/dL (ref 32.0–36.0)
MCV: 90.8 fL (ref 79.3–98.0)
MONO#: 1.1 10*3/uL — ABNORMAL HIGH (ref 0.1–0.9)
MONO%: 14.7 % — AB (ref 0.0–14.0)
NEUT#: 5.3 10*3/uL (ref 1.5–6.5)
NEUT%: 70.7 % (ref 39.0–75.0)
Platelets: 122 10*3/uL — ABNORMAL LOW (ref 140–400)
RBC: 4.36 10*6/uL (ref 4.20–5.82)
RDW: 17.7 % — AB (ref 11.0–14.6)
WBC: 7.4 10*3/uL (ref 4.0–10.3)
lymph#: 0.9 10*3/uL (ref 0.9–3.3)

## 2014-10-23 NOTE — Telephone Encounter (Signed)
Pt confirmed labs/ov per 11/19 POF, gave pt AVS.... KJ

## 2014-10-23 NOTE — Progress Notes (Addendum)
  [name] OFFICE PROGRESS NOTE   Diagnosis:  Glioblastoma  INTERVAL HISTORY:   [name] returns as scheduled. He completed radiation/Temodar 10/22/2014. He denies nausea/vomiting. No mouth sores. No diarrhea. Constipation is better since beginning MiraLAX and a stool softener. No skin rash. No falls or balance problems. He denies pain. No headaches. No diplopia. His wife continues to note memory problems.  Objective:  Vital signs in last 24 hours:  Blood pressure 104/67, pulse 59, temperature 98.6 F (37 C), resp. rate 18, height $RemoveBe'6\' 2"'eFlzjOTJP$  (1.88 m), weight 257 lb (116.574 kg).    HEENT: no thrush or ulcers. Resp: faint rales at both lung bases. No respiratory distress. Cardio: regular rate and rhythm. GI: abdomen soft and nontender. No hepatomegaly. Vascular: 1+ pitting edema at the lower legs bilaterally. Neuro: alert and oriented. Motor strength 5 over 5.  Skin: no rash.    Lab Results:  Lab Results  Component Value Date   WBC 7.4 10/23/2014   HGB 13.0 10/23/2014   HCT 39.6 10/23/2014   MCV 90.8 10/23/2014   PLT 122* 10/23/2014   NEUTROABS 5.3 10/23/2014    Imaging:  No results found.  Medications: I have reviewed the patient's current medications.  Assessment/Plan: 1. Glioblastoma Multiforme, status post resection of a right temporal mass 08/20/2014   MGMT methylation not detected  Negative for IDH1 and IDH2 mutations  Initiation of adjuvant temozolomide and radiation 09/11/2014; completion of radiation 10/22/2014. 2. Altered mental status secondary to #1  3. History of coronary artery disease  4. 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 Sleep apnea    Disposition: [name] appears stable. He completed concurrent radiation/temozolomide 10/22/2014. He will begin temozolomide 5 days every 4 weeks on 11/17/2014.   We will have him return for a CBC on 11/14/2014.  He is completing a Decadron taper  as outlined by Dr. Sondra Come.  He will continue Septra to complete the current prescription and then discontinue.  We will see him in followup on 12/10/2013. He will contact the office in the interim with any problems.    Plan reviewed with [name]. 25 minutes were spent face-to-face at today's visit with the majority of that time involved in counseling/coordination of care.    Ned Card ANP/GNP-BC   10/23/2014  9:06 AM   This was a shared visit with Ned Card. [name] has completed adjuvant temozolomide and radiation. The plan is to begin monthly temozolomide on 11/17/2014.  We will schedule an MRI in approximately 1 month.  Julieanne Manson, M.D.

## 2014-10-24 ENCOUNTER — Ambulatory Visit: Payer: Medicare Other

## 2014-10-24 ENCOUNTER — Encounter: Payer: Self-pay | Admitting: Cardiology

## 2014-10-24 ENCOUNTER — Ambulatory Visit (INDEPENDENT_AMBULATORY_CARE_PROVIDER_SITE_OTHER): Payer: Medicare Other | Admitting: Cardiology

## 2014-10-24 VITALS — BP 109/69 | HR 57 | Ht 74.0 in | Wt 258.7 lb

## 2014-10-24 DIAGNOSIS — I872 Venous insufficiency (chronic) (peripheral): Secondary | ICD-10-CM

## 2014-10-24 DIAGNOSIS — E785 Hyperlipidemia, unspecified: Secondary | ICD-10-CM

## 2014-10-24 DIAGNOSIS — I10 Essential (primary) hypertension: Secondary | ICD-10-CM

## 2014-10-24 DIAGNOSIS — I2510 Atherosclerotic heart disease of native coronary artery without angina pectoris: Secondary | ICD-10-CM

## 2014-10-24 NOTE — Patient Instructions (Signed)
Continue your current therapy  OK to stop niacin  I will see you in 6 months

## 2014-10-24 NOTE — Progress Notes (Signed)
Gracy Racer Date of Birth: 11-Feb-1935   History of Present Illness: Mr. Nabers is seen today for followup of CAD. He has a history of coronary disease and is status post CABG in 2004. He had angioplasty of the posterior lateral branch in that same year. His last nuclear stress test in March of 2013 showed a large apical scar and large inferior wall scar without ischemia. Ejection fraction was 45%. He was admitted in September with neurologic changes and diagnosed with a glioblastoma. He had surgical excision and subsequent radiation and chemotherapy. Post craniotomy he had some AIVR and prolonged QT. This resolved. Echo at that time was a limited study that showed an EF 35-40%. He has no cardiac complaints. Denies CP, SOB, or palpitations. Still has problems with memory loss.  Current Outpatient Prescriptions on File Prior to Visit  Medication Sig Dispense Refill  . Cholecalciferol (VITAMIN D-3 PO) Take 1,000 Units by mouth daily.     Marland Kitchen dexamethasone (DECADRON) 2 MG tablet Take 2 mg by mouth daily.    Marland Kitchen docusate sodium 100 MG CAPS Take 100 mg by mouth 2 (two) times daily. 60 capsule 0  . levETIRAcetam (KEPPRA) 500 MG tablet Take 1 tablet (500 mg total) by mouth 2 (two) times daily. 60 tablet 1  . metoprolol (LOPRESSOR) 25 MG tablet Take 1 tablet (25 mg total) by mouth 2 (two) times daily. 60 tablet 0  . niacin (NIASPAN) 1000 MG CR tablet Take 1,000 mg by mouth at bedtime.      . nitroGLYCERIN (NITROSTAT) 0.4 MG SL tablet Place 1 tablet (0.4 mg total) under the tongue every 5 (five) minutes as needed. 25 tablet 6  . ondansetron (ZOFRAN) 8 MG tablet Take 1 tablet (8 mg total) by mouth as directed. Take one tablet 1 hour prior to Temodar dose and every 12 hours if needed for nausea 60 tablet 1  . polyethylene glycol (MIRALAX / GLYCOLAX) packet Take 17 g by mouth daily.    . simvastatin (ZOCOR) 40 MG tablet   0  . sulfamethoxazole-trimethoprim (BACTRIM DS) 800-160 MG per tablet Take 1 tablet by  mouth 3 (three) times a week. On Monday-Wednesday-Friday for 3 months 36 tablet 0  . temozolomide (TEMODAR) 180 MG capsule Take 1 capsule (180 mg total) by mouth daily. May take on an empty stomach or at bedtime to decrease nausea & vomiting. 9 capsule 0   No current facility-administered medications on file prior to visit.    No Known Allergies  Past Medical History  Diagnosis Date  . Coronary artery disease   . MI, old     INFERIOR  . Hypertension   . Hyperlipidemia   . Chronic venous insufficiency     and varicose veins  . Anterior myocardial infarction 1990    with subsequent PTCA  . Hypercholesterolemia   . Ulcer of right leg   . Obstructive sleep apnea   . Anemia   . Obesity     Past Surgical History  Procedure Laterality Date  . Cardiac catheterization  06/30/2003    EF 50%  . Coronary artery bypass graft  01/2003    X6. LIMA GRAFT TO THE LDA, FREE RADICAL GRAFT TO THE OM1, SEQUENTIAL SAPHENOUS VEIN GRAFT TO THE FIRST AND SECOND DIAGONAL BRANCES, AND A SEQUENTIAL  VEIN GRAFT TO THE PDA AND POSTERIOR LATERAL BRANCHES OF THE RIGHT CORONARY  . Coronary angioplasty  06/2003    POSTERIOR LATERAL BRANCH  . Tonsillectomy and adenoidectomy    .  Cardiovascular stress test  01/23/2009    EF 44%  . Craniotomy Right 08/20/2014    Procedure: CRANIOTOMY TUMOR EXCISION w/ BrainLab;  Surgeon: Newman Pies, MD;  Location: [facility];  Service: Neurosurgery;  Laterality: Right;  Right Craniotomy with brain lab for tumor    History  Smoking status  . Never Smoker   Smokeless tobacco  . Never Used    History  Alcohol Use No    Family History  Problem Relation Age of Onset  . Cancer Sister   . Heart disease    . Diabetes Brother     Review of Systems: As noted in HPI. All other systems were reviewed and are negative.  Physical Exam: BP 109/69 mmHg  Pulse 57  Ht 6\' 2"  (1.88 m)  Wt 258 lb 11.2 oz (117.346 kg)  BMI 33.20 kg/m2 He is a well-developed white male in no  acute distress. The HEENT exam reveals a right craniotomy scar. The carotids are 2+ without bruits.  There is no thyromegaly.  There is no JVD.  The lungs are clear.    The heart exam reveals a regular rate with a normal S1 and S2.  There are no murmurs, gallops, or rubs.  The PMI is not displaced.   Abdominal exam reveals good bowel sounds.   Lower extremities reveal 1+ edema with support hose in place. He is alert and oriented x3. Cranial nerves II - XII are intact.  Motor and sensory functions are intact.  The gait is normal.  LABORATORY DATA: Lab Results  Component Value Date   WBC 7.4 10/23/2014   HGB 13.0 10/23/2014   HCT 39.6 10/23/2014   PLT 122* 10/23/2014   GLUCOSE 98 10/23/2014   ALT 16 10/23/2014   AST 12 10/23/2014   NA 141 10/23/2014   K 3.8 10/23/2014   CL 100 08/20/2014   CREATININE 1.2 10/23/2014   BUN 12.2 10/23/2014   CO2 26 10/23/2014    Transthoracic Echocardiography  Patient:  Lakoda, Mcanany MR #:    [id number] Study Date: 08/21/2014 Gender:   M Age:    79 Height:   188 cm Weight:   117.9 kg BSA:    2.52 m^2 Pt. Status: Room:    3M14C  ADMITTING  Newman Pies [id number] ATTENDING  Newman Pies [id number] [place] Ouch, Patrici Ranks, Inpatient SONOGRAPHER Darlina Sicilian, RDCS  cc:  ------------------------------------------------------------------- LV EF: 35% -  40%  ------------------------------------------------------------------- Indications:   MI - old [greater than 8 weeks].  ------------------------------------------------------------------- History:  PMH:  Coronary artery disease. PMH:  Myocardial infarction. Risk factors: Hypertension. Dyslipidemia.  ------------------------------------------------------------------- Study Conclusions  - Left ventricle: The cavity size was mildly dilated. Systolic function was moderately reduced. The estimated ejection fraction was in  the range of 35% to 40%. Probable peri-apical severe hypokinesis. Doppler parameters are consistent with abnormal left ventricular relaxation (grade 1 diastolic dysfunction). - Aortic valve: There was no stenosis. There was trivial regurgitation. - Aorta: Mildly dilated aortic root. Aortic root dimension: 39 mm (ED). - Mitral valve: Mildly calcified annulus. Mildly calcified leaflets . There was mild regurgitation. - Left atrium: The atrium was mildly dilated. - Right ventricle: Poorly visualized. - Right atrium: Poorly visualized. - Tricuspid valve: Poorly visualized. - Pulmonary arteries: No complete TR doppler jet so unable to estimate PA systolic pressure. - Systemic veins: IVC not visualized. - Pericardium, extracardiac: A trivial pericardial effusion was identified posterior to the heart.  Impressions:  - Technically difficult  study. The apical images were basically uninterpretable despite use of Definity. Mildly dilated LV, EF probably around 35-40% with peri-apical severe hypokinesis. RV was not visualized. Mild MR. Could consider cardiac MRI to obtain more accurate determination of LV and RV function.  ------------------------------------------------------------------- Labs, prior tests, procedures, and surgery: Coronary artery bypass grafting.  Transthoracic echocardiography. M-mode, complete 2D, spectral Doppler, and color Doppler. Birthdate: Patient birthdate: 1935-03-11. Age: Patient is 79 yr old. Sex: Gender: male. BMI: 33.4 kg/m^2. Blood pressure:   112/64 Patient status: Inpatient. Study date: Study date: 08/21/2014. Study time: 10:31 AM. Location: Bedside.  -------------------------------------------------------------------  ------------------------------------------------------------------- Left ventricle: The cavity size was mildly dilated. Systolic function was moderately reduced. The estimated ejection fraction was  in the range of 35% to 40%. Probable peri-apical severe hypokinesis. Doppler parameters are consistent with abnormal left ventricular relaxation (grade 1 diastolic dysfunction).  ------------------------------------------------------------------- Aortic valve:  Trileaflet; mildly calcified leaflets. Doppler: There was no stenosis.  There was trivial regurgitation.  ------------------------------------------------------------------- Aorta: Mildly dilated aortic root.  ------------------------------------------------------------------- Mitral valve:  Mildly calcified annulus. Mildly calcified leaflets . Doppler:  There was no evidence for stenosis.  There was mild regurgitation.  ------------------------------------------------------------------- Left atrium: The atrium was mildly dilated.  ------------------------------------------------------------------- Right ventricle: Poorly visualized.  ------------------------------------------------------------------- Pulmonic valve:  Structurally normal valve.  Cusp separation was normal. Doppler: Transvalvular velocity was within the normal range. There was trivial regurgitation.  ------------------------------------------------------------------- Tricuspid valve: Poorly visualized.  ------------------------------------------------------------------- Pulmonary artery:  No complete TR doppler jet so unable to estimate PA systolic pressure.  ------------------------------------------------------------------- Right atrium: Poorly visualized.  ------------------------------------------------------------------- Pericardium: A trivial pericardial effusion was identified posterior to the heart.  ------------------------------------------------------------------- Systemic veins: IVC not visualized.  ------------------------------------------------------------------- Post procedure conclusions Ascending Aorta:  -  Mildly dilated aortic root.  ------------------------------------------------------------------- Measurements  Left ventricle             Value    Reference LV ID, ED, PLAX chordal    (H)   58.5 mm   43 - 52 LV ID, ES, PLAX chordal    (H)   39.3 mm   23 - 38 LV fx shortening, PLAX chordal     33  %   >=29 LV PW thickness, ED          9.44 mm   --------- IVS/LV PW ratio, ED          1.13     <=1.3 LV e&', lateral             8.22 cm/s  --------- LV E/e&', lateral            7.57     --------- LV e&', medial             5.48 cm/s  --------- LV E/e&', medial            11.35    --------- LV e&', average             6.85 cm/s  --------- LV E/e&', average            9.08     ---------  Ventricular septum           Value    Reference IVS thickness, ED           10.7 mm   ---------  Aorta                 Value    Reference Aortic root ID, ED  39  mm   ---------  Left atrium              Value    Reference LA ID, A-P, ES             41  mm   --------- LA ID/bsa, A-P             1.63 cm/m^2 <=2.2 LA volume/bsa, S            28  ml/m^2 ---------  Mitral valve              Value    Reference Mitral E-wave peak velocity      62.2 cm/s  --------- Mitral A-wave peak velocity      79  cm/s  --------- Mitral deceleration time        151  ms   150 - 230 Mitral E/A ratio, peak         0.8     ---------  Systemic veins             Value    Reference Estimated CVP             8   mm Hg ---------  Pulmonic valve             Value    Reference Pulmonic regurg velocity, ED       66.4 cm/s  ---------  Legend: (L) and (H) mark values outside specified reference range.  ------------------------------------------------------------------- Prepared and Electronically Authenticated by  Loralie Champagne, M.D. 2015-09-17T15:15:29       Assessment / Plan: 1. Coronary disease with remote myocardial infarction. Status post CABG in 2004. Last Myoview showed no ischemia. Continue with medical management. Continue current therapy. Follow up in 6 months.  2. Hypertension, well controlled.  3. Hyperlipidemia. Continue statin.   4. Glioblastoma. S/p resection. He has completed RT and is continuing on chemotherapy.

## 2014-10-29 ENCOUNTER — Other Ambulatory Visit: Payer: Self-pay | Admitting: *Deleted

## 2014-10-29 DIAGNOSIS — C719 Malignant neoplasm of brain, unspecified: Secondary | ICD-10-CM

## 2014-10-29 MED ORDER — TEMOZOLOMIDE 180 MG PO CAPS: 360.0000 mg | ORAL_CAPSULE | Freq: Every day | ORAL | Status: DC

## 2014-10-29 NOTE — Telephone Encounter (Signed)
Wife left VM asking if OK for Brenen to visit home of toddler (77yo) with hand foot mouth disease on Thanksgiving? Called back and left VM that child is most contagious the 1st week. Should be OK to visit home, but avoid direct contact with the child-sputum,saliva,stool,urine, close contact (cough/sneezing). Use good handwashing and would be best to wear gloves.

## 2014-11-03 ENCOUNTER — Encounter: Payer: Self-pay | Admitting: Radiation Oncology

## 2014-11-03 NOTE — Progress Notes (Signed)
  Radiation Oncology         [phone] ________________________________  Name: [name] MRN: [id number]  Date: 11/03/2014  DOB: 1935/07/15  End of Treatment Note  Diagnosis:     Glioblastoma presenting in the right temporal brain  Indication for treatment:  Post op     Radiation treatment dates: October 8 through November 18    Site/dose:   Right temporal lobe 60 gray in 30 fraction  Beams/energy:   Helical intensity modulated radiation therapy, 6 megavoltage photons  Narrative: The patient tolerated radiation treatment relatively well.   His confusion improved during the course of treatment. He had minimal fatigue and minimal scalp irritation in the course of his treatment.  Plan: The patient has completed radiation treatment. The patient will return to radiation oncology clinic for routine followup in one month. I advised them to call or return sooner if they have any questions or concerns related to their recovery or treatment.  -----------------------------------  Todd Promise, PhD, MD

## 2014-11-04 ENCOUNTER — Telehealth: Payer: Self-pay | Admitting: *Deleted

## 2014-11-04 NOTE — Telephone Encounter (Signed)
Called the patient wife because she had inquired about why her husband had not been placed on CPAP.  It is unclear what occurred in the past regarding the patient not being set up on CPAP.  She states that an order was initially sent to [facility] and then it was canceled per Mckinley Jewel.  I find no record of a canceled CPAP order nor is there documentation to support it.  The patient's wife was instructed that her husbands information would be referred to [place] and a representative from their office would contact them for CPAP set up.   Patient instructed to contact our office 6-8 weeks post set up to schedule a follow up appointment.

## 2014-11-04 NOTE — Telephone Encounter (Signed)
Please make sure the order has been send to [facility]-

## 2014-11-10 ENCOUNTER — Other Ambulatory Visit: Payer: Self-pay | Admitting: Cardiology

## 2014-11-12 ENCOUNTER — Telehealth: Payer: Self-pay | Admitting: *Deleted

## 2014-11-12 NOTE — Telephone Encounter (Signed)
Pt's wife called asking "he has completed his radiation/Temodar; who is going to order the MRI and does he need it before he starts again on his Temodar 12/14?"   Note to Dr. Benay Spice.

## 2014-11-12 NOTE — Telephone Encounter (Signed)
Per Dr. Benay Spice; notified pt's wife that Dr. Benay Spice & Dr. Sondra Come have communicated and Dr. Benay Spice will order scan after he sees pt 12/10/14.  Pt's wife verbalized understanding of information.

## 2014-11-19 ENCOUNTER — Telehealth: Payer: Self-pay | Admitting: *Deleted

## 2014-11-19 NOTE — Telephone Encounter (Signed)
Wife called to report she only gave Nasier 180 mg of Temodar on Monday instead of the 360 mg. Due to finish on 1218 pm, but will have one extra pill left. Asking what to do? Instructed her to give the last 180 mg capsule on 12/19 pm.

## 2014-11-24 ENCOUNTER — Encounter: Payer: Self-pay | Admitting: Oncology

## 2014-11-27 ENCOUNTER — Ambulatory Visit
Admission: RE | Admit: 2014-11-27 | Discharge: 2014-11-27 | Disposition: A | Discharge status: Home / Self Care | Payer: Medicare Other | Source: Ambulatory Visit | Attending: Radiation Oncology | Admitting: Radiation Oncology

## 2014-11-27 ENCOUNTER — Encounter: Payer: Self-pay | Admitting: Radiation Oncology

## 2014-11-27 VITALS — BP 120/78 | HR 70 | Temp 98.5°F | Ht 74.0 in | Wt 248.4 lb

## 2014-11-27 DIAGNOSIS — C719 Malignant neoplasm of brain, unspecified: Secondary | ICD-10-CM

## 2014-11-27 NOTE — Progress Notes (Signed)
Radiation Oncology         [phone] ________________________________  Name: [name] MRN: [id number]  Date: 11/27/2014  DOB: 04-02-1935  Follow-Up Visit Note  CC: Jerlyn Ly, MD  Newman Pies, MD    ICD-9-CM ICD-10-CM   1. Glioblastoma multiforme of brain 191.9 C71.9       Interval Since Last Radiation:  1  months  Narrative:  The patient returns today for routine follow-up.  He continues to have a fair amount of fatigue and does sleep a fair amount. He denies any nausea or headaches. Patient has started on additional Temodar and seems to be tolerating this well. He is scheduled to be seen by medical oncology in early January.  He is ambulating  without the assistance of a walker or cane at this time.                            ALLERGIES:  has No Known Allergies.  Meds: Current Outpatient Prescriptions  Medication Sig Dispense Refill  . Cholecalciferol (VITAMIN D-3 PO) Take 1,000 Units by mouth daily.     Marland Kitchen docusate sodium 100 MG CAPS Take 100 mg by mouth 2 (two) times daily. 60 capsule 0  . levETIRAcetam (KEPPRA) 500 MG tablet Take 1 tablet (500 mg total) by mouth 2 (two) times daily. 60 tablet 1  . metoprolol tartrate (LOPRESSOR) 25 MG tablet TAKE 1 TABLET BY MOUTH TWICE DAILY 60 tablet 0  . nitroGLYCERIN (NITROSTAT) 0.4 MG SL tablet Place 1 tablet (0.4 mg total) under the tongue every 5 (five) minutes as needed. 25 tablet 6  . ondansetron (ZOFRAN) 8 MG tablet Take 1 tablet (8 mg total) by mouth as directed. Take one tablet 1 hour prior to Temodar dose and every 12 hours if needed for nausea 60 tablet 1  . simvastatin (ZOCOR) 40 MG tablet   0  . sulfamethoxazole-trimethoprim (BACTRIM DS) 800-160 MG per tablet Take 1 tablet by mouth 3 (three) times a week. On Monday-Wednesday-Friday for 3 months 36 tablet 0  . dexamethasone (DECADRON) 2 MG tablet Take 2 mg by mouth daily.    . niacin (NIASPAN) 1000 MG CR tablet Take 1,000 mg by mouth at bedtime.      .  polyethylene glycol (MIRALAX / GLYCOLAX) packet Take 17 g by mouth daily.    Marland Kitchen temozolomide (TEMODAR) 180 MG capsule Take 2 capsules (360 mg total) by mouth daily. May take on an empty stomach or at bedtime to decrease nausea & vomiting.  Take X 5 days-repeat cycle every 28 days (Patient not taking: Reported on 11/27/2014) 10 capsule 0   No current facility-administered medications for this encounter.    Physical Findings: The patient is in no acute distress.   height is 6\' 2"  (1.88 m) and weight is 248 lb 6.4 oz (112.674 kg). His oral temperature is 98.5 F (36.9 C). His blood pressure is 120/78 and his pulse is 70. His oxygen saturation is 94%. .  Patient occasionally will exhibit some mild confusion. The scalp is well-healed. The oral cavity is free of secondary infection. On neurological examination motor strength is 5 out of 5 in the proximal and distal muscle groups of the upper lower extremities.  Lab Findings: Lab Results  Component Value Date   WBC 7.4 10/23/2014   HGB 13.0 10/23/2014   HCT 39.6 10/23/2014   MCV 90.8 10/23/2014   PLT 122* 10/23/2014    Radiographic Findings: No  results found.  Impression:  The patient is recovering from the effects of radiation.    Plan:  Patient will continue on Temodar. He continues on Keppra. When necessary follow-up in radiation oncology in light of the patient's close follow-up with medical oncology.  ____________________________________ Blair Promise, MD

## 2014-11-27 NOTE — Progress Notes (Signed)
[name] here for follow up after treatment to his right temporal brain.  He reports having occasional headaches which he thinks is due to his CPAP mask.  He reports he has notice having more trouble reading.  He denies dizziness, nausea and balance issues.  He is using a cane when walking.  He reports a poor appetite and has lost 10 lbs since 10/24/14.  He reports having some memory issues, especially with keeping track of time.  He reports feeling "sleepy" and his wife reports that he is not as active.  He is not taking decadron.  He continue to take [place].  His has some small scabbed areas on his scalp.  He continues to use biafine.  He is not currently taking temodar.

## 2014-12-01 ENCOUNTER — Telehealth: Payer: Self-pay | Admitting: Nutrition

## 2014-12-01 NOTE — Telephone Encounter (Signed)
Patient was identified to be at risk for malnutrition on the MST secondary to poor appetite and weight loss.   Contacted patient by phone.  However, he was not available. Left my name and phone number in case patient wished to contact me.

## 2014-12-03 ENCOUNTER — Telehealth: Payer: Self-pay | Admitting: Nutrition

## 2014-12-03 NOTE — Telephone Encounter (Signed)
Contacted patient by telephone and spoke with patient and wife over speakerphone. Patient has poor appetite.  He is eating less than before.   Last weight was documented as 224 pounds and wife reports patient has lost 30 pounds overall. Recommended patient try to increase snacks between meals and try samples of ensure or boost.   Patient agreeable to receiving fact sheets and samples. Will mail fact sheets and provide samples for patient to pick up at next visit.  Contact information will be given.  **Disclaimer: This note was dictated with voice recognition software. Similar sounding words can inadvertently be transcribed and this note may contain transcription errors which may not have been corrected upon publication of note.**

## 2014-12-08 ENCOUNTER — Other Ambulatory Visit: Payer: Self-pay | Admitting: Cardiology

## 2014-12-09 ENCOUNTER — Encounter: Payer: Self-pay | Admitting: Neurology

## 2014-12-10 ENCOUNTER — Other Ambulatory Visit (HOSPITAL_BASED_OUTPATIENT_CLINIC_OR_DEPARTMENT_OTHER): Payer: Medicare Other

## 2014-12-10 ENCOUNTER — Ambulatory Visit (HOSPITAL_BASED_OUTPATIENT_CLINIC_OR_DEPARTMENT_OTHER): Payer: Medicare Other | Admitting: Oncology

## 2014-12-10 ENCOUNTER — Telehealth: Payer: Self-pay | Admitting: Oncology

## 2014-12-10 VITALS — BP 105/64 | HR 61 | Temp 97.5°F | Resp 18 | Ht 74.0 in | Wt 243.7 lb

## 2014-12-10 DIAGNOSIS — I83891 Varicose veins of right lower extremities with other complications: Secondary | ICD-10-CM

## 2014-12-10 DIAGNOSIS — R4182 Altered mental status, unspecified: Secondary | ICD-10-CM

## 2014-12-10 DIAGNOSIS — C719 Malignant neoplasm of brain, unspecified: Secondary | ICD-10-CM

## 2014-12-10 DIAGNOSIS — C712 Malignant neoplasm of temporal lobe: Secondary | ICD-10-CM

## 2014-12-10 LAB — CBC WITH DIFFERENTIAL/PLATELET
BASO%: 2.4 % — ABNORMAL HIGH (ref 0.0–2.0)
Basophils Absolute: 0.1 10*3/uL (ref 0.0–0.1)
EOS%: 4.2 % (ref 0.0–7.0)
Eosinophils Absolute: 0.2 10*3/uL (ref 0.0–0.5)
HCT: 37.4 % — ABNORMAL LOW (ref 38.4–49.9)
HEMOGLOBIN: 12.2 g/dL — AB (ref 13.0–17.1)
LYMPH#: 0.7 10*3/uL — AB (ref 0.9–3.3)
LYMPH%: 12.3 % — AB (ref 14.0–49.0)
MCH: 29.9 pg (ref 27.2–33.4)
MCHC: 32.6 g/dL (ref 32.0–36.0)
MCV: 91.8 fL (ref 79.3–98.0)
MONO#: 0.8 10*3/uL (ref 0.1–0.9)
MONO%: 14.8 % — ABNORMAL HIGH (ref 0.0–14.0)
NEUT%: 66.3 % (ref 39.0–75.0)
NEUTROS ABS: 3.7 10*3/uL (ref 1.5–6.5)
Platelets: 254 10*3/uL (ref 140–400)
RBC: 4.08 10*6/uL — AB (ref 4.20–5.82)
RDW: 15.8 % — ABNORMAL HIGH (ref 11.0–14.6)
WBC: 5.6 10*3/uL (ref 4.0–10.3)

## 2014-12-10 LAB — COMPREHENSIVE METABOLIC PANEL (CC13)
ALK PHOS: 62 U/L (ref 40–150)
ALT: 8 U/L (ref 0–55)
ANION GAP: 7 meq/L (ref 3–11)
AST: 13 U/L (ref 5–34)
Albumin: 3.4 g/dL — ABNORMAL LOW (ref 3.5–5.0)
BILIRUBIN TOTAL: 0.62 mg/dL (ref 0.20–1.20)
BUN: 13.7 mg/dL (ref 7.0–26.0)
CHLORIDE: 108 meq/L (ref 98–109)
CO2: 27 mEq/L (ref 22–29)
Calcium: 9 mg/dL (ref 8.4–10.4)
Creatinine: 1.1 mg/dL (ref 0.7–1.3)
EGFR: 65 mL/min/{1.73_m2} — AB (ref >90)
Glucose: 83 mg/dl (ref 70–140)
POTASSIUM: 3.9 meq/L (ref 3.5–5.1)
SODIUM: 142 meq/L (ref 136–145)
Total Protein: 5.5 g/dL — ABNORMAL LOW (ref 6.4–8.3)

## 2014-12-10 NOTE — Telephone Encounter (Signed)
Gave avs & cal for Feb. °

## 2014-12-10 NOTE — Progress Notes (Signed)
  [facility] OFFICE PROGRESS NOTE   Diagnosis: GBM  INTERVAL HISTORY:   [name] returns as scheduled. He completed a cycle of temozolomide beginning 11/17/2014. He reports tolerating the temozolomide well. No nausea. He continues to have memory loss. He reports Dr. [name] is discontinuing the [place]. He noted transient pain in the bilateral upper legs last week.  Objective:  Vital signs in last 24 hours:  Blood pressure 105/64, pulse 61, temperature 97.5 F (36.4 C), temperature source Oral, resp. rate 18, height 6' 2" (1.88 m), weight 243 lb 11.2 oz (110.542 kg), SpO2 100 %.    HEENT: No thrush or ulcers Resp: Inspiratory rhonchi at the bases bilaterally, no respiratory distress Cardio: Regular rate and rhythm GI: No hepatomegaly, nontender Vascular: Trace edema at the left greater the right lower leg with chronic stasis change Neuro: Alert, follows commands, the motor exam appears intact in the upper and lower extremities    Lab Results:  Lab Results  Component Value Date   WBC 5.6 12/10/2014   HGB 12.2* 12/10/2014   HCT 37.4* 12/10/2014   MCV 91.8 12/10/2014   PLT 254 12/10/2014   NEUTROABS 3.7 12/10/2014    Medications: I have reviewed the patient's current medications.  Assessment/Plan: 1. Glioblastoma Multiforme, status post resection of a right temporal mass 08/20/2014   MGMT methylation not detected  Negative for IDH1 and IDH2 mutations  Initiation of adjuvant temozolomide and radiation 09/11/2014; completion of radiation 10/22/2014.  Cycle 1 adjuvant temozolomide 11/17/2014 2. Altered mental status secondary to #1  3. History of coronary artery disease  4. 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 Sleep apnea   Disposition:  [name] appears stable. He tolerated the first cycle of adjuvant temozolomide well. The plan is to proceed with cycle 2 on 12/15/2014. He will return for an office and  lab visit 01/08/2015.         Betsy Coder, MD  12/10/2014  9:40 AM

## 2014-12-11 ENCOUNTER — Other Ambulatory Visit: Payer: Self-pay | Admitting: *Deleted

## 2014-12-11 DIAGNOSIS — C719 Malignant neoplasm of brain, unspecified: Secondary | ICD-10-CM

## 2014-12-11 MED ORDER — TEMOZOLOMIDE 180 MG PO CAPS: 360.0000 mg | ORAL_CAPSULE | Freq: Every day | ORAL | Status: DC

## 2014-12-22 ENCOUNTER — Telehealth: Payer: Self-pay | Admitting: *Deleted

## 2014-12-22 NOTE — Telephone Encounter (Signed)
Spoke with patient/wife. He denies nausea today and is able to drink fluids easily-says he can drink #6-8 8 ounce glasses of fluid daily. Wife agrees to give him small snack every 2-3 hours while awake. No fever, just feels "tired". Made him aware this is an expected side effect of having the chemo and should improve in next few days. Call if not better and follow up on 01/08/15 as scheduled.

## 2014-12-22 NOTE — Telephone Encounter (Signed)
VM that [name] took last Temodar dose of 360 mg on 12/12/14. Had temp 100.0 on 12/12/14-gave Tylenol. Temp is now normal. Reports "feeling bad" last couple days. Not eating or drinking much. Next OV 01/08/15

## 2015-01-01 ENCOUNTER — Encounter: Payer: Self-pay | Admitting: Neurology

## 2015-01-02 ENCOUNTER — Ambulatory Visit (INDEPENDENT_AMBULATORY_CARE_PROVIDER_SITE_OTHER): Payer: Medicare Other | Admitting: Neurology

## 2015-01-02 ENCOUNTER — Encounter: Payer: Self-pay | Admitting: Neurology

## 2015-01-02 VITALS — BP 110/66 | HR 50 | Resp 14

## 2015-01-02 DIAGNOSIS — G4733 Obstructive sleep apnea (adult) (pediatric): Secondary | ICD-10-CM

## 2015-01-02 DIAGNOSIS — Z9989 Dependence on other enabling machines and devices: Principal | ICD-10-CM

## 2015-01-02 NOTE — Progress Notes (Signed)
[facility]  Provider:  Larey Seat, M D  Referring Provider: Jerlyn Ly, MD Primary Care Physician:  Jerlyn Ly, MD  Chief Complaint  Patient presents with  . RV cpap    Rm 11,wife    HPI:  [name] is a 80 y.o. caucasian , right handed, married  male, who is seen here due to  referral from Dr. Joylene Draft for a sleep consultation.   [name] reports today that he was 12 years ago evaluated for sleep apnea at sleep med  in [place], [place]. At the time, June 2003,  he was diagnosed with obstructive sleep apnea and he was asked to return for CPAP titration. Since that time he has to do with the same machine a ResMed model S7. The CPAP of that age  has no data  download capacity. Recently he had decided to see if that makes any difference to his well-being and daytime alertness if using  the machine or not -  he reports that he could not find a difference after a night of using CPAP versus not using CPAP.  His wife stated he was not snoring any longer on CPAP , but now does " awfully loud ". Last 12 years there have been some changes in his medical status and he has lost about 35 pounds. He had a myocardial infarction in 1990 and Dr .Martinique has kept him on ASA, now 81 mg.  In  2004 he underwent cardiac bypass surgery, and a couple of months later suffered another milder heart attack and had to be undergoing angioplasty and stent .  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 The patient likes to use his computer at late evenings and has a TV running in the background. He may sometimes fall asleep. The patient goes to bed at midnight , he promptly falls asleep, he goes 3 times to the bathroom, he has a dry mouth than, too. No headaches,  Regularly wakes up at 7 AM spontaneously, no alarm.  Once a week he will take a 15-45  minute nap, on sunday.  The bedroom is  dark, cool and quiet. He sleeps alone , his wife can hear him next door, snoring.     Interval history from 1-20 9-16 [name] is seen here today for a follow-up after a recent split-night polysomnography.  On 05-05-14 , the patient was found to have apnea to a moderate severity at an AHI of 25.9 and an RDI of 32.7. REM sleep was not noted the patient slept part of the night in the supine position and all apneas occurred in supine sleep. Was titrated to CPAP at 8 cm water he had occasional periodic limb movement arousals, but overall he seemed to have done well with the CPAP. In the meantime he has used CPAP and has a 90% compliance for 27 out of 30 days on a tracing obtained today. We reviewed that here and his average daily use is 7 hours and 11 minutes and his residual AHI is 4.9 he does have significant air leaks so sometimes the mask is not well attached however his apnea index is much decreased in comparison to his baseline study.  Unfortunately [name] was diagnosed with a glioblastoma last year and underwent surgery and chemotherapy. At the end of August 2015 he was diagnosed with a glioblastoma after complaining that  he didn't "feel right" there was no initiation seizure and there were no strokelike symptoms cognitively he felt a little slower. Dr. Joylene Draft ordered the MRI of the brain,  which identified the tumor in the right hemisphere . Tumor surgery took place on 08/19/2014. The surgeon is Dr. Earle Gell. The patient endorsed today the fatigue severity score at 34 points and the Epworth sleepiness score at 4 points. He has been followed by physical therapy with [name] in [place], and he has a remarkable good left-sided grip strength and possibly just a small left lower face weakness. He attended some pool therapy. He is pain free.  " Review of Systems: Out of a complete 14 system review, the patient complains of only the following symptoms, and all other reviewed systems are  negative. Loud snoring - when not on CPAP. The patient endorsed today the fatigue severity score at 34 points and the Epworth sleepiness score at 4 points.   History   Social History  . Marital Status: Married    Spouse Name: [name]    Number of Children: 3  . Years of Education: College   Occupational History  . [employer] retired    Social History Main Topics  . Smoking status: Never Smoker   . Smokeless tobacco: Never Used  . Alcohol Use: No  . Drug Use: No  . Sexual Activity: Not on file   Other Topics Concern  . Not on file   Social History Narrative   Patient is married [name]).   Patient has three children.   Patient has a college education.   Patient is retired from [employer]   Patient drinks very little caffeine.   Patient is right-handed.   Hobbies: "Reading the Bible", SUDU[employer] puzzles, active in church             Family History  Problem Relation Age of Onset  . Cancer Sister   . Heart disease    . Diabetes Brother     Past Medical History  Diagnosis Date  . Coronary artery disease   . MI, old     INFERIOR  . Hypertension   . Hyperlipidemia   . Chronic venous insufficiency     and varicose veins  . Anterior myocardial infarction 1990    with subsequent PTCA  . Hypercholesterolemia   . Ulcer of right leg   . Obstructive sleep apnea   . Anemia   . Obesity   . Radiation 09/11/14-10/22/14    right temperol lobe 60 gray    Past Surgical History  Procedure Laterality Date  . Cardiac catheterization  06/30/2003    EF 50%  . Coronary artery bypass graft  01/2003    X6. LIMA GRAFT TO THE LDA, FREE RADICAL GRAFT TO THE OM1, SEQUENTIAL SAPHENOUS VEIN GRAFT TO THE FIRST AND SECOND DIAGONAL BRANCES, AND A SEQUENTIAL  VEIN GRAFT TO THE PDA AND POSTERIOR LATERAL BRANCHES OF THE RIGHT CORONARY  . Coronary angioplasty  06/2003    POSTERIOR LATERAL BRANCH  . Tonsillectomy and adenoidectomy    . Cardiovascular stress test  01/23/2009    EF 44%  . Craniotomy Right  08/20/2014    Procedure: CRANIOTOMY TUMOR EXCISION w/ BrainLab;  Surgeon: Newman Pies, MD;  Location: [facility];  Service: Neurosurgery;  Laterality: Right;  Right Craniotomy with brain lab for tumor    Current Outpatient Prescriptions  Medication Sig Dispense Refill  . Cholecalciferol (VITAMIN D-3 PO) Take 1,000 Units by mouth daily.     Marland Kitchen  docusate sodium 100 MG CAPS Take 100 mg by mouth 2 (two) times daily. (Patient not taking: Reported on 12/10/2014) 60 capsule 0  . levETIRAcetam (KEPPRA) 500 MG tablet Take 1 tablet (500 mg total) by mouth 2 (two) times daily. 60 tablet 1  . metoprolol tartrate (LOPRESSOR) 25 MG tablet TAKE 1 TABLET BY MOUTH TWICE DAILY 60 tablet 5  . nitroGLYCERIN (NITROSTAT) 0.4 MG SL tablet Place 1 tablet (0.4 mg total) under the tongue every 5 (five) minutes as needed. 25 tablet 6  . ondansetron (ZOFRAN) 8 MG tablet Take 1 tablet (8 mg total) by mouth as directed. Take one tablet 1 hour prior to Temodar dose and every 12 hours if needed for nausea 60 tablet 1  . polyethylene glycol (MIRALAX / GLYCOLAX) packet Take 17 g by mouth daily.    . simvastatin (ZOCOR) 40 MG tablet   0  . sulfamethoxazole-trimethoprim (BACTRIM DS) 800-160 MG per tablet Take 1 tablet by mouth 3 (three) times a week. On Monday-Wednesday-Friday for 3 months 36 tablet 0  . temozolomide (TEMODAR) 180 MG capsule Take 2 capsules (360 mg total) by mouth daily. May take on an empty stomach or at bedtime to decrease nausea & vomiting.  Take X 5 days-repeat cycle every 28 days 10 capsule 0   No current facility-administered medications for this visit.    Allergies as of 01/02/2015  . (No Known Allergies)    Vitals: There were no vitals taken for this visit. Last Weight:  Wt Readings from Last 1 Encounters:  12/10/14 243 lb 11.2 oz (110.542 kg)   Last Height:   Ht Readings from Last 1 Encounters:  12/10/14 6\' 2"  (1.88 m)    Physical exam:  General: The patient is awake, alert and appears  not in acute distress. The patient is well groomed. Head: Normocephalic, atraumatic. Neck is supple. Mallampati 3, very elongated uvula,  No-nasal obstruction, but nasal septal deviation.  No rhinitis. neck circumference: 19 inches. Cardiovascular:  Regular rate and rhythm, without murmurs or carotid bruit, and without distended neck veins. Respiratory: Lungs are clear to auscultation. Skin:  Without evidence of edema, or rash Trunk: BMI is elevated,  patient  has normal posture.  Neurologic exam : The patient is awake and alert, oriented to place and time. Memory subjective  described as intact.  There is a normal attention span & concentration ability. Speech is fluent without  dysarthria, dysphonia or aphasia. Mood and affect are appropriate.  Cranial nerves: Pupils are equal and briskly reactive to light. Funduscopic exam without  evidence of pallor or edema.  Extraocular movements  in vertical and horizontal planes intact and without nystagmus. Visual fields by finger perimetry are intact. Hearing to finger rub intact.  Facial sensation intact to fine touch. Facial motor strength left droop.  Motor exam:   Normal tone and normal muscle bulk , no cogwheeling , symmetric normal strength in all extremities.  Sensory:  Fine touch,  he has less vibration sense below the left ankle.  Coordination:Finger-to-nose maneuver tested and normal without evidence of ataxia, dysmetria but now wit symmetric tremor.   Gait and station: Patient walks without assistive device . Deep tendon reflexes: in the  upper and lower extremities are symmetric and intact. Babinski maneuver response is downgoing.   Assessment:  After physical and neurologic examination, review of laboratory studies, imaging, neurophysiology testing and pre-existing records, assessment is   OSA with an AHI around 28, only in supine. CPAP was  the only applicable  treatment based on cardiac history.  Positional therapy was repeated.   Dry mouth - biotin mouth wash.  2 times average  nocturia.  CPAP pillow recommended.   Plan:  Treatment plan and additional workup : NP-  Rv in 12 month for CPAP follow up.   Dr. [name]. :The brain malignancy is a glioblastoma and will likely progress rapidly- the patient nad wife are aware. Marland Kitchen

## 2015-01-08 ENCOUNTER — Other Ambulatory Visit (HOSPITAL_BASED_OUTPATIENT_CLINIC_OR_DEPARTMENT_OTHER): Payer: Medicare Other

## 2015-01-08 ENCOUNTER — Telehealth: Payer: Self-pay | Admitting: Oncology

## 2015-01-08 ENCOUNTER — Ambulatory Visit (HOSPITAL_BASED_OUTPATIENT_CLINIC_OR_DEPARTMENT_OTHER): Payer: Medicare Other | Admitting: Nurse Practitioner

## 2015-01-08 ENCOUNTER — Other Ambulatory Visit: Payer: Self-pay

## 2015-01-08 VITALS — BP 109/63 | HR 49 | Temp 98.2°F | Resp 18 | Ht 74.0 in | Wt 240.1 lb

## 2015-01-08 DIAGNOSIS — C712 Malignant neoplasm of temporal lobe: Secondary | ICD-10-CM

## 2015-01-08 DIAGNOSIS — C719 Malignant neoplasm of brain, unspecified: Secondary | ICD-10-CM

## 2015-01-08 DIAGNOSIS — I2510 Atherosclerotic heart disease of native coronary artery without angina pectoris: Secondary | ICD-10-CM

## 2015-01-08 DIAGNOSIS — R4182 Altered mental status, unspecified: Secondary | ICD-10-CM

## 2015-01-08 LAB — CBC WITH DIFFERENTIAL/PLATELET
BASO%: 1.8 % (ref 0.0–2.0)
BASOS ABS: 0.1 10*3/uL (ref 0.0–0.1)
EOS ABS: 0.3 10*3/uL (ref 0.0–0.5)
EOS%: 5.3 % (ref 0.0–7.0)
HCT: 37.7 % — ABNORMAL LOW (ref 38.4–49.9)
HGB: 12.6 g/dL — ABNORMAL LOW (ref 13.0–17.1)
LYMPH#: 0.6 10*3/uL — AB (ref 0.9–3.3)
LYMPH%: 12.6 % — ABNORMAL LOW (ref 14.0–49.0)
MCH: 30.6 pg (ref 27.2–33.4)
MCHC: 33.4 g/dL (ref 32.0–36.0)
MCV: 91.5 fL (ref 79.3–98.0)
MONO#: 0.7 10*3/uL (ref 0.1–0.9)
MONO%: 14.7 % — ABNORMAL HIGH (ref 0.0–14.0)
NEUT#: 3.2 10*3/uL (ref 1.5–6.5)
NEUT%: 65.6 % (ref 39.0–75.0)
Platelets: 161 10*3/uL (ref 140–400)
RBC: 4.12 10*6/uL — ABNORMAL LOW (ref 4.20–5.82)
RDW: 14.3 % (ref 11.0–14.6)
WBC: 4.9 10*3/uL (ref 4.0–10.3)

## 2015-01-08 LAB — COMPREHENSIVE METABOLIC PANEL (CC13)
ALK PHOS: 50 U/L (ref 40–150)
ALT: 7 U/L (ref 0–55)
ANION GAP: 7 meq/L (ref 3–11)
AST: 11 U/L (ref 5–34)
Albumin: 3.4 g/dL — ABNORMAL LOW (ref 3.5–5.0)
BILIRUBIN TOTAL: 0.53 mg/dL (ref 0.20–1.20)
BUN: 11.6 mg/dL (ref 7.0–26.0)
CALCIUM: 8.9 mg/dL (ref 8.4–10.4)
CHLORIDE: 110 meq/L — AB (ref 98–109)
CO2: 25 mEq/L (ref 22–29)
Creatinine: 1.1 mg/dL (ref 0.7–1.3)
EGFR: 65 mL/min/{1.73_m2} — AB (ref >90)
GLUCOSE: 92 mg/dL (ref 70–140)
POTASSIUM: 4.1 meq/L (ref 3.5–5.1)
Sodium: 142 mEq/L (ref 136–145)
Total Protein: 5.3 g/dL — ABNORMAL LOW (ref 6.4–8.3)

## 2015-01-08 MED ORDER — TEMOZOLOMIDE 180 MG PO CAPS: 360.0000 mg | ORAL_CAPSULE | Freq: Every day | ORAL | Status: DC

## 2015-01-08 NOTE — Progress Notes (Signed)
  [place] OFFICE PROGRESS NOTE   Diagnosis:  GBM  INTERVAL HISTORY:   [name] returns as scheduled. He completed cycle 2 temozolomide beginning 12/15/2014. No nausea or vomiting around the time of chemotherapy. No mouth sores. No diarrhea. He notes constipation with the chemotherapy. He takes a laxative as needed. No seizures. He denies any headaches. He intermittently has "achy" lower leg discomfort. This occurs mainly at nighttime.  Objective:  Vital signs in last 24 hours:  Blood pressure 109/63, pulse 49, temperature 98.2 F (36.8 C), temperature source Oral, resp. rate 18, height $RemoveBe'6\' 2"'KdOhhVUbS$  (1.88 m), weight 240 lb 1.6 oz (108.909 kg), SpO2 99 %.    HEENT: No thrush or ulcers. Resp: Rales at the lung bases. No respiratory distress. Cardio: Regular rate and rhythm. GI: Abdomen soft and nontender. No hepatomegaly. Vascular: Trace edema at the lower legs bilaterally left greater than right. Neuro: Alert. Follows commands. Motor strength 5 over 5. Skin: No rash.    Lab Results:  Lab Results  Component Value Date   WBC 4.9 01/08/2015   HGB 12.6* 01/08/2015   HCT 37.7* 01/08/2015   MCV 91.5 01/08/2015   PLT 161 01/08/2015   NEUTROABS 3.2 01/08/2015    Imaging:  No results found.  Medications: I have reviewed the patient's current medications.  Assessment/Plan: 1. Glioblastoma Multiforme, status post resection of a right temporal mass 08/20/2014   MGMT methylation not detected  Negative for IDH1 and IDH2 mutations  Initiation of adjuvant temozolomide and radiation 09/11/2014; completion of radiation 10/22/2014.  Cycle 1 adjuvant temozolomide 11/17/2014  Cycle 2 temozolomide 12/15/2014 2. Altered mental status secondary to #1  3. History of coronary artery disease  4. 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 Sleep apnea    Disposition: Mr. Watling appears stable. He has completed 2 cycles of adjuvant  temozolomide. Plan to proceed with cycle 3 as scheduled beginning 01/12/2015.   He will return for a follow-up visit in approximately 4 weeks. We will obtain a follow-up MRI of the brain 1 week prior to that visit. He will contact the office in the interim with any problems.  Plan reviewed with Dr. Benay Spice.    Ned Card ANP/GNP-BC   01/08/2015  10:34 AM

## 2015-01-08 NOTE — Telephone Encounter (Signed)
Pt confirmed labs/ov per 02/04 POF, gave pt AVS.... KJ °

## 2015-01-29 ENCOUNTER — Ambulatory Visit (HOSPITAL_COMMUNITY)
Admission: RE | Admit: 2015-01-29 | Discharge: 2015-01-29 | Disposition: A | Discharge status: Home / Self Care | Payer: Medicare Other | Source: Ambulatory Visit | Attending: Nurse Practitioner | Admitting: Nurse Practitioner

## 2015-01-29 DIAGNOSIS — Z923 Personal history of irradiation: Secondary | ICD-10-CM | POA: Insufficient documentation

## 2015-01-29 DIAGNOSIS — C719 Malignant neoplasm of brain, unspecified: Secondary | ICD-10-CM | POA: Diagnosis not present

## 2015-01-29 DIAGNOSIS — R410 Disorientation, unspecified: Secondary | ICD-10-CM | POA: Insufficient documentation

## 2015-01-29 DIAGNOSIS — Z9889 Other specified postprocedural states: Secondary | ICD-10-CM | POA: Diagnosis not present

## 2015-01-29 MED ORDER — GADOBENATE DIMEGLUMINE 529 MG/ML IV SOLN
20.0000 mL | Freq: Once | INTRAVENOUS | Status: AC | PRN
  Administered 2015-01-29: 20 mL via INTRAVENOUS

## 2015-02-03 ENCOUNTER — Ambulatory Visit (INDEPENDENT_AMBULATORY_CARE_PROVIDER_SITE_OTHER): Payer: Medicare Other | Admitting: Podiatry

## 2015-02-03 ENCOUNTER — Encounter: Payer: Self-pay | Admitting: Podiatry

## 2015-02-03 VITALS — BP 130/75 | HR 55 | Ht 74.0 in | Wt 240.0 lb

## 2015-02-03 DIAGNOSIS — B351 Tinea unguium: Secondary | ICD-10-CM | POA: Diagnosis not present

## 2015-02-03 NOTE — Progress Notes (Signed)
Subjective: 80 year old male presents accompanied by his wife requesting toe nails trimmed. Nails are deformed and painful.  Objective: Thick dystrophic nails x 10. Neurovascular status are within normal. Rectus foot without gross deformity.  Assessment: Hypertrophic mycotic nails x 10.  Plan: All nails debrided.

## 2015-02-03 NOTE — Patient Instructions (Signed)
Seen for hypertrophic nails. All nails debrided. Return in 3 months or as needed.  

## 2015-02-05 ENCOUNTER — Other Ambulatory Visit: Payer: Self-pay | Admitting: *Deleted

## 2015-02-05 ENCOUNTER — Telehealth: Payer: Self-pay | Admitting: Oncology

## 2015-02-05 ENCOUNTER — Ambulatory Visit (HOSPITAL_BASED_OUTPATIENT_CLINIC_OR_DEPARTMENT_OTHER): Payer: Medicare Other | Admitting: Oncology

## 2015-02-05 ENCOUNTER — Other Ambulatory Visit (HOSPITAL_BASED_OUTPATIENT_CLINIC_OR_DEPARTMENT_OTHER): Payer: Medicare Other

## 2015-02-05 VITALS — BP 117/71 | HR 50 | Temp 98.0°F | Resp 18 | Ht 74.0 in | Wt 238.6 lb

## 2015-02-05 DIAGNOSIS — C712 Malignant neoplasm of temporal lobe: Secondary | ICD-10-CM | POA: Diagnosis present

## 2015-02-05 DIAGNOSIS — C719 Malignant neoplasm of brain, unspecified: Secondary | ICD-10-CM

## 2015-02-05 LAB — COMPREHENSIVE METABOLIC PANEL (CC13)
ALBUMIN: 3.5 g/dL (ref 3.5–5.0)
ALT: 6 U/L (ref 0–55)
AST: 12 U/L (ref 5–34)
Alkaline Phosphatase: 52 U/L (ref 40–150)
Anion Gap: 7 mEq/L (ref 3–11)
BUN: 14.8 mg/dL (ref 7.0–26.0)
CALCIUM: 9.2 mg/dL (ref 8.4–10.4)
CHLORIDE: 109 meq/L (ref 98–109)
CO2: 25 mEq/L (ref 22–29)
CREATININE: 1.2 mg/dL (ref 0.7–1.3)
EGFR: 57 mL/min/{1.73_m2} — ABNORMAL LOW (ref >90)
Glucose: 101 mg/dl (ref 70–140)
POTASSIUM: 4.2 meq/L (ref 3.5–5.1)
Sodium: 141 mEq/L (ref 136–145)
Total Bilirubin: 0.78 mg/dL (ref 0.20–1.20)
Total Protein: 5.4 g/dL — ABNORMAL LOW (ref 6.4–8.3)

## 2015-02-05 LAB — CBC WITH DIFFERENTIAL/PLATELET
BASO%: 1.3 % (ref 0.0–2.0)
Basophils Absolute: 0.1 10*3/uL (ref 0.0–0.1)
EOS%: 3.8 % (ref 0.0–7.0)
Eosinophils Absolute: 0.2 10*3/uL (ref 0.0–0.5)
HCT: 39 % (ref 38.4–49.9)
HGB: 13 g/dL (ref 13.0–17.1)
LYMPH%: 14 % (ref 14.0–49.0)
MCH: 30 pg (ref 27.2–33.4)
MCHC: 33.3 g/dL (ref 32.0–36.0)
MCV: 90.1 fL (ref 79.3–98.0)
MONO#: 0.8 10*3/uL (ref 0.1–0.9)
MONO%: 14.3 % — ABNORMAL HIGH (ref 0.0–14.0)
NEUT#: 3.7 10*3/uL (ref 1.5–6.5)
NEUT%: 66.6 % (ref 39.0–75.0)
Platelets: 154 10*3/uL (ref 140–400)
RBC: 4.33 10*6/uL (ref 4.20–5.82)
RDW: 14.5 % (ref 11.0–14.6)
WBC: 5.6 10*3/uL (ref 4.0–10.3)
lymph#: 0.8 10*3/uL — ABNORMAL LOW (ref 0.9–3.3)

## 2015-02-05 MED ORDER — SULFAMETHOXAZOLE-TRIMETHOPRIM 800-160 MG PO TABS: 1.0000 | ORAL_TABLET | ORAL | Status: AC

## 2015-02-05 MED ORDER — TEMOZOLOMIDE 180 MG PO CAPS: 360.0000 mg | ORAL_CAPSULE | Freq: Every day | ORAL | Status: DC

## 2015-02-05 MED ORDER — ONDANSETRON HCL 8 MG PO TABS: ORAL_TABLET | ORAL | Status: AC

## 2015-02-05 NOTE — Telephone Encounter (Signed)
gv and printed appt sched anda vs for pt for March.... °

## 2015-02-05 NOTE — Progress Notes (Signed)
  [place] OFFICE PROGRESS NOTE   Diagnosis: GBM  INTERVAL HISTORY:   [name] returns as scheduled. He completed another cycle of temozolomide beginning to 8 2016. He tolerated the temozolomide well. No new neurologic symptoms. No nausea or mouth sores after the temozolomide. He reports Dr. [name] left him a message regarding the subdural hematoma noted on the MRI 01/29/2015.  Objective:  Vital signs in last 24 hours:  Blood pressure 117/71, pulse 50, temperature 98 F (36.7 C), temperature source Oral, resp. rate 18, height $RemoveBe'6\' 2"'xHSCBLGMg$  (1.88 m), weight 238 lb 9.6 oz (108.228 kg).    HEENT: No thrush or ulcers Resp: Lungs clear bilaterally Cardio: Regular rate and rhythm GI: No hepatomegaly, nontender Vascular: Trace edema at the low leg bilaterally with support stockings in place Neuro: Alert, follows commands, the motor exam appears intact in the upper and lower extremities    Lab Results:  Lab Results  Component Value Date   WBC 5.6 02/05/2015   HGB 13.0 02/05/2015   HCT 39.0 02/05/2015   MCV 90.1 02/05/2015   PLT 154 02/05/2015   NEUTROABS 3.7 02/05/2015      Imaging:  Brain MRI 01/29/2015, compared to 08/21/2014-right-sided subdural hematoma with mild mass effect on the right hemisphere and trace left midline shift. Right temporal lobe resection cavity with mild surrounding signal changes and enhancement Medications: I have reviewed the patient's current medications.  Assessment/Plan: 1. Glioblastoma Multiforme, status post resection of a right temporal mass 08/20/2014   MGMT methylation not detected  Negative for IDH1 and IDH2 mutations  Initiation of adjuvant temozolomide and radiation 09/11/2014; completion of radiation 10/22/2014.  Cycle 1 adjuvant temozolomide 11/17/2014  Cycle 2 temozolomide 12/15/2014  Cycle 3 temozolomide 01/12/2015 2. Altered mental status secondary to #1  3. History of coronary artery disease  4. Lower  extremity varicosities with right lower extremity stasis changes  5. Sleep apnea  6. Right subdural hematoma noted on the brain MRI 01/29/2015-he is scheduled to see Dr. [name]   Disposition:  [name] appears stable. He will begin another cycle of adjuvant temozolomide on 02/09/2015. He will return for an office and lab visit 03/05/2015. I discussed the brain MRI with Dr. [name] by telephone on 01/29/2015.  Betsy Coder, MD  02/05/2015  9:49 AM

## 2015-02-24 ENCOUNTER — Encounter: Payer: Self-pay | Admitting: Cardiology

## 2015-03-05 ENCOUNTER — Ambulatory Visit (HOSPITAL_BASED_OUTPATIENT_CLINIC_OR_DEPARTMENT_OTHER): Payer: Medicare Other | Admitting: Oncology

## 2015-03-05 ENCOUNTER — Other Ambulatory Visit (HOSPITAL_BASED_OUTPATIENT_CLINIC_OR_DEPARTMENT_OTHER): Payer: Medicare Other

## 2015-03-05 ENCOUNTER — Other Ambulatory Visit: Payer: Self-pay | Admitting: *Deleted

## 2015-03-05 ENCOUNTER — Telehealth: Payer: Self-pay | Admitting: Oncology

## 2015-03-05 VITALS — BP 116/67 | HR 57 | Temp 97.7°F | Resp 18 | Ht 74.0 in | Wt 241.9 lb

## 2015-03-05 DIAGNOSIS — H6121 Impacted cerumen, right ear: Secondary | ICD-10-CM

## 2015-03-05 DIAGNOSIS — C719 Malignant neoplasm of brain, unspecified: Secondary | ICD-10-CM

## 2015-03-05 LAB — COMPREHENSIVE METABOLIC PANEL (CC13)
ALK PHOS: 50 U/L (ref 40–150)
ALT: 13 U/L (ref 0–55)
AST: 12 U/L (ref 5–34)
Albumin: 3.4 g/dL — ABNORMAL LOW (ref 3.5–5.0)
Anion Gap: 9 mEq/L (ref 3–11)
BILIRUBIN TOTAL: 0.67 mg/dL (ref 0.20–1.20)
BUN: 16.6 mg/dL (ref 7.0–26.0)
CO2: 24 mEq/L (ref 22–29)
Calcium: 9 mg/dL (ref 8.4–10.4)
Chloride: 109 mEq/L (ref 98–109)
Creatinine: 1.1 mg/dL (ref 0.7–1.3)
EGFR: 63 mL/min/{1.73_m2} — AB (ref >90)
Glucose: 98 mg/dl (ref 70–140)
POTASSIUM: 4.1 meq/L (ref 3.5–5.1)
Sodium: 141 mEq/L (ref 136–145)
Total Protein: 5.4 g/dL — ABNORMAL LOW (ref 6.4–8.3)

## 2015-03-05 LAB — CBC WITH DIFFERENTIAL/PLATELET
BASO%: 1.3 % (ref 0.0–2.0)
Basophils Absolute: 0.1 10*3/uL (ref 0.0–0.1)
EOS%: 5.3 % (ref 0.0–7.0)
Eosinophils Absolute: 0.3 10*3/uL (ref 0.0–0.5)
HCT: 37.5 % — ABNORMAL LOW (ref 38.4–49.9)
HGB: 12.3 g/dL — ABNORMAL LOW (ref 13.0–17.1)
LYMPH%: 12.5 % — AB (ref 14.0–49.0)
MCH: 29.2 pg (ref 27.2–33.4)
MCHC: 32.9 g/dL (ref 32.0–36.0)
MCV: 88.7 fL (ref 79.3–98.0)
MONO#: 0.8 10*3/uL (ref 0.1–0.9)
MONO%: 12.5 % (ref 0.0–14.0)
NEUT#: 4.1 10*3/uL (ref 1.5–6.5)
NEUT%: 68.4 % (ref 39.0–75.0)
Platelets: 194 10*3/uL (ref 140–400)
RBC: 4.22 10*6/uL (ref 4.20–5.82)
RDW: 15.6 % — ABNORMAL HIGH (ref 11.0–14.6)
WBC: 6 10*3/uL (ref 4.0–10.3)
lymph#: 0.7 10*3/uL — ABNORMAL LOW (ref 0.9–3.3)

## 2015-03-05 MED ORDER — TEMOZOLOMIDE 180 MG PO CAPS: 360.0000 mg | ORAL_CAPSULE | Freq: Every day | ORAL | Status: DC

## 2015-03-05 NOTE — Progress Notes (Signed)
  [place] OFFICE PROGRESS NOTE   Diagnosis: Glioblastoma  INTERVAL HISTORY:   Mr. Dines returns as scheduled. He completed another cycle of temozolomide beginning 02/09/2015. No nausea. He feels well. He complains of an abnormal feeling in the right ear.  Objective:  Vital signs in last 24 hours:  Blood pressure 116/67, pulse 57, temperature 97.7 F (36.5 C), temperature source Oral, resp. rate 18, height $RemoveBe'6\' 2"'gRzFVpIbn$  (1.88 m), weight 241 lb 14.4 oz (109.725 kg), SpO2 99 %.    HEENT: The right external canal is blocked with cerumen, the left external canal and tympanic membrane are clear. No thrush or ulcers. Resp: Decreased breath sounds with coarse rhonchi at the bases, no respiratory distress Cardio: Regular rate and rhythm GI: No hepatomegaly, nontender Vascular: Trace edema at the left greater than right lower leg with support stockings in place Neuro: Alert, follows commands, motor exam appears intact in the upper and lower extremities    Portacath/PICC-without erythema  Lab Results:  Lab Results  Component Value Date   WBC 6.0 03/05/2015   HGB 12.3* 03/05/2015   HCT 37.5* 03/05/2015   MCV 88.7 03/05/2015   PLT 194 03/05/2015   NEUTROABS 4.1 03/05/2015     Medications: I have reviewed the patient's current medications.  Assessment/Plan: 1. Glioblastoma Multiforme, status post resection of a right temporal mass 08/20/2014   MGMT methylation not detected  Negative for IDH1 and IDH2 mutations  Initiation of adjuvant temozolomide and radiation 09/11/2014; completion of radiation 10/22/2014.  Cycle 1 adjuvant temozolomide 11/17/2014  Cycle 2 temozolomide 12/15/2014  Cycle 3 temozolomide 01/12/2015  Cycle 4 temozolomide 02/09/2015 2. Altered mental status secondary to #1  3. History of coronary artery disease  4. 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 Sleep apnea  6. Right subdural hematoma noted on the  brain MRI 01/29/2015-he was evaluated by Dr. [name] Disposition:  Mr. Wiltsey continues to tolerate the temozolomide well. He will proceed with another cycle beginning 03/09/2015. He will be scheduled for an office and lab visit 04/03/2015. I recommended he see Dr. Joylene Draft to evaluate the right external canal.  Betsy Coder, MD  03/05/2015  10:28 AM

## 2015-03-05 NOTE — Telephone Encounter (Signed)
Gave avs & calendar for April. °

## 2015-03-06 ENCOUNTER — Telehealth: Payer: Self-pay

## 2015-03-06 NOTE — Telephone Encounter (Signed)
[name] called stating she had not heard anything from [facility] about temodar. Rx was e-scribed 3/31. Told [name] this and she will call [facility].

## 2015-03-27 ENCOUNTER — Encounter: Payer: Self-pay | Admitting: Neurology

## 2015-04-03 ENCOUNTER — Ambulatory Visit (HOSPITAL_BASED_OUTPATIENT_CLINIC_OR_DEPARTMENT_OTHER): Payer: Medicare Other | Admitting: Nurse Practitioner

## 2015-04-03 ENCOUNTER — Telehealth: Payer: Self-pay | Admitting: Oncology

## 2015-04-03 ENCOUNTER — Other Ambulatory Visit (HOSPITAL_BASED_OUTPATIENT_CLINIC_OR_DEPARTMENT_OTHER): Payer: Medicare Other

## 2015-04-03 VITALS — BP 120/74 | HR 52 | Temp 98.2°F | Resp 18 | Ht 74.0 in | Wt 242.2 lb

## 2015-04-03 DIAGNOSIS — I2510 Atherosclerotic heart disease of native coronary artery without angina pectoris: Secondary | ICD-10-CM | POA: Diagnosis not present

## 2015-04-03 DIAGNOSIS — R4182 Altered mental status, unspecified: Secondary | ICD-10-CM

## 2015-04-03 DIAGNOSIS — C712 Malignant neoplasm of temporal lobe: Secondary | ICD-10-CM

## 2015-04-03 DIAGNOSIS — C719 Malignant neoplasm of brain, unspecified: Secondary | ICD-10-CM

## 2015-04-03 LAB — CBC WITH DIFFERENTIAL/PLATELET
BASO%: 1.5 % (ref 0.0–2.0)
Basophils Absolute: 0.1 10*3/uL (ref 0.0–0.1)
EOS%: 5.2 % (ref 0.0–7.0)
Eosinophils Absolute: 0.3 10*3/uL (ref 0.0–0.5)
HEMATOCRIT: 38.4 % (ref 38.4–49.9)
HGB: 12.6 g/dL — ABNORMAL LOW (ref 13.0–17.1)
LYMPH%: 13.6 % — ABNORMAL LOW (ref 14.0–49.0)
MCH: 29.2 pg (ref 27.2–33.4)
MCHC: 32.8 g/dL (ref 32.0–36.0)
MCV: 88.9 fL (ref 79.3–98.0)
MONO#: 0.7 10*3/uL (ref 0.1–0.9)
MONO%: 13.1 % (ref 0.0–14.0)
NEUT#: 3.7 10*3/uL (ref 1.5–6.5)
NEUT%: 66.6 % (ref 39.0–75.0)
PLATELETS: 202 10*3/uL (ref 140–400)
RBC: 4.32 10*6/uL (ref 4.20–5.82)
RDW: 15 % — ABNORMAL HIGH (ref 11.0–14.6)
WBC: 5.6 10*3/uL (ref 4.0–10.3)
lymph#: 0.8 10*3/uL — ABNORMAL LOW (ref 0.9–3.3)

## 2015-04-03 LAB — COMPREHENSIVE METABOLIC PANEL (CC13)
ALT: 11 U/L (ref 0–55)
ANION GAP: 6 meq/L (ref 3–11)
AST: 12 U/L (ref 5–34)
Albumin: 3.6 g/dL (ref 3.5–5.0)
Alkaline Phosphatase: 51 U/L (ref 40–150)
BILIRUBIN TOTAL: 0.74 mg/dL (ref 0.20–1.20)
BUN: 18.1 mg/dL (ref 7.0–26.0)
CHLORIDE: 109 meq/L (ref 98–109)
CO2: 25 meq/L (ref 22–29)
Calcium: 8.9 mg/dL (ref 8.4–10.4)
Creatinine: 1.1 mg/dL (ref 0.7–1.3)
EGFR: 62 mL/min/{1.73_m2} — ABNORMAL LOW (ref >90)
Glucose: 99 mg/dl (ref 70–140)
POTASSIUM: 4.1 meq/L (ref 3.5–5.1)
Sodium: 140 mEq/L (ref 136–145)
Total Protein: 5.5 g/dL — ABNORMAL LOW (ref 6.4–8.3)

## 2015-04-03 MED ORDER — TEMOZOLOMIDE 180 MG PO CAPS: 360.0000 mg | ORAL_CAPSULE | Freq: Every day | ORAL | Status: DC

## 2015-04-03 NOTE — Telephone Encounter (Signed)
Gave avs & calendar for June. °

## 2015-04-03 NOTE — Progress Notes (Signed)
  [place] OFFICE PROGRESS NOTE   Diagnosis:  Glioblastoma  INTERVAL HISTORY:   Mr. Raden returns as scheduled. He completed cycle 5 temozolomide beginning 03/09/2015. He denies nausea/vomiting. No mouth sores. No diarrhea. No rash. He denies shortness of breath. He has a good appetite. No falls. No headaches. No diplopia.  Objective:  Vital signs in last 24 hours:  Blood pressure 120/74, pulse 52, temperature 98.2 F (36.8 C), temperature source Oral, resp. rate 18, height $RemoveBe'6\' 2"'JiLgZSubm$  (1.88 m), weight 242 lb 3.2 oz (109.861 kg), SpO2 98 %.    HEENT: No thrush or ulcers. PERRLA. Resp: Breath sounds diminished at the bases with faint rhonchi. Cardio: Regular rate and rhythm. GI: Abdomen soft and nontender. No hepatomegaly. Vascular: Trace bilateral pretibial/lower leg edema left greater than right. Neuro: Alert. Follows commands. Motor strength 5 over 5.   Lab Results:  Lab Results  Component Value Date   WBC 5.6 04/03/2015   HGB 12.6* 04/03/2015   HCT 38.4 04/03/2015   MCV 88.9 04/03/2015   PLT 202 04/03/2015   NEUTROABS 3.7 04/03/2015    Imaging:  No results found.  Medications: I have reviewed the patient's current medications.  Assessment/Plan: 1. Glioblastoma Multiforme, status post resection of a right temporal mass 08/20/2014   MGMT methylation not detected  Negative for IDH1 and IDH2 mutations  Initiation of adjuvant temozolomide and radiation 09/11/2014; completion of radiation 10/22/2014.  Cycle 1 adjuvant temozolomide 11/17/2014  Cycle 2 temozolomide 12/15/2014  Cycle 3 temozolomide 01/12/2015  Cycle 4 temozolomide 02/09/2015  Cycle 5 temozolomide 03/09/2015  Cycle 6 temozolomide 04/06/2015 2. Altered mental status secondary to #1  3. History of coronary artery disease  4. 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 Sleep apnea  6. Right subdural hematoma noted on the brain MRI 01/29/2015-he  was evaluated by Dr. [name]   Disposition: [name] appears stable. He has completed 5 cycles of temozolomide. Plan to proceed with the sixth and final cycle beginning 04/06/2015. He will return for a follow-up visit in 4-6 weeks. He will contact the office in the interim with any problems.  Plan reviewed with Dr. Benay Spice.    Ned Card ANP/GNP-BC   04/03/2015  10:23 AM

## 2015-05-01 ENCOUNTER — Inpatient Hospital Stay (HOSPITAL_COMMUNITY)
Admission: EM | Admit: 2015-05-01 | Discharge: 2015-05-05 | DRG: 054 | Disposition: A | Discharge status: Skilled Nursing Facility | Payer: Medicare Other | Attending: Internal Medicine | Admitting: Internal Medicine

## 2015-05-01 ENCOUNTER — Encounter (HOSPITAL_COMMUNITY): Payer: Self-pay | Admitting: Emergency Medicine

## 2015-05-01 ENCOUNTER — Emergency Department (HOSPITAL_COMMUNITY): Payer: Medicare Other

## 2015-05-01 ENCOUNTER — Telehealth: Payer: Self-pay | Admitting: *Deleted

## 2015-05-01 DIAGNOSIS — Z923 Personal history of irradiation: Secondary | ICD-10-CM | POA: Diagnosis not present

## 2015-05-01 DIAGNOSIS — I2510 Atherosclerotic heart disease of native coronary artery without angina pectoris: Secondary | ICD-10-CM | POA: Diagnosis not present

## 2015-05-01 DIAGNOSIS — F05 Delirium due to known physiological condition: Secondary | ICD-10-CM | POA: Insufficient documentation

## 2015-05-01 DIAGNOSIS — G936 Cerebral edema: Secondary | ICD-10-CM | POA: Diagnosis present

## 2015-05-01 DIAGNOSIS — Z66 Do not resuscitate: Secondary | ICD-10-CM | POA: Insufficient documentation

## 2015-05-01 DIAGNOSIS — E785 Hyperlipidemia, unspecified: Secondary | ICD-10-CM | POA: Diagnosis not present

## 2015-05-01 DIAGNOSIS — R404 Transient alteration of awareness: Secondary | ICD-10-CM | POA: Diagnosis not present

## 2015-05-01 DIAGNOSIS — Z833 Family history of diabetes mellitus: Secondary | ICD-10-CM

## 2015-05-01 DIAGNOSIS — R4182 Altered mental status, unspecified: Secondary | ICD-10-CM

## 2015-05-01 DIAGNOSIS — Z6829 Body mass index (BMI) 29.0-29.9, adult: Secondary | ICD-10-CM

## 2015-05-01 DIAGNOSIS — R001 Bradycardia, unspecified: Secondary | ICD-10-CM | POA: Diagnosis present

## 2015-05-01 DIAGNOSIS — Z515 Encounter for palliative care: Secondary | ICD-10-CM | POA: Diagnosis not present

## 2015-05-01 DIAGNOSIS — I252 Old myocardial infarction: Secondary | ICD-10-CM | POA: Diagnosis not present

## 2015-05-01 DIAGNOSIS — Z809 Family history of malignant neoplasm, unspecified: Secondary | ICD-10-CM

## 2015-05-01 DIAGNOSIS — I872 Venous insufficiency (chronic) (peripheral): Secondary | ICD-10-CM | POA: Diagnosis present

## 2015-05-01 DIAGNOSIS — I10 Essential (primary) hypertension: Secondary | ICD-10-CM | POA: Diagnosis present

## 2015-05-01 DIAGNOSIS — C712 Malignant neoplasm of temporal lobe: Secondary | ICD-10-CM | POA: Diagnosis present

## 2015-05-01 DIAGNOSIS — C719 Malignant neoplasm of brain, unspecified: Secondary | ICD-10-CM | POA: Diagnosis not present

## 2015-05-01 DIAGNOSIS — R4781 Slurred speech: Secondary | ICD-10-CM | POA: Diagnosis present

## 2015-05-01 DIAGNOSIS — G4733 Obstructive sleep apnea (adult) (pediatric): Secondary | ICD-10-CM | POA: Diagnosis not present

## 2015-05-01 DIAGNOSIS — Z951 Presence of aortocoronary bypass graft: Secondary | ICD-10-CM

## 2015-05-01 DIAGNOSIS — E780 Pure hypercholesterolemia: Secondary | ICD-10-CM | POA: Diagnosis present

## 2015-05-01 DIAGNOSIS — Z9989 Dependence on other enabling machines and devices: Secondary | ICD-10-CM

## 2015-05-01 DIAGNOSIS — Z79899 Other long term (current) drug therapy: Secondary | ICD-10-CM | POA: Diagnosis not present

## 2015-05-01 DIAGNOSIS — Z7982 Long term (current) use of aspirin: Secondary | ICD-10-CM

## 2015-05-01 DIAGNOSIS — E669 Obesity, unspecified: Secondary | ICD-10-CM | POA: Diagnosis present

## 2015-05-01 DIAGNOSIS — K219 Gastro-esophageal reflux disease without esophagitis: Secondary | ICD-10-CM | POA: Diagnosis present

## 2015-05-01 DIAGNOSIS — R410 Disorientation, unspecified: Secondary | ICD-10-CM | POA: Diagnosis not present

## 2015-05-01 LAB — DIFFERENTIAL
BASOS ABS: 0 10*3/uL (ref 0.0–0.1)
Basophils Relative: 1 % (ref 0–1)
Eosinophils Absolute: 0.2 10*3/uL (ref 0.0–0.7)
Eosinophils Relative: 3 % (ref 0–5)
LYMPHS ABS: 0.7 10*3/uL (ref 0.7–4.0)
LYMPHS PCT: 10 % — AB (ref 12–46)
MONO ABS: 0.6 10*3/uL (ref 0.1–1.0)
Monocytes Relative: 9 % (ref 3–12)
NEUTROS PCT: 77 % (ref 43–77)
Neutro Abs: 5.1 10*3/uL (ref 1.7–7.7)

## 2015-05-01 LAB — I-STAT CHEM 8, ED
BUN: 18 mg/dL (ref 6–20)
CALCIUM ION: 1.19 mmol/L (ref 1.13–1.30)
Chloride: 103 mmol/L (ref 101–111)
Creatinine, Ser: 1.2 mg/dL (ref 0.61–1.24)
Glucose, Bld: 101 mg/dL — ABNORMAL HIGH (ref 65–99)
HCT: 39 % (ref 39.0–52.0)
HEMOGLOBIN: 13.3 g/dL (ref 13.0–17.0)
Potassium: 3.7 mmol/L (ref 3.5–5.1)
Sodium: 138 mmol/L (ref 135–145)
TCO2: 19 mmol/L (ref 0–100)

## 2015-05-01 LAB — COMPREHENSIVE METABOLIC PANEL
ALBUMIN: 3.7 g/dL (ref 3.5–5.0)
ALK PHOS: 46 U/L (ref 38–126)
ALT: 10 U/L — ABNORMAL LOW (ref 17–63)
AST: 18 U/L (ref 15–41)
Anion gap: 9 (ref 5–15)
BILIRUBIN TOTAL: 0.6 mg/dL (ref 0.3–1.2)
BUN: 16 mg/dL (ref 6–20)
CHLORIDE: 107 mmol/L (ref 101–111)
CO2: 23 mmol/L (ref 22–32)
CREATININE: 1.23 mg/dL (ref 0.61–1.24)
Calcium: 9 mg/dL (ref 8.9–10.3)
GFR calc non Af Amer: 54 mL/min — ABNORMAL LOW (ref >60)
Glucose, Bld: 104 mg/dL — ABNORMAL HIGH (ref 65–99)
POTASSIUM: 3.8 mmol/L (ref 3.5–5.1)
Sodium: 139 mmol/L (ref 135–145)
Total Protein: 5.9 g/dL — ABNORMAL LOW (ref 6.5–8.1)

## 2015-05-01 LAB — CBC
HCT: 38.7 % — ABNORMAL LOW (ref 39.0–52.0)
Hemoglobin: 13.2 g/dL (ref 13.0–17.0)
MCH: 30.1 pg (ref 26.0–34.0)
MCHC: 34.1 g/dL (ref 30.0–36.0)
MCV: 88.4 fL (ref 78.0–100.0)
PLATELETS: 193 10*3/uL (ref 150–400)
RBC: 4.38 MIL/uL (ref 4.22–5.81)
RDW: 14.1 % (ref 11.5–15.5)
WBC: 6.6 10*3/uL (ref 4.0–10.5)

## 2015-05-01 LAB — URINALYSIS, ROUTINE W REFLEX MICROSCOPIC
Bilirubin Urine: NEGATIVE
GLUCOSE, UA: NEGATIVE mg/dL
HGB URINE DIPSTICK: NEGATIVE
Ketones, ur: NEGATIVE mg/dL
Leukocytes, UA: NEGATIVE
Nitrite: NEGATIVE
Protein, ur: NEGATIVE mg/dL
Specific Gravity, Urine: 1.025 (ref 1.005–1.030)
Urobilinogen, UA: 1 mg/dL (ref 0.0–1.0)
pH: 6 (ref 5.0–8.0)

## 2015-05-01 LAB — I-STAT TROPONIN, ED: TROPONIN I, POC: 0.01 ng/mL (ref 0.00–0.08)

## 2015-05-01 MED ORDER — GADOBENATE DIMEGLUMINE 529 MG/ML IV SOLN: 20.0000 mL | Freq: Once | INTRAVENOUS | Status: DC | PRN

## 2015-05-01 MED ORDER — THIAMINE HCL 100 MG/ML IJ SOLN
100.0000 mg | Freq: Every day | INTRAMUSCULAR | Status: DC
  Filled 2015-05-01: qty 1

## 2015-05-01 MED ORDER — FOLIC ACID 5 MG/ML IJ SOLN
1.0000 mg | Freq: Every day | INTRAMUSCULAR | Status: DC
  Filled 2015-05-01: qty 0.2

## 2015-05-01 MED ORDER — DEXAMETHASONE SODIUM PHOSPHATE 10 MG/ML IJ SOLN
10.0000 mg | Freq: Once | INTRAMUSCULAR | Status: AC
  Administered 2015-05-01: 10 mg via INTRAVENOUS
  Filled 2015-05-01: qty 1

## 2015-05-01 MED ORDER — ACETAMINOPHEN 325 MG PO TABS: 650.0000 mg | ORAL_TABLET | Freq: Four times a day (QID) | ORAL | Status: DC | PRN

## 2015-05-01 MED ORDER — PANTOPRAZOLE SODIUM 40 MG IV SOLR
40.0000 mg | Freq: Two times a day (BID) | INTRAVENOUS | Status: DC
  Administered 2015-05-01 – 2015-05-02 (×3): 40 mg via INTRAVENOUS
  Filled 2015-05-01 (×4): qty 40

## 2015-05-01 MED ORDER — ONDANSETRON HCL 4 MG/2ML IJ SOLN: 4.0000 mg | Freq: Four times a day (QID) | INTRAMUSCULAR | Status: DC | PRN

## 2015-05-01 MED ORDER — SODIUM CHLORIDE 0.9 % IJ SOLN
3.0000 mL | Freq: Two times a day (BID) | INTRAMUSCULAR | Status: DC
  Administered 2015-05-01 – 2015-05-04 (×7): 3 mL via INTRAVENOUS

## 2015-05-01 MED ORDER — METOPROLOL TARTRATE 25 MG PO TABS
25.0000 mg | ORAL_TABLET | Freq: Two times a day (BID) | ORAL | Status: DC
  Administered 2015-05-01 – 2015-05-03 (×4): 25 mg via ORAL
  Filled 2015-05-01 (×7): qty 1

## 2015-05-01 MED ORDER — ASPIRIN EC 81 MG PO TBEC
81.0000 mg | DELAYED_RELEASE_TABLET | Freq: Every day | ORAL | Status: DC
Start: 2015-05-02 — End: 2015-05-05
  Administered 2015-05-02 – 2015-05-05 (×4): 81 mg via ORAL
  Filled 2015-05-01 (×4): qty 1

## 2015-05-01 MED ORDER — VITAMIN B-1 100 MG PO TABS
100.0000 mg | ORAL_TABLET | Freq: Every day | ORAL | Status: DC
  Administered 2015-05-01 – 2015-05-05 (×5): 100 mg via ORAL
  Filled 2015-05-01 (×5): qty 1

## 2015-05-01 MED ORDER — DOCUSATE SODIUM 100 MG PO CAPS
100.0000 mg | ORAL_CAPSULE | Freq: Two times a day (BID) | ORAL | Status: DC
  Administered 2015-05-01 – 2015-05-05 (×7): 100 mg via ORAL
  Filled 2015-05-01 (×9): qty 1

## 2015-05-01 MED ORDER — SODIUM CHLORIDE 0.9 % IJ SOLN
3.0000 mL | INTRAMUSCULAR | Status: DC | PRN
  Administered 2015-05-05: 3 mL via INTRAVENOUS
  Filled 2015-05-01: qty 3

## 2015-05-01 MED ORDER — ONDANSETRON HCL 4 MG PO TABS: 4.0000 mg | ORAL_TABLET | Freq: Four times a day (QID) | ORAL | Status: DC | PRN

## 2015-05-01 MED ORDER — SIMVASTATIN 40 MG PO TABS
40.0000 mg | ORAL_TABLET | Freq: Every day | ORAL | Status: DC
  Administered 2015-05-01 – 2015-05-04 (×4): 40 mg via ORAL
  Filled 2015-05-01 (×5): qty 1

## 2015-05-01 MED ORDER — DEXAMETHASONE SODIUM PHOSPHATE 4 MG/ML IJ SOLN
6.0000 mg | Freq: Four times a day (QID) | INTRAMUSCULAR | Status: DC
  Administered 2015-05-01 – 2015-05-04 (×10): 6 mg via INTRAVENOUS
  Filled 2015-05-01 (×14): qty 1.5

## 2015-05-01 MED ORDER — ONDANSETRON HCL 8 MG PO TABS: 8.0000 mg | ORAL_TABLET | Freq: Three times a day (TID) | ORAL | Status: DC | PRN

## 2015-05-01 MED ORDER — VITAMIN D 1000 UNITS PO TABS
1000.0000 [IU] | ORAL_TABLET | Freq: Every day | ORAL | Status: DC
  Administered 2015-05-02 – 2015-05-05 (×4): 1000 [IU] via ORAL
  Filled 2015-05-01 (×4): qty 1

## 2015-05-01 MED ORDER — SULFAMETHOXAZOLE-TRIMETHOPRIM 800-160 MG PO TABS
1.0000 | ORAL_TABLET | ORAL | Status: DC
  Administered 2015-05-04: 1 via ORAL
  Filled 2015-05-01: qty 1

## 2015-05-01 MED ORDER — SODIUM CHLORIDE 0.9 % IV SOLN: 250.0000 mL | INTRAVENOUS | Status: DC | PRN

## 2015-05-01 MED ORDER — ACETAMINOPHEN 650 MG RE SUPP: 650.0000 mg | Freq: Four times a day (QID) | RECTAL | Status: DC | PRN

## 2015-05-01 MED ORDER — TEMOZOLOMIDE 20 MG PO CAPS: 360.0000 mg | ORAL_CAPSULE | Freq: Every day | ORAL | Status: DC

## 2015-05-01 MED ORDER — FOLIC ACID 1 MG PO TABS
1.0000 mg | ORAL_TABLET | Freq: Every day | ORAL | Status: DC
  Administered 2015-05-01 – 2015-05-05 (×5): 1 mg via ORAL
  Filled 2015-05-01 (×5): qty 1

## 2015-05-01 MED ORDER — ALUM & MAG HYDROXIDE-SIMETH 200-200-20 MG/5ML PO SUSP: 30.0000 mL | Freq: Four times a day (QID) | ORAL | Status: DC | PRN

## 2015-05-01 NOTE — ED Notes (Signed)
Attempted report 

## 2015-05-01 NOTE — ED Notes (Signed)
Pt still at MRI, wife given gingerale and crackers.

## 2015-05-01 NOTE — ED Notes (Signed)
Family reports that pt has had slurred speech, unsteady gait, and confusion x 1 week. Pt has history of brain tumor and has been holding his head more often.

## 2015-05-01 NOTE — Progress Notes (Signed)
Requested copy of advance directive from wife.

## 2015-05-01 NOTE — Progress Notes (Signed)
Utilization Review completed.  Gwyndolyn [facility] RN CM  

## 2015-05-01 NOTE — Progress Notes (Signed)
Sent note to Dr. [facility] to request SCDs or foot pumps.

## 2015-05-01 NOTE — Progress Notes (Signed)
RT placed pt on auto titrate CPAP 5-20cmH2O with 2Lpm of oxygen bled in via ffm. Pt states he is comfortable and appears to be tolerating CPAP well at this time. RT will continue to monitor as needed.

## 2015-05-01 NOTE — Telephone Encounter (Signed)
VM message from pt's wife, stating that her husband has not been acting right all week-slurred speech, unsteady on his feet and holding his head like he did last September when diagnosed with brain tumor. Call back to wife and instructed her to take pt to ED asap as pt could be having a stroke or disease progression. She voiced understanding and will take him immediately.  She stated she will keep [place] updated.

## 2015-05-01 NOTE — ED Notes (Signed)
Notified [facility] for transportation to [facility]

## 2015-05-01 NOTE — H&P (Signed)
[facility] History and Physical                                                                                    [name], is a 80 y.o. male  MRN: [id number]   DOB - 10/03/1935  Admit Date - 05/01/2015  Outpatient Primary MD for the patient is Jerlyn Ly, MD  Referring MD: Audie Pinto / ER  Consulting MD: Lindi Adie / Oncology  With History of -  Past Medical History  Diagnosis Date  . Coronary artery disease   . MI, old     INFERIOR  . Hypertension   . Hyperlipidemia   . Chronic venous insufficiency     and varicose veins  . Anterior myocardial infarction 1990    with subsequent PTCA  . Hypercholesterolemia   . Ulcer of right leg   . Obstructive sleep apnea   . Anemia   . Obesity   . Radiation 09/11/14-10/22/14    right temperol lobe 60 gray  . Brain tumor 08/2014      Past Surgical History  Procedure Laterality Date  . Cardiac catheterization  06/30/2003    EF 50%  . Coronary artery bypass graft  01/2003    X6. LIMA GRAFT TO THE LDA, FREE RADICAL GRAFT TO THE OM1, SEQUENTIAL SAPHENOUS VEIN GRAFT TO THE FIRST AND SECOND DIAGONAL BRANCES, AND A SEQUENTIAL  VEIN GRAFT TO THE PDA AND POSTERIOR LATERAL BRANCHES OF THE RIGHT CORONARY  . Coronary angioplasty  06/2003    POSTERIOR LATERAL BRANCH  . Tonsillectomy and adenoidectomy    . Cardiovascular stress test  01/23/2009    EF 44%  . Craniotomy Right 08/20/2014    Procedure: CRANIOTOMY TUMOR EXCISION w/ BrainLab;  Surgeon: Newman Pies, MD;  Location: [facility];  Service: Neurosurgery;  Laterality: Right;  Right Craniotomy with brain lab for tumor    in for   Chief Complaint  Patient presents with  . Aphasia  . Gait Problem  . Altered Mental Status     HPI This is a 80 yo male post craniotomy for glioblastoma Sept 2015, on chemo/radiation with Dr Ammie Dalton and Dr. Sondra Come at [facility]. Also found with SDH on MRI Brain Feb 2016 (followed by Dr. [name]). Wife brought him to ER 2/2 concerns of one  week slurred speech with unsteady gait, patient putting on close backward or wrong side outwards. In ER noted with L A she'll drip. MRI brain w/o contrast revealed significant tumor recurrence at site of previous right temporal lobectomy measuring 4.5 x 6.3 x 4.0 cm. Associated with extensive vasogenic edema throughout right hemisphere with 5 mm right to left shift. EDP/[place] discussed with patient oncologist Sherrill who felt this is likely not surgical problem at this juncture. Recommendations for Decadron and inpatient evaluation. I also discussed with oncology who also informed me there were likely salvage chemotherapy/radiation options available to be discussed in the outpatient setting. Discussed option of transfer to [facility] with oncology as well as patient and wife all of which agree cancer Center most appropriate for this patient. Laboratory data unremarkable in the ER.   Review of Systems  In addition to the HPI above,  No Fever-chills, myalgias or other constitutional symptoms No Headache, changes with Vision or hearing, no focal signs other than left facial droop-gait disturbance and slurred speech as above as well as issues related to confusion No problems swallowing food or Liquids, indigestion/reflux No Chest pain, Cough or Shortness of Breath, palpitations, orthopnea or DOE No Abdominal pain, N/V; no melena or hematochezia, no dark tarry stools, Bowel movements are regular, No dysuria, hematuria or flank pain No new skin rashes, lesions, masses or bruises, No new joints pains-aches No recent weight gain or loss No polyuria, polydypsia or polyphagia,  *A full 10 point Review of Systems was done, except as stated above, all other Review of Systems were negative.  Social History History  Substance Use Topics  . Smoking status: Never Smoker   . Smokeless tobacco: Never Used  . Alcohol Use: No    Resides at: Private residence  Lives with: Wife  Ambulatory  status: Has cane that was able to ambulate independently up until recently when developed gait disturbance-no recent falls   Family History Family History  Problem Relation Age of Onset  . Cancer Sister   . Heart disease    . Diabetes Brother      Prior to Admission medications   Medication Sig Start Date End Date Taking? Authorizing Provider  aspirin 81 MG tablet Take 81 mg by mouth daily.    Historical Provider, MD  Cholecalciferol (VITAMIN D-3 PO) Take 1,000 Units by mouth daily.     Historical Provider, MD  metoprolol tartrate (LOPRESSOR) 25 MG tablet TAKE 1 TABLET BY MOUTH TWICE DAILY 12/09/14   Peter M Martinique, MD  mupirocin ointment Drue Stager) 2 %  03/04/15   Historical Provider, MD  nitroGLYCERIN (NITROSTAT) 0.4 MG SL tablet Place 1 tablet (0.4 mg total) under the tongue every 5 (five) minutes as needed. Patient not taking: Reported on 04/03/2015 12/26/12   Peter M Martinique, MD  ondansetron [facility]) 8 MG tablet Take one tablet 1 hour prior to Temodar dose and every 12 hours if needed for nausea 02/05/15   Ladell Pier, MD  polyethylene glycol [facility] Floria Raveling) packet Take 17 g by mouth daily as needed.     Historical Provider, MD  simvastatin (ZOCOR) 40 MG tablet Take 40 mg by mouth daily at 6 PM.  09/10/14   Historical Provider, MD  sulfamethoxazole-trimethoprim (BACTRIM DS,SEPTRA DS) 800-160 MG per tablet Take 1 tablet by mouth every Monday, Wednesday, and Friday. For 3 months 02/05/15   Ladell Pier, MD  temozolomide [facility]) 180 MG capsule Take 2 capsules (360 mg total) by mouth daily. Take X 5 days. May take on an empty stomach or at bedtime to decrease nausea & vomiting. 04/03/15   Ladell Pier, MD    No Known Allergies  Physical Exam  Vitals  Blood pressure 120/68, pulse 57, temperature 98.2 F (36.8 C), temperature source Oral, resp. rate 18, height 6\' 2"  (1.88 m), weight 242 lb 3 oz (109.856 kg), SpO2 100 %.   General:  In no acute distress, appears healthy and  well nourished  Psych: Alert but clearly has some short-term memory deficits and no insight into current situation-kept asking wife initially when he could go home-after my discussion with patient he was able to recall that he needed to stay in the hospital through the weekend  Neuro:  CN II through XII intact except for marked left facial drooping without left eye ptosis, Strength 5/5  all 4 extremities, Sensation intact all 4 extremities.  ENT:  Ears and Eyes appear Normal, Conjunctivae clear, PER. Moist oral mucosa without erythema or exudates.  Neck:  Supple, No lymphadenopathy appreciated  Respiratory:  Symmetrical chest wall movement, Good air movement bilaterally, CTAB. Room Air  Cardiac:  RRR, No Murmurs, no LE edema noted, no JVD, No carotid bruits, peripheral pulses palpable at 2+  Abdomen:  Positive bowel sounds, Soft, Non tender, Non distended,  No masses appreciated, no obvious hepatosplenomegaly  Skin:  No Cyanosis, Normal Skin Turgor, No Skin Rash or Bruise.  Extremities: Symmetrical without obvious trauma or injury,  no effusions.  Data Review  CBC  Recent Labs Lab 05/01/15 1103 05/01/15 1119  WBC 6.6  --   HGB 13.2 13.3  HCT 38.7* 39.0  PLT 193  --   MCV 88.4  --   MCH 30.1  --   MCHC 34.1  --   RDW 14.1  --   LYMPHSABS 0.7  --   MONOABS 0.6  --   EOSABS 0.2  --   BASOSABS 0.0  --     Chemistries   Recent Labs Lab 05/01/15 1103 05/01/15 1119  NA 139 138  K 3.8 3.7  CL 107 103  CO2 23  --   GLUCOSE 104* 101*  BUN 16 18  CREATININE 1.23 1.20  CALCIUM 9.0  --   AST 18  --   ALT 10*  --   ALKPHOS 46  --   BILITOT 0.6  --     estimated creatinine clearance is 67 mL/min (by C-G formula based on Cr of 1.2).  No results for input(s): TSH, T4TOTAL, T3FREE, THYROIDAB in the last 72 hours.  Invalid input(s): FREET3  Coagulation profile No results for input(s): INR, PROTIME in the last 168 hours.  No results for input(s): DDIMER in the last  72 hours.  Cardiac Enzymes No results for input(s): CKMB, TROPONINI, MYOGLOBIN in the last 168 hours.  Invalid input(s): CK  Invalid input(s): POCBNP  Urinalysis    Component Value Date/Time   COLORURINE YELLOW 05/01/2015 [place] 05/01/2015 1409   LABSPEC 1.025 05/01/2015 1409   PHURINE 6.0 05/01/2015 1409   GLUCOSEU NEGATIVE 05/01/2015 1409   HGBUR NEGATIVE 05/01/2015 1409   BILIRUBINUR NEGATIVE 05/01/2015 1409   KETONESUR NEGATIVE 05/01/2015 1409   PROTEINUR NEGATIVE 05/01/2015 1409   UROBILINOGEN 1.0 05/01/2015 1409   NITRITE NEGATIVE 05/01/2015 1409   LEUKOCYTESUR NEGATIVE 05/01/2015 1409    Imaging results:   Mr Jeri Cos Wo Contrast  05/01/2015   CLINICAL DATA:  Slurred speech, confusion, and unsteady gait for 1 week. History of GBM.  EXAM: MRI HEAD WITHOUT AND WITH CONTRAST  TECHNIQUE: Multiplanar, multiecho pulse sequences of the brain and surrounding structures were obtained without and with intravenous contrast.  CONTRAST:  MultiHance 20 mL.  COMPARISON:  01/29/2015 most recent.  Preoperative scan 08/19/2014.  FINDINGS: The patient is status post craniotomy for subtotal glioblastoma multiforme removal.  There is significant tumor recurrence now identified at the surgical site. Previously identified only slightly enhancing tumor bad now displays marked increase in size as well as irregular thick-walled enhancement area of tumor recurrence which is accompanied by necrosis/ encephalomalacia centrally is 45 x 63 x 40 mm. Enhancement extends to the lateral margin of the temporal horn. In addition, there is subependymal spread of tumor to the contralateral LEFT occipital horn. Marked surrounding vasogenic edema extends throughout the entire RIGHT hemisphere, and results and  RIGHT to LEFT shift of approximately 5 mm.  Generalized atrophy. Minor white matter disease. Previously identified RIGHT frontal extra-axial fluid collection has shown significant improvement since  the previous exam, now measuring 8 mm thick as opposed to 19 mm thick previously. Only minor mass effect on the RIGHT frontal cortex, not felt to be significant given the degree of atrophy.  IMPRESSION: Significant tumor recurrence now identified at the site of previous RIGHT temporal lobectomy. This measures 45 x 63 x 40 mm. Extensive vasogenic edema throughout the RIGHT hemisphere with 5 mm right-to-left shift.  Subependymal spread of tumor to the contralateral LEFT occipital horn.  Improved right-sided subdural hematoma. No significant mass effect at this time.   Electronically Signed   By: Rolla Flatten M.D.   On: 05/01/2015 14:01     EKG: (Independently reviewed) sinus rhythm, left axis deviation and prominent Q waves inferior leads, occasional PVC question of incomplete right bundle branch block   Assessment & Plan  Principal Problem:   Glioblastoma multiforme of brain (recurrent) -Admit to medical floor-plan to transfer to [facility] -Dr. Lindi Adie to see patient in a.m. 5/28 -Decadron IV 6 mg q 6 hrs-plan convert to PO prior to discharge -Suspect will need follow-up MRI -PT/OT evaluation before discharge -Salvage chemotherapy/radiation options available and to be discussed as an outpatient -Continue prophylactic Bactrim  Active Problems:   Coronary artery disease -Currently asymptomatic -Continue pre-admit meds    Hypertension -cont pre-admit meds    Hyperlipidemia -cont preadmit meds     OSA on CPAP -cont HS CPAP    History SDH MRI February 2016 -MRI today shows decrease in size -Utilize SCDs as opposed to pharmacological DVT prophylaxis    DVT Prophylaxis: SCDs  Family Communication:  Wife at bedside   Code Status:  Full code  Condition: Stable  Discharge disposition: Anticipate discharge back home with wife after improvement and vasogenic edema and if PT/OT recommends safe to return to home environment  Time spent in minutes : 60       ELLIS,ALLISON L. ANP on 05/01/2015 at 3:15 PM  Between 7am to 7pm - Pager - [phone]  After 7pm go to [email or web address] - password [email or web address]  And look for the night coverage person covering me after hours  [facility]

## 2015-05-02 DIAGNOSIS — Z515 Encounter for palliative care: Secondary | ICD-10-CM | POA: Insufficient documentation

## 2015-05-02 DIAGNOSIS — C719 Malignant neoplasm of brain, unspecified: Secondary | ICD-10-CM

## 2015-05-02 LAB — CBC
HCT: 37 % — ABNORMAL LOW (ref 39.0–52.0)
Hemoglobin: 12.3 g/dL — ABNORMAL LOW (ref 13.0–17.0)
MCH: 29.3 pg (ref 26.0–34.0)
MCHC: 33.2 g/dL (ref 30.0–36.0)
MCV: 88.1 fL (ref 78.0–100.0)
PLATELETS: 175 10*3/uL (ref 150–400)
RBC: 4.2 MIL/uL — AB (ref 4.22–5.81)
RDW: 13.8 % (ref 11.5–15.5)
WBC: 7.7 10*3/uL (ref 4.0–10.5)

## 2015-05-02 LAB — COMPREHENSIVE METABOLIC PANEL
ALT: 13 U/L — AB (ref 17–63)
ANION GAP: 9 (ref 5–15)
AST: 16 U/L (ref 15–41)
Albumin: 3.4 g/dL — ABNORMAL LOW (ref 3.5–5.0)
Alkaline Phosphatase: 44 U/L (ref 38–126)
BILIRUBIN TOTAL: 0.6 mg/dL (ref 0.3–1.2)
BUN: 16 mg/dL (ref 6–20)
CALCIUM: 8.8 mg/dL — AB (ref 8.9–10.3)
CHLORIDE: 104 mmol/L (ref 101–111)
CO2: 21 mmol/L — ABNORMAL LOW (ref 22–32)
Creatinine, Ser: 0.92 mg/dL (ref 0.61–1.24)
Glucose, Bld: 182 mg/dL — ABNORMAL HIGH (ref 65–99)
POTASSIUM: 4 mmol/L (ref 3.5–5.1)
SODIUM: 134 mmol/L — AB (ref 135–145)
Total Protein: 5.6 g/dL — ABNORMAL LOW (ref 6.5–8.1)

## 2015-05-02 MED ORDER — HALOPERIDOL LACTATE 5 MG/ML IJ SOLN
1.0000 mg | Freq: Four times a day (QID) | INTRAMUSCULAR | Status: DC | PRN
  Administered 2015-05-02 – 2015-05-04 (×3): 1 mg via INTRAVENOUS
  Filled 2015-05-02 (×3): qty 1

## 2015-05-02 MED ORDER — PANTOPRAZOLE SODIUM 40 MG PO TBEC
40.0000 mg | DELAYED_RELEASE_TABLET | Freq: Every day | ORAL | Status: DC
  Administered 2015-05-03 – 2015-05-05 (×3): 40 mg via ORAL
  Filled 2015-05-02 (×3): qty 1

## 2015-05-02 MED ORDER — LORAZEPAM 2 MG/ML IJ SOLN
0.5000 mg | Freq: Four times a day (QID) | INTRAMUSCULAR | Status: DC | PRN
  Administered 2015-05-02 – 2015-05-04 (×4): 0.5 mg via INTRAVENOUS
  Filled 2015-05-02 (×4): qty 1

## 2015-05-02 NOTE — Progress Notes (Signed)
Patient Demographics  [name], is a 80 y.o. male, DOB - [birth year], SWH:[id number]  Admit date - 05/01/2015   Admitting Physician Albertine Patricia, MD  Outpatient Primary MD for the patient is Jerlyn Ly, MD  LOS - 1   Chief Complaint  Patient presents with  . Aphasia  . Gait Problem  . Altered Mental Status        Subjective:   [name] today has, No headache, No chest pain, No abdominal pain - No Nausea, No new weakness tingling or numbness, No Cough - SOB.   Assessment & Plan    1. Recurrent glioblastoma multiformae in the right temporal lobe with midline shift and evidence of spread - had some personality change, worsening memory with minimally slurred speech and facial droop. Has improved on IV Decadron, now oriented 3, no problems with swallowing food or liquids. No focal neurological deficits.  Continue Decadron, oncology on board, have requested neurosurgeon Dr. Cyndy Freeze to evaluate the patient as well. His long-term prognosis appears poor. He already has had one resection last year with on going chemotherapy. Will involve palliative care as well. Would think any treatment would be palliative only.   2. Obstructive sleep apnea. On C Pap nighttime continue.   3. CAD. On combination of aspirin, beta blocker and statin for secondary prevention. Continue no acute issues.   4. Essential hypertension. Continue home dose better blocker and monitor.   5. Dyslipidemia. Home dose statin unchanged.   6. GERD. On PPI switch to oral.   7.Chronic Bactrim Rx ( chemo prophylaxis) - continue      Code Status: Full  Family Communication: None present  Disposition Plan: TBD   Consults oncology, N Surg, Pall care   Procedures   MRI Brain   DVT Prophylaxis     SCDs   Lab Results  Component Value Date   PLT 175 05/02/2015    Medications  Scheduled Meds: . aspirin EC  81 mg Oral Daily  . cholecalciferol  1,000 Units Oral Daily  . dexamethasone  6 mg Intravenous 4 times per day  . docusate sodium  100 mg Oral BID  . folic acid  1 mg Oral Daily  . metoprolol tartrate  25 mg Oral BID  . pantoprazole (PROTONIX) IV  40 mg Intravenous Q12H  . simvastatin  40 mg Oral q1800  . sodium chloride  3 mL Intravenous Q12H  . [START ON 05/04/2015] sulfamethoxazole-trimethoprim  1 tablet Oral Q M,W,F  . thiamine  100 mg Oral Daily   Continuous Infusions:  PRN Meds:.sodium chloride, acetaminophen **OR** acetaminophen, alum & mag hydroxide-simeth, ondansetron **OR** ondansetron (ZOFRAN) IV, sodium chloride  Antibiotics     Anti-infectives    Start     Dose/Rate Route Frequency Ordered Stop   05/04/15 1000  sulfamethoxazole-trimethoprim (BACTRIM DS,SEPTRA DS) 800-160 MG per tablet 1 tablet     1 tablet Oral Every M-W-F 05/01/15 1654          Objective:   Filed Vitals:   05/01/15 1731 05/01/15 2140 05/01/15 2200 05/02/15 0355  BP: 124/85 128/63  117/63  Pulse: 60 95 71 54  Temp: 98.1 F (36.7 C) 98.4 F (36.9 C)  98.4 F (36.9 C)  TempSrc: Oral Oral  Oral  Resp: 18 18 18 16   Height: 6\' 2"  (1.88 m)     Weight: 105 kg (231 lb 7.7 oz)     SpO2: 97% 98% 97% 96%    Wt Readings from Last 3 Encounters:  05/01/15 105 kg (231 lb 7.7 oz)  04/03/15 109.861 kg (242 lb 3.2 oz)  03/05/15 109.725 kg (241 lb 14.4 oz)     Intake/Output Summary (Last 24 hours) at 05/02/15 1030 Last data filed at 05/02/15 0950  Gross per 24 hour  Intake    123 ml  Output   1425 ml  Net  -1302 ml     Physical Exam  Awake Alert, Oriented X 3, mild facial droop with slightly slurred speech, No new F.N deficits, Normal affect [place].AT,PERRAL Supple Neck,No JVD, No cervical lymphadenopathy appriciated.  Symmetrical Chest wall movement, Good air movement bilaterally,  CTAB RRR,No Gallops,Rubs or new Murmurs, No Parasternal Heave +ve B.Sounds, Abd Soft, No tenderness, No organomegaly appriciated, No rebound - guarding or rigidity. No Cyanosis, Clubbing or edema, No new Rash or bruise      Data Review   Micro Results No results found for this or any previous visit (from the past 240 hour(s)).  Radiology Reports Mr Jeri Cos Wo Contrast  05/01/2015   CLINICAL DATA:  Slurred speech, confusion, and unsteady gait for 1 week. History of GBM.  EXAM: MRI HEAD WITHOUT AND WITH CONTRAST  TECHNIQUE: Multiplanar, multiecho pulse sequences of the brain and surrounding structures were obtained without and with intravenous contrast.  CONTRAST:  MultiHance 20 mL.  COMPARISON:  01/29/2015 most recent.  Preoperative scan 08/19/2014.  FINDINGS: The patient is status post craniotomy for subtotal glioblastoma multiforme removal.  There is significant tumor recurrence now identified at the surgical site. Previously identified only slightly enhancing tumor bad now displays marked increase in size as well as irregular thick-walled enhancement area of tumor recurrence which is accompanied by necrosis/ encephalomalacia centrally is 45 x 63 x 40 mm. Enhancement extends to the lateral margin of the temporal horn. In addition, there is subependymal spread of tumor to the contralateral LEFT occipital horn. Marked surrounding vasogenic edema extends throughout the entire RIGHT hemisphere, and results and RIGHT to LEFT shift of approximately 5 mm.  Generalized atrophy. Minor white matter disease. Previously identified RIGHT frontal extra-axial fluid collection has shown significant improvement since the previous exam, now measuring 8 mm thick as opposed to 19 mm thick previously. Only minor mass effect on the RIGHT frontal cortex, not felt to be significant given the degree of atrophy.  IMPRESSION: Significant tumor recurrence now identified at the site of previous RIGHT temporal lobectomy. This  measures 45 x 63 x 40 mm. Extensive vasogenic edema throughout the RIGHT hemisphere with 5 mm right-to-left shift.  Subependymal spread of tumor to the contralateral LEFT occipital horn.  Improved right-sided subdural hematoma. No significant mass effect at this time.   Electronically Signed   By: Rolla Flatten M.D.   On: 05/01/2015 14:01     CBC  Recent Labs Lab 05/01/15 1103 05/01/15 1119 05/02/15 0405  WBC 6.6  --  7.7  HGB 13.2 13.3 12.3*  HCT 38.7* 39.0 37.0*  PLT 193  --  175  MCV 88.4  --  88.1  MCH 30.1  --  29.3  MCHC 34.1  --  33.2  RDW 14.1  --  13.8  LYMPHSABS 0.7  --   --   MONOABS 0.6  --   --  EOSABS 0.2  --   --   BASOSABS 0.0  --   --     Chemistries   Recent Labs Lab 05/01/15 1103 05/01/15 1119 05/02/15 0405  NA 139 138 134*  K 3.8 3.7 4.0  CL 107 103 104  CO2 23  --  21*  GLUCOSE 104* 101* 182*  BUN 16 18 16   CREATININE 1.23 1.20 0.92  CALCIUM 9.0  --  8.8*  AST 18  --  16  ALT 10*  --  13*  ALKPHOS 46  --  44  BILITOT 0.6  --  0.6   ------------------------------------------------------------------------------------------------------------------ estimated creatinine clearance is 85.5 mL/min (by C-G formula based on Cr of 0.92). ------------------------------------------------------------------------------------------------------------------ No results for input(s): HGBA1C in the last 72 hours. ------------------------------------------------------------------------------------------------------------------ No results for input(s): CHOL, HDL, LDLCALC, TRIG, CHOLHDL, LDLDIRECT in the last 72 hours. ------------------------------------------------------------------------------------------------------------------ No results for input(s): TSH, T4TOTAL, T3FREE, THYROIDAB in the last 72 hours.  Invalid input(s): FREET3 ------------------------------------------------------------------------------------------------------------------ No results for  input(s): VITAMINB12, FOLATE, FERRITIN, TIBC, IRON, RETICCTPCT in the last 72 hours.  Coagulation profile No results for input(s): INR, PROTIME in the last 168 hours.  No results for input(s): DDIMER in the last 72 hours.  Cardiac Enzymes No results for input(s): CKMB, TROPONINI, MYOGLOBIN in the last 168 hours.  Invalid input(s): CK ------------------------------------------------------------------------------------------------------------------ Invalid input(s): POCBNP   Time Spent in minutes   35   SINGH,PRASHANT K M.D on 05/02/2015 at 10:30 AM  Between 7am to 7pm - Pager - [phone]  After 7pm go to [email or web address] - password [facility]  [facility]   Office  [phone]

## 2015-05-02 NOTE — Consult Note (Signed)
Consultation Note Date: 05/02/2015   Patient Name: [name]  DOB: May 01, 1935  MRN: [id number]  Age / Sex: 80 y.o., male   PCP: Crist Infante, MD Referring Physician: Thurnell Lose, MD  Reason for Consultation: Establishing goals of care  Palliative Care Assessment and Plan Summary of Established Goals of Care and Medical Treatment Preferences    Palliative Care Discussion Held Today Contacts/Participants in Discussion: Primary Decision Maker: wife  HCPOA: yes  Met with pt and his wife Had a lengthy conversation with pt and wife and introduced Palliative Medicine resource in addition to oncology, and medical  teams. Pt unfortunately is rapidly nearing a place where he can no longer make an informed decision regarding complex medical decisions such as surgery or DNR. Wife is an excellent nurse, but is overwhelmed . They have 3 children who are a source of support as well as a strong [place] faith. Pt talks a lot about praying for God to put the right people in his path. Years ago they  prepared advanced directives which wife, [name] states she has found, but she could not tell me what they said. [name] states that her husband handled everything including th financial aspects and she has been trying to cope with these issues in addition to her husband's failing health. Pt does know that "my cancer has come back". He does talk about being tired but is not able to move the conversation forward in terms of specific advanced care planning questions. Interestingly pt has a h/o being resuscitated in 1990 "twice" with his MI. He is having trouble with word finding. He is very restless which wife reports is new. She feels he is less confused and speech clearer but not at previous baseline. He denies feeling anxious or worried. He denies pain, HA or head pressure. He is still confused  Code Status/Advance Care Planning:  Full Code continued. Recommended that wife meet with children to start  discussing specific advance care planning issues. Bonnita Levan a MOST form to guide discussion. I did offer to facilitate this which she agreed to think about.  Pt and spouse also would welcome oncology's in put in this regards. They verbalize a positive relationship with Dr. Benay Spice and staff    Psycho-social/Spiritual:   Support System:yes  Desire for further Chaplaincy support: not at this time  Prognosis: < 6 months  Discharge Planning:  Home with Home Health       Chief Complaint/History of Present Illness: Pt is a 80 yo man admitted with worsening confusion, gait disturbance and weakness in setting of recurrent glioblastoma  Primary Diagnoses  Present on Admission:  . Coronary artery disease . Hypertension . Hyperlipidemia . Glioblastoma multiforme of brain . OSA on CPAP . Glioblastoma  Palliative Review of Systems: Pt denies HA, or pain, no n/v or dyspnea. He is confused and unable to to participate in complete ROS I have reviewed the medical record, interviewed the patient and family, and examined the patient. The following aspects are pertinent.  Past Medical History  Diagnosis Date  . Coronary artery disease   . MI, old     INFERIOR  . Hypertension   . Hyperlipidemia   . Chronic venous insufficiency     and varicose veins  . Anterior myocardial infarction 1990    with subsequent PTCA  . Hypercholesterolemia   . Ulcer of right leg   . Obstructive sleep apnea   . Anemia   . Obesity   . Radiation 09/11/14-10/22/14  right temperol lobe 60 gray  . Brain tumor 08/2014   History   Social History  . Marital Status: Married    Spouse Name: [name]  . Number of Children: 3  . Years of Education: College   Occupational History  . [employer] retired    Social History Main Topics  . Smoking status: Never Smoker   . Smokeless tobacco: Never Used  . Alcohol Use: No  . Drug Use: No  . Sexual Activity: Not on file   Other Topics Concern  . None   Social  History Narrative   Patient is married [name]).   Patient has three children.   Patient has a college education.   Patient is retired from [employer]   Patient drinks very little caffeine.   Patient is right-handed.   Hobbies: "Reading the Bible", S[employer] puzzles, active in church            Family History  Problem Relation Age of Onset  . Cancer Sister   . Heart disease    . Diabetes Brother    Scheduled Meds: . aspirin EC  81 mg Oral Daily  . cholecalciferol  1,000 Units Oral Daily  . dexamethasone  6 mg Intravenous 4 times per day  . docusate sodium  100 mg Oral BID  . folic acid  1 mg Oral Daily  . metoprolol tartrate  25 mg Oral BID  . [START ON 05/03/2015] pantoprazole  40 mg Oral Daily  . simvastatin  40 mg Oral q1800  . sodium chloride  3 mL Intravenous Q12H  . [START ON 05/04/2015] sulfamethoxazole-trimethoprim  1 tablet Oral Q M,W,F  . thiamine  100 mg Oral Daily   Continuous Infusions:  PRN Meds:.sodium chloride, acetaminophen **OR** acetaminophen, alum & mag hydroxide-simeth, ondansetron **OR** ondansetron (ZOFRAN) IV, sodium chloride Medications Prior to Admission:  Prior to Admission medications   Medication Sig Start Date End Date Taking? Authorizing Provider  aspirin 81 MG tablet Take 81 mg by mouth daily.   Yes Historical Provider, MD  Cholecalciferol (VITAMIN D-3 PO) Take 1,000 Units by mouth daily.    Yes Historical Provider, MD  metoprolol tartrate (LOPRESSOR) 25 MG tablet TAKE 1 TABLET BY MOUTH TWICE DAILY 12/09/14  Yes Peter M Martinique, MD  mupirocin ointment (BACTROBAN) 2 % Apply 1 application topically daily as needed. For cuts/sores 03/04/15  Yes Historical Provider, MD  nitroGLYCERIN (NITROSTAT) 0.4 MG SL tablet Place 1 tablet (0.4 mg total) under the tongue every 5 (five) minutes as needed. 12/26/12  Yes Peter M Martinique, MD  ondansetron [facility]) 8 MG tablet Take one tablet 1 hour prior to Temodar dose and every 12 hours if needed for nausea 02/05/15  Yes Ladell Pier, MD  polyethylene glycol [facility] / GLYCOLAX) packet Take 17 g by mouth daily as needed for mild constipation.    Yes Historical Provider, MD  simvastatin (ZOCOR) 40 MG tablet Take 40 mg by mouth daily at 6 PM.  09/10/14  Yes Historical Provider, MD  sulfamethoxazole-trimethoprim (BACTRIM DS,SEPTRA DS) 800-160 MG per tablet Take 1 tablet by mouth every Monday, Wednesday, and Friday. For 3 months 02/05/15  Yes Ladell Pier, MD  temozolomide [facility]) 180 MG capsule Take 2 capsules (360 mg total) by mouth daily. Take X 5 days. May take on an empty stomach or at bedtime to decrease nausea & vomiting. 04/03/15  Yes Ladell Pier, MD   No Known Allergies CBC:    Component Value Date/Time   WBC 7.7  05/02/2015 0405   WBC 5.6 04/03/2015 0939   HGB 12.3* 05/02/2015 0405   HGB 12.6* 04/03/2015 0939   HCT 37.0* 05/02/2015 0405   HCT 38.4 04/03/2015 0939   PLT 175 05/02/2015 0405   PLT 202 04/03/2015 0939   MCV 88.1 05/02/2015 0405   MCV 88.9 04/03/2015 0939   NEUTROABS 5.1 05/01/2015 1103   NEUTROABS 3.7 04/03/2015 0939   LYMPHSABS 0.7 05/01/2015 1103   LYMPHSABS 0.8* 04/03/2015 0939   MONOABS 0.6 05/01/2015 1103   MONOABS 0.7 04/03/2015 0939   EOSABS 0.2 05/01/2015 1103   EOSABS 0.3 04/03/2015 0939   BASOSABS 0.0 05/01/2015 1103   BASOSABS 0.1 04/03/2015 0939   Comprehensive Metabolic Panel:    Component Value Date/Time   NA 134* 05/02/2015 0405   NA 140 04/03/2015 0939   K 4.0 05/02/2015 0405   K 4.1 04/03/2015 0939   CL 104 05/02/2015 0405   CO2 21* 05/02/2015 0405   CO2 25 04/03/2015 0939   BUN 16 05/02/2015 0405   BUN 18.1 04/03/2015 0939   CREATININE 0.92 05/02/2015 0405   CREATININE 1.1 04/03/2015 0939   GLUCOSE 182* 05/02/2015 0405   GLUCOSE 99 04/03/2015 0939   CALCIUM 8.8* 05/02/2015 0405   CALCIUM 8.9 04/03/2015 0939   AST 16 05/02/2015 0405   AST 12 04/03/2015 0939   ALT 13* 05/02/2015 0405   ALT 11 04/03/2015 0939   ALKPHOS 44 05/02/2015 0405    ALKPHOS 51 04/03/2015 0939   BILITOT 0.6 05/02/2015 0405   BILITOT 0.74 04/03/2015 0939   PROT 5.6* 05/02/2015 0405   PROT 5.5* 04/03/2015 0939   ALBUMIN 3.4* 05/02/2015 0405   ALBUMIN 3.6 04/03/2015 0939    Physical Exam: Vital Signs: BP 104/53 mmHg  Pulse 58  Temp(Src) 98.7 F (37.1 C) (Oral)  Resp 18  Ht $R'6\' 2"'sJ$  (1.88 m)  Wt 105 kg (231 lb 7.7 oz)  BMI 29.71 kg/m2  SpO2 100% SpO2: SpO2: 100 % O2 Device: O2 Device: Not Delivered O2 Flow Rate:   Intake/output summary:  Intake/Output Summary (Last 24 hours) at 05/02/15 1447 Last data filed at 05/02/15 1338  Gross per 24 hour  Intake    603 ml  Output   1925 ml  Net  -1322 ml   LBM:   Baseline Weight: Weight: 109.856 kg (242 lb 3 oz) Most recent weight: Weight: 105 kg (231 lb 7.7 oz)  Exam Findings:  General: Well nourished older man. He is alert , restless Resp: No work of breathing Musculoskeletal: MAE x 4 Neuro: Right sided facial droop. Expressive aphasia         Palliative Performance Scale: 50%              Additional Data Reviewed: Recent Labs     05/01/15  1103  05/01/15  1119  05/02/15  0405  WBC  6.6   --   7.7  HGB  13.2  13.3  12.3*  PLT  193   --   175  NA  139  138  134*  BUN  $Re'16  18  16  'dvI$ CREATININE  1.23  1.20  0.92     Time In: 1345 Time Out:1500 Time Total: 75 min  Greater than 50%  of this time was spent counseling and coordinating care related to the above assessment and plan.  Signed by: Dory Horn, NP  Dory Horn, NP  05/02/2015, 2:47 PM  Please contact Palliative Medicine Team phone at [phone] for  questions and concerns.

## 2015-05-02 NOTE — Evaluation (Signed)
Physical Therapy Evaluation Patient Details Name: DJANGO NGUYEN MRN: [id number] DOB: 1935-04-04 Today's Date: 05/02/2015   History of Present Illness  80 yo male admitted with AMS, MRI + for glioblastoma recurrence. Hx of R side craniotomy 08/2014, MI, HTN, obesity, SDH on MRI 01/2015  Clinical Impression  On eval, pt required Min guard-Min assist for ambulation distance of ~500 feet. Intermittent stumbling and drifting especially with head turns while walking requiring small amount of assist to correct. Some confusion and slowed processing noted in addition to need for repeated cueing for completion of some tasks. Recommend HHPT vs OP PT depending on progress. Feel OP PT would be most beneficial. Recommend 24 hour supervision/assist as well for safety.     Follow Up Recommendations Supervision/Assistance - 24 hour. Home health PT vs Outpatient PT (depending on progress and pt/wife's decision)    Equipment Recommendations  None recommended by PT    Recommendations for Other Services OT consult     Precautions / Restrictions Precautions Precautions: Fall Restrictions Weight Bearing Restrictions: No      Mobility  Bed Mobility Overal bed mobility: Needs Assistance Bed Mobility: Supine to Sit     Supine to sit: Supervision     General bed mobility comments: supervision for safety  Transfers Overall transfer level: Needs assistance   Transfers: Sit to/from Stand Sit to Stand: Supervision         General transfer comment: supervision for safety  Ambulation/Gait Ambulation/Gait assistance: Min assist;Min guard Ambulation Distance (Feet): 500 Feet Assistive device: None Gait Pattern/deviations: Step-through pattern;Drifts right/left;Staggering left     General Gait Details: Intermittent stumbling especially with head turns. Pt looking around and into room while walking. Tolerated distance well.   Stairs            Wheelchair Mobility    Modified Rankin  (Stroke Patients Only)       Balance Overall balance assessment: Needs assistance           Standing balance-Leahy Scale: Good               High level balance activites: Side stepping;Direction changes;Turns;Head turns;Backward walking High Level Balance Comments: Assist to stabilize intermittently with backwards walking and head turns. Min guard with side stepping to L, R.              Pertinent Vitals/Pain Pain Assessment: No/denies pain    Home Living Family/patient expects to be discharged to:: Private residence Living Arrangements: Spouse/significant other   Type of Home:  (townhome) Home Access: Stairs to enter   [employer] of Steps: 1 Home Layout: One level Home Equipment: Environmental consultant - 2 wheels;Cane - single point      Prior Function Level of Independence: Independent with assistive device(s)               Hand Dominance   Dominant Hand: Right    Extremity/Trunk Assessment   Upper Extremity Assessment: Defer to OT evaluation           Lower Extremity Assessment: Overall WFL for tasks assessed      Cervical / Trunk Assessment: Normal  Communication   Communication: No difficulties  Cognition Arousal/Alertness: Awake/alert Behavior During Therapy: WFL for tasks assessed/performed Overall Cognitive Status: Impaired/Different from baseline Area of Impairment: Attention;Problem solving;Following commands       Following Commands: Follows multi-step commands with increased time     Problem Solving: Requires verbal cues;Slow processing;Requires tactile cues      General Comments  Exercises        Assessment/Plan    PT Assessment Patient needs continued PT services  PT Diagnosis Difficulty walking   PT Problem List Decreased balance;Decreased mobility;Decreased cognition;Decreased safety awareness  PT Treatment Interventions Gait training;Functional mobility training;Therapeutic activities;Therapeutic  exercise;Balance training;Patient/family education   PT Goals (Current goals can be found in the Care Plan section) Acute Rehab PT Goals Patient Stated Goal: none stated PT Goal Formulation: With patient/family Time For Goal Achievement: 05/16/15 Potential to Achieve Goals: Fair    Frequency Min 3X/week   Barriers to discharge        Co-evaluation               End of Session Equipment Utilized During Treatment: Gait belt Activity Tolerance: Patient tolerated treatment well Patient left: in bed;with call bell/phone within reach;with bed alarm set;with family/visitor present           Time: 3664-4034 PT Time Calculation (min) (ACUTE ONLY): 21 min   Charges:   PT Evaluation $Initial PT Evaluation Tier I: 1 Procedure     PT G Codes:        Weston Anna, MPT Pager: [phone]

## 2015-05-02 NOTE — Consult Note (Addendum)
[place] CONSULT NOTE  Patient Care Team: Crist Infante, MD as PCP - General (Internal Medicine) Ladell Pier, MD as Consulting Physician (Oncology)  CHIEF COMPLAINTS/PURPOSE OF CONSULTATION:  Relapsed glioblastoma multiforme  HISTORY OF PRESENTING ILLNESS:  [name] 80 y.o. male is here admitted to the hospital with the prior history of glioblastoma multiforme diagnosed when he presented with a right temporal mass on 08/20/2014. He underwent resection of the right temporal lobe followed by adjuvant chemoradiation with temozolomide started 09/11/2014 complete 10/22/2014. Subsequently he went on adjuvant temozolomide and completed 6 months of therapy. His wife has noticed slow deterioration in his memory as well as subtle changes in his behavior with regards to the way he questions that showed or some slurring of speech. This led to him being brought to the emergency room where he underwent a brain MRI that showed relapse of glioblastoma. The MRI revealed 4.5 x 6.3 x 4 cm area of enhancement which extends to the lateral margin of the temporal horn with subependymal spread of tumor to the contralateral left occipital form. Dr. Learta Codding instructed that the patient be started on IV dexamethasone. He appears to be slightly better compared to when he was admitted to the hospital.  I reviewed her records extensively and collaborated the history with the patient.  SUMMARY OF ONCOLOGIC HISTORY:   Glioblastoma multiforme of brain   08/20/2014 Initial Diagnosis Glioblastoma multiforme of brain   MEDICAL HISTORY:  Past Medical History  Diagnosis Date  . Coronary artery disease   . MI, old     INFERIOR  . Hypertension   . Hyperlipidemia   . Chronic venous insufficiency     and varicose veins  . Anterior myocardial infarction 1990    with subsequent PTCA  . Hypercholesterolemia   . Ulcer of right leg   . Obstructive sleep apnea   . Anemia   . Obesity   . Radiation  09/11/14-10/22/14    right temperol lobe 60 gray  . Brain tumor 08/2014    SURGICAL HISTORY: Past Surgical History  Procedure Laterality Date  . Cardiac catheterization  06/30/2003    EF 50%  . Coronary artery bypass graft  01/2003    X6. LIMA GRAFT TO THE LDA, FREE RADICAL GRAFT TO THE OM1, SEQUENTIAL SAPHENOUS VEIN GRAFT TO THE FIRST AND SECOND DIAGONAL BRANCES, AND A SEQUENTIAL  VEIN GRAFT TO THE PDA AND POSTERIOR LATERAL BRANCHES OF THE RIGHT CORONARY  . Coronary angioplasty  06/2003    POSTERIOR LATERAL BRANCH  . Tonsillectomy and adenoidectomy    . Cardiovascular stress test  01/23/2009    EF 44%  . Craniotomy Right 08/20/2014    Procedure: CRANIOTOMY TUMOR EXCISION w/ BrainLab;  Surgeon: Newman Pies, MD;  Location: [facility];  Service: Neurosurgery;  Laterality: Right;  Right Craniotomy with brain lab for tumor    SOCIAL HISTORY: History   Social History  . Marital Status: Married    Spouse Name: Izora Gala  . Number of Children: 3  . Years of Education: College   Occupational History  . [employer] retired    Social History Main Topics  . Smoking status: Never Smoker   . Smokeless tobacco: Never Used  . Alcohol Use: No  . Drug Use: No  . Sexual Activity: Not on file   Other Topics Concern  . Not on file   Social History Narrative   Patient is married Izora Gala).   Patient has three children.   Patient has a  college education.   Patient is retired from [employer]   Patient drinks very little caffeine.   Patient is right-handed.   Hobbies: "Reading the Bible", [employer] puzzles, active in church             FAMILY HISTORY: Family History  Problem Relation Age of Onset  . Cancer Sister   . Heart disease    . Diabetes Brother     ALLERGIES:  has No Known Allergies.  MEDICATIONS:  Current Facility-Administered Medications  Medication Dose Route Frequency Provider Last Rate Last Dose  . 0.9 %  sodium chloride infusion  250 mL Intravenous PRN Samella Parr, NP      .  acetaminophen (TYLENOL) tablet 650 mg  650 mg Oral Q6H PRN Samella Parr, NP       Or  . acetaminophen (TYLENOL) suppository 650 mg  650 mg Rectal Q6H PRN Samella Parr, NP      . alum & mag hydroxide-simeth (MAALOX/MYLANTA) 200-200-20 MG/5ML suspension 30 mL  30 mL Oral Q6H PRN Samella Parr, NP      . aspirin EC tablet 81 mg  81 mg Oral Daily Samella Parr, NP      . cholecalciferol (VITAMIN D) tablet 1,000 Units  1,000 Units Oral Daily Samella Parr, NP      . dexamethasone (DECADRON) injection 6 mg  6 mg Intravenous 4 times per day Samella Parr, NP   6 mg at 05/02/15 3149  . docusate sodium (COLACE) capsule 100 mg  100 mg Oral BID Samella Parr, NP   100 mg at 70/26/37 8588  . folic acid (FOLVITE) tablet 1 mg  1 mg Oral Daily Albertine Patricia, MD   1 mg at 05/01/15 2058  . metoprolol tartrate (LOPRESSOR) tablet 25 mg  25 mg Oral BID Samella Parr, NP   25 mg at 05/01/15 2128  . ondansetron (ZOFRAN) tablet 4 mg  4 mg Oral Q6H PRN Samella Parr, NP       Or  . ondansetron [facility]) injection 4 mg  4 mg Intravenous Q6H PRN Samella Parr, NP      . pantoprazole (PROTONIX) injection 40 mg  40 mg Intravenous Q12H Samella Parr, NP   40 mg at 05/01/15 2125  . simvastatin (ZOCOR) tablet 40 mg  40 mg Oral q1800 Samella Parr, NP   40 mg at 05/01/15 1809  . sodium chloride 0.9 % injection 3 mL  3 mL Intravenous Q12H Samella Parr, NP   3 mL at 05/01/15 2128  . sodium chloride 0.9 % injection 3 mL  3 mL Intravenous PRN Samella Parr, NP      . Derrill Memo ON 05/04/2015] sulfamethoxazole-trimethoprim (BACTRIM DS,SEPTRA DS) 800-160 MG per tablet 1 tablet  1 tablet Oral Q M,W,F Samella Parr, NP      . thiamine (VITAMIN B-1) tablet 100 mg  100 mg Oral Daily Albertine Patricia, MD   100 mg at 05/01/15 2058    REVIEW OF SYSTEMS:   Constitutional: Denies fevers, chills or abnormal night sweats Eyes: Denies blurriness of vision, double vision or watery eyes Ears, nose, mouth,  throat, and face: Denies mucositis or sore throat Respiratory: Denies cough, dyspnea or wheezes Cardiovascular: Denies palpitation, chest discomfort or lower extremity swelling Gastrointestinal:  Denies nausea, heartburn or change in bowel habits Skin: Denies abnormal skin rashes Lymphatics: Denies new lymphadenopathy or easy bruising Neurological: Slight slurring of speech, moves all  extremities appropriately, sensations intact in his extremities. Behavioral/Psych: Mood is stable, no new changes  All other systems were reviewed with the patient and are negative.  PHYSICAL EXAMINATION: ECOG PERFORMANCE STATUS: 3 - Symptomatic, >50% confined to bed  Filed Vitals:   05/02/15 0355  BP: 117/63  Pulse: 54  Temp: 98.4 F (36.9 C)  Resp: 16   Filed Weights   05/01/15 1112 05/01/15 1731  Weight: 242 lb 3 oz (109.856 kg) 231 lb 7.7 oz (105 kg)    GENERAL:alert, no distress and comfortable SKIN: skin color, texture, turgor are normal, no rashes or significant lesions EYES: normal, conjunctiva are pink and non-injected, sclera clear OROPHARYNX:no exudate, no erythema and lips, buccal mucosa, and tongue normal  NECK: supple, thyroid normal size, non-tender, without nodularity LYMPH:  no palpable lymphadenopathy in the cervical, axillary or inguinal LUNGS: clear to auscultation and percussion with normal breathing effort HEART: regular rate & rhythm and no murmurs and no lower extremity edema ABDOMEN:abdomen soft, non-tender and normal bowel sounds Musculoskeletal:no cyanosis of digits and no clubbing  PSYCH: alert & oriented x 3 with fluent speech NEURO: Slurred speech, no extremity weakness  LABORATORY DATA:  I have reviewed the data as listed Lab Results  Component Value Date   WBC 7.7 05/02/2015   HGB 12.3* 05/02/2015   HCT 37.0* 05/02/2015   MCV 88.1 05/02/2015   PLT 175 05/02/2015   Lab Results  Component Value Date   NA 134* 05/02/2015   K 4.0 05/02/2015   CL 104  05/02/2015   CO2 21* 05/02/2015    RADIOGRAPHIC STUDIES: I have personally reviewed the radiological reports and agreed with the findings in the report. MRI brain results are summarized as above  ASSESSMENT AND PLAN:  1. Relapsed glioblastoma multiforme: I recommended neurosurgery consultation to discuss whether there are any options for surgical resection.  Based on my discussion with Dr. Learta Codding, patient may be a candidate for Avastin is not a surgical candidate.  Since the tumor is relapsed in the previously radiated resection cavity, I did not believe he would be a radiation candidate however we will discuss this with radiation oncology next week in the brain tumor board.   2. Continue IV Decadron  3. Patient and his wife understand that the prognosis is poor for relapsed glioblastoma and were interested in meeting with palliative care.  Dr.Sherill will see him on Monday to finalize the plan based on neuro-surgery evaluation and their recommendations.  All questions were answered. The patient knows to call the clinic with any problems, questions or concerns.    Rulon Eisenmenger, MD 8:52 AM

## 2015-05-02 NOTE — Consult Note (Signed)
Reason for Consult:recurrent GBM right temporal lobe Referring Physician: singh, p  [name] is an 80 y.o. male.  HPI: whom underwent a craniotomy in 9/15 for a temporal GBM resection. Has been treated with radiation, and with chemotherapy. Currently admitted for increasing gait disturbance, confusion, change in speech. MRI revealed recurrent tumor in right temporal lobe.  Past Medical History  Diagnosis Date  . Coronary artery disease   . MI, old     INFERIOR  . Hypertension   . Hyperlipidemia   . Chronic venous insufficiency     and varicose veins  . Anterior myocardial infarction 1990    with subsequent PTCA  . Hypercholesterolemia   . Ulcer of right leg   . Obstructive sleep apnea   . Anemia   . Obesity   . Radiation 09/11/14-10/22/14    right temperol lobe 60 gray  . Brain tumor 08/2014    Past Surgical History  Procedure Laterality Date  . Cardiac catheterization  06/30/2003    EF 50%  . Coronary artery bypass graft  01/2003    X6. LIMA GRAFT TO THE LDA, FREE RADICAL GRAFT TO THE OM1, SEQUENTIAL SAPHENOUS VEIN GRAFT TO THE FIRST AND SECOND DIAGONAL BRANCES, AND A SEQUENTIAL  VEIN GRAFT TO THE PDA AND POSTERIOR LATERAL BRANCHES OF THE RIGHT CORONARY  . Coronary angioplasty  06/2003    POSTERIOR LATERAL BRANCH  . Tonsillectomy and adenoidectomy    . Cardiovascular stress test  01/23/2009    EF 44%  . Craniotomy Right 08/20/2014    Procedure: CRANIOTOMY TUMOR EXCISION w/ BrainLab;  Surgeon: Newman Pies, MD;  Location: [facility];  Service: Neurosurgery;  Laterality: Right;  Right Craniotomy with brain lab for tumor    Family History  Problem Relation Age of Onset  . Cancer Sister   . Heart disease    . Diabetes Brother     Social History:  reports that he has never smoked. He has never used smokeless tobacco. He reports that he does not drink alcohol or use illicit drugs.  Allergies: No Known Allergies  Medications: I have reviewed the patient's current  medications.  Results for orders placed or performed during the hospital encounter of 05/01/15 (from the past 48 hour(s))  CBC     Status: Abnormal   Collection Time: 05/01/15 11:03 AM  Result Value Ref Range   WBC 6.6 4.0 - 10.5 K/uL   RBC 4.38 4.22 - 5.81 MIL/uL   Hemoglobin 13.2 13.0 - 17.0 g/dL   HCT 38.7 (L) 39.0 - 52.0 %   MCV 88.4 78.0 - 100.0 fL   MCH 30.1 26.0 - 34.0 pg   MCHC 34.1 30.0 - 36.0 g/dL   RDW 14.1 11.5 - 15.5 %   Platelets 193 150 - 400 K/uL  Differential     Status: Abnormal   Collection Time: 05/01/15 11:03 AM  Result Value Ref Range   Neutrophils Relative % 77 43 - 77 %   Neutro Abs 5.1 1.7 - 7.7 K/uL   Lymphocytes Relative 10 (L) 12 - 46 %   Lymphs Abs 0.7 0.7 - 4.0 K/uL   Monocytes Relative 9 3 - 12 %   Monocytes Absolute 0.6 0.1 - 1.0 K/uL   Eosinophils Relative 3 0 - 5 %   Eosinophils Absolute 0.2 0.0 - 0.7 K/uL   Basophils Relative 1 0 - 1 %   Basophils Absolute 0.0 0.0 - 0.1 K/uL  Comprehensive metabolic panel     Status:  Abnormal   Collection Time: 05/01/15 11:03 AM  Result Value Ref Range   Sodium 139 135 - 145 mmol/L   Potassium 3.8 3.5 - 5.1 mmol/L   Chloride 107 101 - 111 mmol/L   CO2 23 22 - 32 mmol/L   Glucose, Bld 104 (H) 65 - 99 mg/dL   BUN 16 6 - 20 mg/dL   Creatinine, Ser 1.23 0.61 - 1.24 mg/dL   Calcium 9.0 8.9 - 10.3 mg/dL   Total Protein 5.9 (L) 6.5 - 8.1 g/dL   Albumin 3.7 3.5 - 5.0 g/dL   AST 18 15 - 41 U/L   ALT 10 (L) 17 - 63 U/L   Alkaline Phosphatase 46 38 - 126 U/L   Total Bilirubin 0.6 0.3 - 1.2 mg/dL   GFR calc non Af Amer 54 (L) >60 mL/min   GFR calc Af Amer >60 >60 mL/min    Comment: (NOTE) The eGFR has been calculated using the CKD EPI equation. This calculation has not been validated in all clinical situations. eGFR's persistently <60 mL/min signify possible Chronic Kidney Disease.    Anion gap 9 5 - 15  I-stat troponin, ED (not at [facility], [facility])     Status: None   Collection Time: 05/01/15 11:17 AM  Result  Value Ref Range   Troponin i, poc 0.01 0.00 - 0.08 ng/mL   Comment 3            Comment: Due to the release kinetics of cTnI, a negative result within the first hours of the onset of symptoms does not rule out myocardial infarction with certainty. If myocardial infarction is still suspected, repeat the test at appropriate intervals.   I-Stat Chem 8, ED  (not at [facility], [facility])     Status: Abnormal   Collection Time: 05/01/15 11:19 AM  Result Value Ref Range   Sodium 138 135 - 145 mmol/L   Potassium 3.7 3.5 - 5.1 mmol/L   Chloride 103 101 - 111 mmol/L   BUN 18 6 - 20 mg/dL   Creatinine, Ser 1.20 0.61 - 1.24 mg/dL   Glucose, Bld 101 (H) 65 - 99 mg/dL   Calcium, Ion 1.19 1.13 - 1.30 mmol/L   TCO2 19 0 - 100 mmol/L   Hemoglobin 13.3 13.0 - 17.0 g/dL   HCT 39.0 39.0 - 52.0 %  Urinalysis, Routine w reflex microscopic     Status: None   Collection Time: 05/01/15  2:09 PM  Result Value Ref Range   Color, Urine YELLOW YELLOW   APPearance CLEAR CLEAR   Specific Gravity, Urine 1.025 1.005 - 1.030   pH 6.0 5.0 - 8.0   Glucose, UA NEGATIVE NEGATIVE mg/dL   Hgb urine dipstick NEGATIVE NEGATIVE   Bilirubin Urine NEGATIVE NEGATIVE   Ketones, ur NEGATIVE NEGATIVE mg/dL   Protein, ur NEGATIVE NEGATIVE mg/dL   Urobilinogen, UA 1.0 0.0 - 1.0 mg/dL   Nitrite NEGATIVE NEGATIVE   Leukocytes, UA NEGATIVE NEGATIVE    Comment: MICROSCOPIC NOT DONE ON URINES WITH NEGATIVE PROTEIN, BLOOD, LEUKOCYTES, NITRITE, OR GLUCOSE <1000 mg/dL.  Comprehensive metabolic panel     Status: Abnormal   Collection Time: 05/02/15  4:05 AM  Result Value Ref Range   Sodium 134 (L) 135 - 145 mmol/L   Potassium 4.0 3.5 - 5.1 mmol/L   Chloride 104 101 - 111 mmol/L   CO2 21 (L) 22 - 32 mmol/L   Glucose, Bld 182 (H) 65 - 99 mg/dL   BUN 16 6 - 20  mg/dL   Creatinine, Ser 0.92 0.61 - 1.24 mg/dL   Calcium 8.8 (L) 8.9 - 10.3 mg/dL   Total Protein 5.6 (L) 6.5 - 8.1 g/dL   Albumin 3.4 (L) 3.5 - 5.0 g/dL   AST 16 15 - 41 U/L    ALT 13 (L) 17 - 63 U/L   Alkaline Phosphatase 44 38 - 126 U/L   Total Bilirubin 0.6 0.3 - 1.2 mg/dL   GFR calc non Af Amer >60 >60 mL/min   GFR calc Af Amer >60 >60 mL/min    Comment: (NOTE) The eGFR has been calculated using the CKD EPI equation. This calculation has not been validated in all clinical situations. eGFR's persistently <60 mL/min signify possible Chronic Kidney Disease.    Anion gap 9 5 - 15  CBC     Status: Abnormal   Collection Time: 05/02/15  4:05 AM  Result Value Ref Range   WBC 7.7 4.0 - 10.5 K/uL   RBC 4.20 (L) 4.22 - 5.81 MIL/uL   Hemoglobin 12.3 (L) 13.0 - 17.0 g/dL   HCT 37.0 (L) 39.0 - 52.0 %   MCV 88.1 78.0 - 100.0 fL   MCH 29.3 26.0 - 34.0 pg   MCHC 33.2 30.0 - 36.0 g/dL   RDW 13.8 11.5 - 15.5 %   Platelets 175 150 - 400 K/uL    Mr Jeri Cos Wo Contrast  05/01/2015   CLINICAL DATA:  Slurred speech, confusion, and unsteady gait for 1 week. History of GBM.  EXAM: MRI HEAD WITHOUT AND WITH CONTRAST  TECHNIQUE: Multiplanar, multiecho pulse sequences of the brain and surrounding structures were obtained without and with intravenous contrast.  CONTRAST:  MultiHance 20 mL.  COMPARISON:  01/29/2015 most recent.  Preoperative scan 08/19/2014.  FINDINGS: The patient is status post craniotomy for subtotal glioblastoma multiforme removal.  There is significant tumor recurrence now identified at the surgical site. Previously identified only slightly enhancing tumor bad now displays marked increase in size as well as irregular thick-walled enhancement area of tumor recurrence which is accompanied by necrosis/ encephalomalacia centrally is 45 x 63 x 40 mm. Enhancement extends to the lateral margin of the temporal horn. In addition, there is subependymal spread of tumor to the contralateral LEFT occipital horn. Marked surrounding vasogenic edema extends throughout the entire RIGHT hemisphere, and results and RIGHT to LEFT shift of approximately 5 mm.  Generalized atrophy. Minor  white matter disease. Previously identified RIGHT frontal extra-axial fluid collection has shown significant improvement since the previous exam, now measuring 8 mm thick as opposed to 19 mm thick previously. Only minor mass effect on the RIGHT frontal cortex, not felt to be significant given the degree of atrophy.  IMPRESSION: Significant tumor recurrence now identified at the site of previous RIGHT temporal lobectomy. This measures 45 x 63 x 40 mm. Extensive vasogenic edema throughout the RIGHT hemisphere with 5 mm right-to-left shift.  Subependymal spread of tumor to the contralateral LEFT occipital horn.  Improved right-sided subdural hematoma. No significant mass effect at this time.   Electronically Signed   By: Rolla Flatten M.D.   On: 05/01/2015 14:01    Review of Systems  Constitutional: Negative.   HENT: Negative.   Respiratory: Negative.   Gastrointestinal: Negative.   Genitourinary: Negative.   Skin: Negative.   Neurological: Positive for speech change.       Confused. Unsteady gait  Psychiatric/Behavioral: Negative.    Blood pressure 104/53, pulse 58, temperature 98.7 F (37.1 C), temperature source  Oral, resp. rate 18, height $RemoveBe'6\' 2"'[id number]$  (1.88 m), weight 105 kg (231 lb 7.7 oz), SpO2 100 %. Physical Exam  Constitutional: He is oriented to person, place, and time. He appears well-developed and well-nourished. No distress.  HENT:  Craniotomy scar is well healed.  Eyes: Conjunctivae and EOM are normal. Pupils are equal, round, and reactive to light.  Neck: Normal range of motion. Neck supple.  Cardiovascular: Normal rate, regular rhythm and normal heart sounds.   Respiratory: Effort normal and breath sounds normal.  GI: Soft. Bowel sounds are normal.  Musculoskeletal: Normal range of motion.  Neurological: He is alert and oriented to person, place, and time. He has normal strength. A cranial nerve deficit is present. Coordination and gait abnormal. GCS eye subscore is 4. GCS verbal  subscore is 5. GCS motor subscore is 6. He displays no Babinski's sign on the right side. He displays no Babinski's sign on the left side.  Dysarthric Confused Mentation not good enough for sensory exam Following some commands. Memory quite poor with immediate recall. Trying to get out of the bed.    Assessment/Plan: Do not recommend repeat surgery. While it can be done, I do not believe this will in any meaningful way improve his life. Family not present when I came today. Will alert Dr. [name]' to his presence next week. If you have questions please call [phone]  Alphonsa Brickle L 05/02/2015, 5:18 PM

## 2015-05-02 NOTE — Progress Notes (Signed)
Pt refused nocturnal CPAP.  Pt did not keep CPAP on last night per RN and NT.  Pt is very confused and states that he doubts he keeps it on.

## 2015-05-02 NOTE — Evaluation (Signed)
Occupational Therapy Evaluation Patient Details Name: [name] MRN: [id number] DOB: [birth year] Today's Date: 05/02/2015    History of Present Illness 80 yo male admitted with AMS, MRI + for glioblastoma recurrence. Hx of R side craniotomy 08/2014, MI, HTN, obesity, SDH on MRI 01/2015   Clinical Impression   Pt requires min assist overall for safety and increased time to complete tasks. No family present for session. He did demonstrate decreased problem solving ability and required cues for completing tasks at times. He displayed word finding difficulty at times also. Will follow on acute to progress ADL independence and safety for return home with wife. Recommend 24/7 assist for safety. Would possibly benefit from Speech consult.     Follow Up Recommendations  Home health OT;Supervision/Assistance - 24 hour    Equipment Recommendations  None recommended by OT    Recommendations for Other Services Speech consult     Precautions / Restrictions Precautions Precautions: Fall Restrictions Weight Bearing Restrictions: No      Mobility Bed Mobility Overal bed mobility: Needs Assistance Bed Mobility: Supine to Sit     Supine to sit: Supervision     General bed mobility comments: pt moves quickly.  Transfers Overall transfer level: Needs assistance Equipment used: None Transfers: Sit to/from Stand Sit to Stand: Min guard         General transfer comment: guard for safety as pt stood quickly. min guard from lower commode.    Balance Overall balance assessment: Needs assistance           Standing balance-Leahy Scale: Good                           ADL Overall ADL's : Needs assistance/impaired Eating/Feeding: Independent;Sitting   Grooming: Wash/dry hands;Min guard;Standing   Upper Body Bathing: Set up;Sitting   Lower Body Bathing: Minimal assistance;Sit to/from stand   Upper Body Dressing : Set up;Sitting   Lower Body Dressing: Minimal  assistance;Sit to/from stand   Toilet Transfer: Minimal assistance;Ambulation;Comfort height toilet;Grab bars   Toileting- Clothing Manipulation and Hygiene: Minimal assistance;Sit to/from stand         General ADL Comments: Pt with noted unsteadiness at times during session especially with turns and distractions. Pt had difficulty problem solving how to turn off the water at the sink after washing his hands. He knew the MD was seeing him at this current location but not able to state that he is in the hospital. Pt with difficulty expressing thoughts frequently during session also and had word finding difficulty.      Vision     Perception     Praxis      Pertinent Vitals/Pain Pain Assessment: No/denies pain     Hand Dominance Right   Extremity/Trunk Assessment Upper Extremity Assessment Upper Extremity Assessment: Overall WFL for tasks assessed      Cervical / Trunk Assessment Cervical / Trunk Assessment: Normal   Communication Communication Communication: No difficulties   Cognition Arousal/Alertness: Awake/alert Behavior During Therapy: WFL for tasks assessed/performed Overall Cognitive Status: Impaired/Different from baseline Area of Impairment: Attention;Problem solving;Following commands;Orientation Orientation Level: Place     Following Commands: Follows one step commands with increased time     Problem Solving: Slow processing;Requires verbal cues;Requires tactile cues     General Comments       Exercises       Shoulder Instructions      Home Living Family/patient expects to be discharged to:: Private residence  Living Arrangements: Spouse/significant other   Type of Home:  (townhome) Home Access: Stairs to enter [employer] of Steps: 1   Home Layout: One level     Bathroom Shower/Tub: Occupational psychologist: Standard     Home Equipment: Environmental consultant - 2 wheels;Cane - single point;Grab bars - tub/shower           Prior Functioning/Environment Level of Independence: Independent with assistive device(s)             OT Diagnosis: Generalized weakness   OT Problem List: Decreased strength;Decreased knowledge of use of DME or AE;Decreased cognition   OT Treatment/Interventions: Patient/family education;Self-care/ADL training;DME and/or AE instruction;Cognitive remediation/compensation    OT Goals(Current goals can be found in the care plan section) Acute Rehab OT Goals Patient Stated Goal: none stated OT Goal Formulation: With patient Time For Goal Achievement: 05/16/15 Potential to Achieve Goals: Good  OT Frequency: Min 2X/week   Barriers to D/C:            Co-evaluation              End of Session Equipment Utilized During Treatment: Gait belt  Activity Tolerance: Patient tolerated treatment well Patient left: in bed;with call bell/phone within reach;with bed alarm set   Time: 1225-1247 OT Time Calculation (min): 22 min Charges:  OT General Charges $OT Visit: 1 Procedure OT Evaluation $Initial OT Evaluation Tier I: 1 Procedure G-Codes:    Jules Schick  [phone] 05/02/2015, 1:19 PM

## 2015-05-02 NOTE — Progress Notes (Signed)
Medicates with Haldol for agitation. Will cont to monitor pt.

## 2015-05-03 DIAGNOSIS — I10 Essential (primary) hypertension: Secondary | ICD-10-CM

## 2015-05-03 DIAGNOSIS — E785 Hyperlipidemia, unspecified: Secondary | ICD-10-CM

## 2015-05-03 DIAGNOSIS — I2510 Atherosclerotic heart disease of native coronary artery without angina pectoris: Secondary | ICD-10-CM

## 2015-05-03 LAB — CBC
HCT: 42.1 % (ref 39.0–52.0)
HEMOGLOBIN: 14.4 g/dL (ref 13.0–17.0)
MCH: 29.8 pg (ref 26.0–34.0)
MCHC: 34.2 g/dL (ref 30.0–36.0)
MCV: 87.2 fL (ref 78.0–100.0)
Platelets: 201 10*3/uL (ref 150–400)
RBC: 4.83 MIL/uL (ref 4.22–5.81)
RDW: 14 % (ref 11.5–15.5)
WBC: 17.4 10*3/uL — ABNORMAL HIGH (ref 4.0–10.5)

## 2015-05-03 LAB — BASIC METABOLIC PANEL
Anion gap: 7 (ref 5–15)
BUN: 18 mg/dL (ref 6–20)
CO2: 25 mmol/L (ref 22–32)
Calcium: 9.3 mg/dL (ref 8.9–10.3)
Chloride: 108 mmol/L (ref 101–111)
Creatinine, Ser: 0.9 mg/dL (ref 0.61–1.24)
GLUCOSE: 135 mg/dL — AB (ref 65–99)
POTASSIUM: 4.2 mmol/L (ref 3.5–5.1)
Sodium: 140 mmol/L (ref 135–145)

## 2015-05-03 NOTE — Progress Notes (Signed)
Pt adamantly refused nocturnal CPAP.  RT will continue to monitor as needed.

## 2015-05-03 NOTE — Progress Notes (Signed)
Haldol given for agitation. Will cont to monitor patient.

## 2015-05-03 NOTE — Progress Notes (Signed)
Pt continues to try to get oob, states he needs to go home. Ativan given and bed alarm continues to be on. Will cont to monitor patient.

## 2015-05-03 NOTE — Progress Notes (Signed)
Pt now sleeping after receiving a dose of  Ativan for restlessness and attempting to get oob. Will cont to monitor.

## 2015-05-03 NOTE — Progress Notes (Signed)
PATIENT DETAILS Name: [name] Age: 80 y.o. Sex: male Date of Birth: [birth year] Admit Date: 05/01/2015 Admitting Physician Albertine Patricia, MD IOE:VOJJKK,XFGH A, MD  Brief Narrative: 79 y.o. male  with the prior history of glioblastoma multiforme diagnosed when he presented with a right temporal mass on 08/20/2014. He underwent resection of the right temporal lobe followed by adjuvant chemoradiation with temozolomide started 09/11/2014 complete 10/22/2014. Subsequently he went on adjuvant temozolomide and completed 6 months of therapy. He was brought to the hospital for evaluation of slow deterioration in his memory as well as subtle changes in his behavior with regards to the way he questions that showed or some slurring of speech. MRI brain on admission showed relapse of glioblastoma. Patient was started on Decadron, and admitted for further eval and treatment.  Subjective: Mildly confused today. 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 Has improved on IV Decadron, now oriented 3, no problems with swallowing food or liquids. No focal neurological deficits. Oncology, Neurosurgery and palliative care following. Await further recommendations.  Active Problems: Obstructive sleep apnea: Continue CPAP.  WEX:HBZJIRCV aspirin, beta blocker and statin for secondary prevention.No chest pain.  Essential hypertension: Controlled with metoprolol, follow.  Dyslipidemia: Continue statin  GERD:Continue PPI   Chronic Bactrim Rx ( chemo prophylaxis):continue  Disposition: Remain inpatient  Antimicrobial agents  See below  Anti-infectives    Start     Dose/Rate Route Frequency Ordered Stop   05/04/15 1000   sulfamethoxazole-trimethoprim (BACTRIM DS,SEPTRA DS) 800-160 MG per tablet 1 tablet     1 tablet Oral Every M-W-F 05/01/15 1654        DVT Prophylaxis:  SCD's  Code Status: Full code   Family Communication None at bedside  Procedures: None  CONSULTS:  hematology/oncology and Neurosurgery, Palliative  Time spent 25 minutes-Greater than 50% of this time was spent in counseling, explanation of diagnosis, planning of further management, and coordination of care.  MEDICATIONS: Scheduled Meds: . aspirin EC  81 mg Oral Daily  . cholecalciferol  1,000 Units Oral Daily  . dexamethasone  6 mg Intravenous 4 times per day  . docusate sodium  100 mg Oral BID  . folic acid  1 mg Oral Daily  . metoprolol tartrate  25 mg Oral BID  . pantoprazole  40 mg Oral Daily  . simvastatin  40 mg Oral q1800  . sodium chloride  3 mL Intravenous Q12H  . [START ON 05/04/2015] sulfamethoxazole-trimethoprim  1 tablet Oral Q M,W,F  . thiamine  100 mg Oral Daily   Continuous Infusions:  PRN Meds:.sodium chloride, acetaminophen **OR** acetaminophen, alum & mag hydroxide-simeth, haloperidol lactate, LORazepam, ondansetron **OR** ondansetron (ZOFRAN) IV, sodium chloride    PHYSICAL EXAM: Vital signs in last 24 hours: Filed Vitals:   05/02/15 1337 05/02/15 1957 05/03/15 0150 05/03/15 0630  BP: 104/53 134/94  108/86  Pulse: 58 66  48  Temp: 98.7 F (37.1 C) 97.5 F (36.4 C)  98 F (36.7 C)  TempSrc: Oral Oral  Oral  Resp: 18 16  16   Height:      Weight:   104.5 kg (230 lb 6.1 oz)   SpO2: 100% 98%  98%    Weight change: -5.356 kg (-11 lb 12.9 oz)  Filed Weights   05/01/15 1112 05/01/15 1731 05/03/15 0150  Weight: 109.856 kg (242 lb 3 oz) 105 kg (231 lb 7.7 oz) 104.5 kg (230 lb 6.1 oz)   Body mass index is 29.57 kg/(m^2).   Gen Exam: Awake, but mildly confused Neck: Supple, No JVD.   Chest: B/L Clear.   CVS: S1 S2 Regular, no murmurs.  Abdomen: soft, BS +, non tender, non distended.    Extremities: no edema, lower extremities warm to touch. Neurologic: Non Focal.   Skin: No Rash.   Wounds: N/A.    Intake/Output from previous day:  Intake/Output Summary (Last 24 hours) at 05/03/15 1224 Last data filed at 05/03/15 1027  Gross per 24 hour  Intake   1080 ml  Output   1500 ml  Net   -420 ml     LAB RESULTS: CBC  Recent Labs Lab 05/01/15 1103 05/01/15 1119 05/02/15 0405 05/03/15 0914  WBC 6.6  --  7.7 17.4*  HGB 13.2 13.3 12.3* 14.4  HCT 38.7* 39.0 37.0* 42.1  PLT 193  --  175 201  MCV 88.4  --  88.1 87.2  MCH 30.1  --  29.3 29.8  MCHC 34.1  --  33.2 34.2  RDW 14.1  --  13.8 14.0  LYMPHSABS 0.7  --   --   --   MONOABS 0.6  --   --   --   EOSABS 0.2  --   --   --   BASOSABS 0.0  --   --   --     Chemistries   Recent Labs Lab 05/01/15 1103 05/01/15 1119 05/02/15 0405 05/03/15 0914  NA 139 138 134* 140  K 3.8 3.7 4.0 4.2  CL 107 103 104 108  CO2 23  --  21* 25  GLUCOSE 104* 101* 182* 135*  BUN 16 18 16 18   CREATININE 1.23 1.20 0.92 0.90  CALCIUM 9.0  --  8.8* 9.3    CBG: No results for input(s): GLUCAP in the last 168 hours.  GFR Estimated Creatinine Clearance: 87.2 mL/min (by C-G formula based on Cr of 0.9).  Coagulation profile No results for input(s): INR, PROTIME in the last 168 hours.  Cardiac Enzymes No results for input(s): CKMB, TROPONINI, MYOGLOBIN in the last 168 hours.  Invalid input(s): CK  Invalid input(s): POCBNP No results for input(s): DDIMER in the last 72 hours. No results for input(s): HGBA1C in the last 72 hours. No results for input(s): CHOL, HDL, LDLCALC, TRIG, CHOLHDL, LDLDIRECT in the last 72 hours. No results for input(s): TSH, T4TOTAL, T3FREE, THYROIDAB in the last 72 hours.  Invalid input(s): FREET3 No results for input(s): VITAMINB12, FOLATE, FERRITIN, TIBC, IRON, RETICCTPCT in the last 72 hours. No results for input(s): LIPASE, AMYLASE in the last 72 hours.  Urine Studies No results for  input(s): UHGB, CRYS in the last 72 hours.  Invalid input(s): UACOL, UAPR, USPG, UPH, UTP, UGL, UKET, UBIL, UNIT, UROB, ULEU, UEPI, UWBC, URBC, UBAC, CAST, UCOM, BILUA  MICROBIOLOGY: No results found for this or any previous visit (from the past 240 hour(s)).  RADIOLOGY STUDIES/RESULTS: Mr Kizzie Fantasia Contrast  05/01/2015   CLINICAL DATA:  Slurred speech, confusion, and unsteady gait for 1 week. History of GBM.  EXAM: MRI HEAD WITHOUT AND WITH CONTRAST  TECHNIQUE: Multiplanar, multiecho pulse sequences of the brain and surrounding structures were obtained without and with intravenous contrast.  CONTRAST:  MultiHance 20 mL.  COMPARISON:  01/29/2015 most recent.  Preoperative  scan 08/19/2014.  FINDINGS: The patient is status post craniotomy for subtotal glioblastoma multiforme removal.  There is significant tumor recurrence now identified at the surgical site. Previously identified only slightly enhancing tumor bad now displays marked increase in size as well as irregular thick-walled enhancement area of tumor recurrence which is accompanied by necrosis/ encephalomalacia centrally is 45 x 63 x 40 mm. Enhancement extends to the lateral margin of the temporal horn. In addition, there is subependymal spread of tumor to the contralateral LEFT occipital horn. Marked surrounding vasogenic edema extends throughout the entire RIGHT hemisphere, and results and RIGHT to LEFT shift of approximately 5 mm.  Generalized atrophy. Minor white matter disease. Previously identified RIGHT frontal extra-axial fluid collection has shown significant improvement since the previous exam, now measuring 8 mm thick as opposed to 19 mm thick previously. Only minor mass effect on the RIGHT frontal cortex, not felt to be significant given the degree of atrophy.  IMPRESSION: Significant tumor recurrence now identified at the site of previous RIGHT temporal lobectomy. This measures 45 x 63 x 40 mm. Extensive vasogenic edema throughout the  RIGHT hemisphere with 5 mm right-to-left shift.  Subependymal spread of tumor to the contralateral LEFT occipital horn.  Improved right-sided subdural hematoma. No significant mass effect at this time.   Electronically Signed   By: Rolla Flatten M.D.   On: 05/01/2015 14:01    Oren Binet, MD  [facility] Pager:[phone]  If 7PM-7AM, please contact night-coverage [email or web address] Password [email or web address] 05/03/2015, 12:24 PM   LOS: 2 days

## 2015-05-04 DIAGNOSIS — R4182 Altered mental status, unspecified: Secondary | ICD-10-CM

## 2015-05-04 DIAGNOSIS — R410 Disorientation, unspecified: Secondary | ICD-10-CM

## 2015-05-04 DIAGNOSIS — Z515 Encounter for palliative care: Secondary | ICD-10-CM

## 2015-05-04 DIAGNOSIS — Z66 Do not resuscitate: Secondary | ICD-10-CM

## 2015-05-04 DIAGNOSIS — G4733 Obstructive sleep apnea (adult) (pediatric): Secondary | ICD-10-CM

## 2015-05-04 DIAGNOSIS — F05 Delirium due to known physiological condition: Secondary | ICD-10-CM | POA: Insufficient documentation

## 2015-05-04 MED ORDER — METOPROLOL TARTRATE 25 MG PO TABS
25.0000 mg | ORAL_TABLET | Freq: Two times a day (BID) | ORAL | Status: DC
  Administered 2015-05-04 – 2015-05-05 (×3): 25 mg via ORAL
  Filled 2015-05-04 (×4): qty 1

## 2015-05-04 MED ORDER — DEXAMETHASONE 4 MG PO TABS
8.0000 mg | ORAL_TABLET | Freq: Two times a day (BID) | ORAL | Status: DC
  Administered 2015-05-04 – 2015-05-05 (×2): 8 mg via ORAL
  Filled 2015-05-04 (×4): qty 2

## 2015-05-04 NOTE — Progress Notes (Signed)
PATIENT DETAILS Name: [name] Age: 80 y.o. Sex: male Date of Birth: 1935-06-07 Admit Date: 05/01/2015 Admitting Physician Albertine Patricia, MD OAC:ZYSAYT,KZSW A, MD  Brief Narrative: 80 y.o. male  with the prior history of glioblastoma multiforme diagnosed when he presented with a right temporal mass on 08/20/2014. He underwent resection of the right temporal lobe followed by adjuvant chemoradiation with temozolomide started 09/11/2014 complete 10/22/2014. Subsequently he went on adjuvant temozolomide and completed 6 months of therapy. He was brought to the hospital for evaluation of slow deterioration in his memory as well as subtle changes in his behavior with regards to the way he questions that showed or some slurring of speech. MRI brain on admission showed relapse of glioblastoma. Patient was started on Decadron, and admitted for further eval and treatment.  Subjective: Continues to be mildly confused today. 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 Has improved on IV Decadron,somewhat but still confused. No focal neurological deficits. Oncology to discuss with family on 5/31 regarding hospice vs salvage therapy with Avastin. Neurosurgery and palliative care following as well. Await further recommendations.  Active Problems: Obstructive sleep apnea: Continue CPAP.  FUX:NATFTDDU aspirin, beta blocker and statin for secondary prevention.No chest pain.  Essential hypertension: Controlled-continue metoprolol cautiously with holding parameters-as slightly bradycardic, follow.  Dyslipidemia: Continue statin  GERD:Continue PPI   Chronic Bactrim Rx ( chemo  prophylaxis):continue  Disposition: Remain inpatient-Disposition will be much clear after Oncology meets with family 5/31  Antimicrobial agents  See below  Anti-infectives    Start     Dose/Rate Route Frequency Ordered Stop   05/04/15 1000  sulfamethoxazole-trimethoprim (BACTRIM DS,SEPTRA DS) 800-160 MG per tablet 1 tablet     1 tablet Oral Every M-W-F 05/01/15 1654        DVT Prophylaxis:  SCD's  Code Status: Full code   Family Communication None at bedside  Procedures: None  CONSULTS:  hematology/oncology and Neurosurgery, Palliative  Time spent 30 minutes-Greater than 50% of this time was spent in counseling, explanation of diagnosis, planning of further management, and coordination of care.  MEDICATIONS: Scheduled Meds: . aspirin EC  81 mg Oral Daily  . cholecalciferol  1,000 Units Oral Daily  . dexamethasone  8 mg Oral BID  . docusate sodium  100 mg Oral BID  . folic acid  1 mg Oral Daily  . metoprolol tartrate  25 mg Oral BID  . pantoprazole  40 mg Oral Daily  . simvastatin  40 mg Oral q1800  . sodium chloride  3 mL Intravenous Q12H  . sulfamethoxazole-trimethoprim  1 tablet Oral Q M,W,F  . thiamine  100 mg Oral Daily   Continuous Infusions:  PRN Meds:.sodium chloride, acetaminophen **OR** acetaminophen, alum & mag hydroxide-simeth, haloperidol lactate, LORazepam, ondansetron **OR** ondansetron (ZOFRAN) IV, sodium chloride    PHYSICAL EXAM: Vital signs in last 24 hours: Filed Vitals:   05/03/15 1340 05/03/15 1947 05/04/15 0603 05/04/15 1017  BP: 108/64 123/68 119/48 121/76  Pulse: 61 59 50 77  Temp: 97.8 F (36.6 C) 97.7 F (36.5 C)    TempSrc: Oral Axillary Axillary   Resp: 18 16 16    Height:      Weight:      SpO2: 100%  100% 100%     Weight change:  Filed Weights   05/01/15 1112 05/01/15 1731 05/03/15 0150  Weight: 109.856 kg (242 lb 3 oz) 105 kg (231 lb 7.7 oz) 104.5 kg (230 lb 6.1 oz)   Body mass index is 29.57 kg/(m^2).   Gen  Exam: Awake, but mildly confused Neck: Supple, No JVD.   Chest: B/L Clear.  No rales CVS: S1 S2 Regular, no murmurs.  Abdomen: soft, BS +, non tender, non distended.  Extremities: no edema, lower extremities warm to touch. Neurologic: Non Focal.   Skin: No Rash.   Wounds: N/A.    Intake/Output from previous day:  Intake/Output Summary (Last 24 hours) at 05/04/15 1107 Last data filed at 05/04/15 1017  Gross per 24 hour  Intake   1083 ml  Output    950 ml  Net    133 ml     LAB RESULTS: CBC  Recent Labs Lab 05/01/15 1103 05/01/15 1119 05/02/15 0405 05/03/15 0914  WBC 6.6  --  7.7 17.4*  HGB 13.2 13.3 12.3* 14.4  HCT 38.7* 39.0 37.0* 42.1  PLT 193  --  175 201  MCV 88.4  --  88.1 87.2  MCH 30.1  --  29.3 29.8  MCHC 34.1  --  33.2 34.2  RDW 14.1  --  13.8 14.0  LYMPHSABS 0.7  --   --   --   MONOABS 0.6  --   --   --   EOSABS 0.2  --   --   --   BASOSABS 0.0  --   --   --     Chemistries   Recent Labs Lab 05/01/15 1103 05/01/15 1119 05/02/15 0405 05/03/15 0914  NA 139 138 134* 140  K 3.8 3.7 4.0 4.2  CL 107 103 104 108  CO2 23  --  21* 25  GLUCOSE 104* 101* 182* 135*  BUN 16 18 16 18   CREATININE 1.23 1.20 0.92 0.90  CALCIUM 9.0  --  8.8* 9.3    CBG: No results for input(s): GLUCAP in the last 168 hours.  GFR Estimated Creatinine Clearance: 87.2 mL/min (by C-G formula based on Cr of 0.9).  Coagulation profile No results for input(s): INR, PROTIME in the last 168 hours.  Cardiac Enzymes No results for input(s): CKMB, TROPONINI, MYOGLOBIN in the last 168 hours.  Invalid input(s): CK  Invalid input(s): POCBNP No results for input(s): DDIMER in the last 72 hours. No results for input(s): HGBA1C in the last 72 hours. No results for input(s): CHOL, HDL, LDLCALC, TRIG, CHOLHDL, LDLDIRECT in the last 72 hours. No results for input(s): TSH, T4TOTAL, T3FREE, THYROIDAB in the last 72 hours.  Invalid input(s): FREET3 No results for input(s):  VITAMINB12, FOLATE, FERRITIN, TIBC, IRON, RETICCTPCT in the last 72 hours. No results for input(s): LIPASE, AMYLASE in the last 72 hours.  Urine Studies No results for input(s): UHGB, CRYS in the last 72 hours.  Invalid input(s): UACOL, UAPR, USPG, UPH, UTP, UGL, UKET, UBIL, UNIT, UROB, ULEU, UEPI, UWBC, URBC, UBAC, CAST, UCOM, BILUA  MICROBIOLOGY: No results found for this or any previous visit (from the past 240 hour(s)).  RADIOLOGY STUDIES/RESULTS: Mr Kizzie Fantasia Contrast  05/01/2015   CLINICAL DATA:  Slurred speech, confusion, and unsteady gait for 1 week. History of GBM.  EXAM: MRI HEAD WITHOUT AND WITH CONTRAST  TECHNIQUE: Multiplanar, multiecho pulse sequences of the brain and surrounding structures were obtained without and with intravenous contrast.  CONTRAST:  MultiHance 20 mL.  COMPARISON:  01/29/2015 most recent.  Preoperative scan 08/19/2014.  FINDINGS: The patient is status post craniotomy for subtotal glioblastoma multiforme removal.  There is significant tumor recurrence now identified at the surgical site. Previously identified only slightly enhancing tumor bad now displays marked increase in size as well as irregular thick-walled enhancement area of tumor recurrence which is accompanied by necrosis/ encephalomalacia centrally is 45 x 63 x 40 mm. Enhancement extends to the lateral margin of the temporal horn. In addition, there is subependymal spread of tumor to the contralateral LEFT occipital horn. Marked surrounding vasogenic edema extends throughout the entire RIGHT hemisphere, and results and RIGHT to LEFT shift of approximately 5 mm.  Generalized atrophy. Minor white matter disease. Previously identified RIGHT frontal extra-axial fluid collection has shown significant improvement since the previous exam, now measuring 8 mm thick as opposed to 19 mm thick previously. Only minor mass effect on the RIGHT frontal cortex, not felt to be significant given the degree of atrophy.   IMPRESSION: Significant tumor recurrence now identified at the site of previous RIGHT temporal lobectomy. This measures 45 x 63 x 40 mm. Extensive vasogenic edema throughout the RIGHT hemisphere with 5 mm right-to-left shift.  Subependymal spread of tumor to the contralateral LEFT occipital horn.  Improved right-sided subdural hematoma. No significant mass effect at this time.   Electronically Signed   By: Rolla Flatten M.D.   On: 05/01/2015 14:01    Oren Binet, MD  [facility] Pager:[phone]  If 7PM-7AM, please contact night-coverage [email or web address] Password [email or web address] 05/04/2015, 11:07 AM   LOS: 3 days

## 2015-05-04 NOTE — Progress Notes (Addendum)
IP PROGRESS NOTE  Subjective:   He is known to me with a history of a glioblastoma. He was admitted to 05/01/2015 with confusion and ataxia. He was placed on Decadron. Mr. Decola has no specific complaints today.  Objective: Vital signs in last 24 hours: Blood pressure 119/48, pulse 50, temperature 97.7 F (36.5 C), temperature source Axillary, resp. rate 16, height $RemoveBe'6\' 2"'kmhLNDIMh$  (1.88 m), weight 230 lb 6.1 oz (104.5 kg), SpO2 100 %.  Intake/Output from previous day: 05/29 0701 - 05/30 0700 In: 1200 [P.O.:1200] Out: 1150 [Urine:1150]  Physical Exam:  HEENT: No thrush Lungs: Rhonchi at the left posterior base, no respiratory distress Cardiac: Regular rate and rhythm Abdomen: No hepatosplenomegaly Extremities: No leg edema Neurologic: Alert, not oriented, confused. Moves all extremities. Follows commands. 4+/5 strength at the left arm and leg.   Lab Results:  Recent Labs  05/02/15 0405 05/03/15 0914  WBC 7.7 17.4*  HGB 12.3* 14.4  HCT 37.0* 42.1  PLT 175 201    BMET  Recent Labs  05/02/15 0405 05/03/15 0914  NA 134* 140  K 4.0 4.2  CL 104 108  CO2 21* 25  GLUCOSE 182* 135*  BUN 16 18  CREATININE 0.92 0.90  CALCIUM 8.8* 9.3    Studies/Results: No results found.  Medications: I have reviewed the patient's current medications.  Assessment/Plan:  1. Glioblastoma Multiforme, status post resection of a right temporal mass 08/20/2014  MGMT methylation not detected  Negative for IDH1 and IDH2 mutations  Initiation of adjuvant temozolomide and radiation 09/11/2014, radiation completed 10/22/2014  Cycle 6 adjuvant temozolomide began 04/06/2015  MRI 05/01/2015 with significant tumor progression at the right temporal lobe with extensive vasogenic edema and subependymal spread to the left occipital horn  2. History of an altered mental status secondary to #1 3. History of coronary artery disease 4. Lower extremity varicosities 5. Sleep apnea 6. Right subdural  hematoma noted on a brain MRI 01/29/2015, improved on the MRI 05/01/2015  Mr. Saldivar was admitted with symptomatic progression of the glioblastoma. He remains confused. His family is not present this morning. I will discuss salvage treatment options when his wife is available. We can consider a trial of Avastin versus supportive care/hospice. It will be difficult to care for him at home in his current condition.  Recommendations: 1. Continue Decadron, switch to by mouth 2. I will discuss a trial of Avastin versus Hospice when his wife is available. 3. Add DVT prophylaxis with Lovenox   LOS: 3 days   [place]  05/04/2015, 8:07 AM

## 2015-05-04 NOTE — Progress Notes (Signed)
Cpap not attempted at this time due to increased confusion/agitation.  Pt is very restless, currently trying to get out of bed, sitter at bedside.  Pt has refused cpap for last two night and/or unable to keep mask on.  RN notified.

## 2015-05-04 NOTE — Progress Notes (Signed)
Physical Therapy Treatment Patient Details Name: [name] MRN: [id number] DOB: 06/07/1935 Today's Date: 05/04/2015    History of Present Illness 80 yo male admitted with AMS, MRI + for glioblastoma recurrence. Hx of R side craniotomy 08/2014, MI, HTN, obesity, SDH on MRI 01/2015    PT Comments    Pt continues to require intermittent assist while ambulating. Remains confused at times with some difficulty processing, following commands. Palliative NP in meeting with family during session so unable to discuss d/c plans at this time.   Follow Up Recommendations  Supervision/Assistance - 24 hour;Home health PT vs Outpatient PT (depending on family decision)     Equipment Recommendations  None recommended by PT    Recommendations for Other Services       Precautions / Restrictions Precautions Precautions: Fall Restrictions Weight Bearing Restrictions: No    Mobility  Bed Mobility Overal bed mobility: Needs Assistance Bed Mobility: Supine to Sit;Sit to Supine     Supine to sit: Supervision Sit to supine: Supervision   General bed mobility comments: for safety  Transfers Overall transfer level: Needs assistance   Transfers: Sit to/from Stand Sit to Stand: Min guard         General transfer comment: close guard for safety  Ambulation/Gait Ambulation/Gait assistance: Min assist;Min guard Ambulation Distance (Feet): 1000 Feet Assistive device: None Gait Pattern/deviations: Decreased stride length;Step-through pattern;Drifts right/left     General Gait Details: Intermittent use of handrail. Unsteady at times but no overt LOB. Noted pt to bump into objects on L side-cues to slowly scan environment   Stairs            Wheelchair Mobility    Modified Rankin (Stroke Patients Only)       Balance                             High level balance activites: Side stepping;Backward walking;Direction changes;Turns;Sudden stops;Head turns High  Level Balance Comments: Assist to stabilize intermittently     Cognition Arousal/Alertness: Awake/alert Behavior During Therapy: WFL for tasks assessed/performed Overall Cognitive Status: Impaired/Different from baseline Area of Impairment: Problem solving;Attention       Following Commands: Follows one step commands with increased time     Problem Solving: Slow processing;Requires tactile cues;Requires verbal cues      Exercises General Exercises - Lower Extremity Hip Flexion/Marching: AROM;10 reps;Standing Heel Raises: AROM;15 reps;Standing Mini-Sqauts: AROM;20 reps;Standing    General Comments        Pertinent Vitals/Pain Pain Assessment: No/denies pain    Home Living                      Prior Function            PT Goals (current goals can now be found in the care plan section) Progress towards PT goals: Progressing toward goals    Frequency  Min 3X/week    PT Plan Current plan remains appropriate    Co-evaluation             End of Session Equipment Utilized During Treatment: Gait belt Activity Tolerance: Patient tolerated treatment well Patient left: in bed;with call bell/phone within reach;with bed alarm set;with family/visitor present     Time: 9417-4081 PT Time Calculation (min) (ACUTE ONLY): 21 min  Charges:  $Gait Training: 8-22 mins                    G Codes:  Weston Anna, MPT Pager: [phone]

## 2015-05-04 NOTE — Progress Notes (Signed)
Daily Progress Note   Patient Name: [name]       Date: 05/04/2015 DOB: 1935-05-23  Age: 80 y.o. MRN#: [id number] Attending Physician: Jonetta Osgood, MD Primary Care Physician: Jerlyn Ly, MD Admit Date: 05/01/2015  Reason for Consultation/Follow-up: Disposition, Establishing goals of care, Non pain symptom management and Psychosocial/spiritual support  Subjective:  As fu visit:  This NP Wadie Lessen reviewed medical records, received report from team, assessed the patient and then meet at the patient's bedside along with his wife, son Quantay Zaremba # [phone], daughter Ladon Applebaum C# [phone], (husband Mancel Bale # [phone]) to discuss diagnosis, prognosis, GOC, EOL wishes disposition and options.   A detailed discussion was had today regarding advanced directives.  Concepts specific to code status, artifical feeding and hydration, continued IV antibiotics and rehospitalization was had.  The difference between a aggressive medical intervention path  and a palliative comfort care path for this patient at this time was had.  Values and goals of care important to patient and family were attempted to be elicited.  Concept of Hospice and Palliative Care were discussed, details to each service was offered  Natural trajectory and expectations at EOL were discussed.  Questions and concerns addressed.  Hard Choices booklet left for review. Family encouraged to call with questions or concerns.  PMT will continue to support holistically.   Length of Stay: 3 days  Current Medications: Scheduled Meds:  . aspirin EC  81 mg Oral Daily  . cholecalciferol  1,000 Units Oral Daily  . dexamethasone  8 mg Oral BID  . docusate sodium  100 mg Oral BID  . folic acid  1 mg Oral Daily  . metoprolol tartrate  25 mg Oral BID  . pantoprazole  40 mg Oral Daily  . simvastatin  40 mg Oral q1800  . sodium chloride  3 mL Intravenous Q12H  . sulfamethoxazole-trimethoprim  1 tablet Oral Q M,W,F   . thiamine  100 mg Oral Daily    Continuous Infusions:    PRN Meds: sodium chloride, acetaminophen **OR** acetaminophen, alum & mag hydroxide-simeth, haloperidol lactate, LORazepam, ondansetron **OR** ondansetron (ZOFRAN) IV, sodium chloride  Palliative Performance Scale: 40%     Vital Signs: BP 121/76 mmHg  Pulse 77  Temp(Src) 97.7 F (36.5 C) (Axillary)  Resp 16  Ht 6\' 2"  (1.88 m)  Wt 104.5 kg (230 lb 6.1 oz)  BMI 29.57 kg/m2  SpO2 100% SpO2: SpO2: 100 % O2 Device: O2 Device: Not Delivered O2 Flow Rate:    Intake/output summary:  Intake/Output Summary (Last 24 hours) at 05/04/15 1149 Last data filed at 05/04/15 1017  Gross per 24 hour  Intake   1083 ml  Output    950 ml  Net    133 ml   LBM:   Baseline Weight: Weight: 109.856 kg (242 lb 3 oz) Most recent weight: Weight: 104.5 kg (230 lb 6.1 oz)  Physical Exam: General: well nourished/developed, pleasantly confused (ambulating with PT, smiling) HEENT: moist buccal membranes, no exudate CVS:RRR Resp: CTA Abd:  Soft NT +BS           Additional Data Reviewed: Recent Labs     05/02/15  0405  05/03/15  0914  WBC  7.7  17.4*  HGB  12.3*  14.4  PLT  175  201  NA  134*  140  BUN  16  18  CREATININE  0.92  0.90     Problem List:  Patient Active Problem List  Diagnosis Date Noted  . Palliative care encounter   . Glioblastoma 05/01/2015  . Onychomycosis 02/03/2015  . 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 OSA on CPAP 03/25/2014  . Coronary artery disease   . MI, old   . Hypertension   . Hyperlipidemia   . Chronic venous insufficiency      Palliative Care Assessment & Plan    Code Status:  DNR-documented today-as requested in his living will  Goals of Care:  Family await in put from Dr Benay Spice in the morning.  All understand the overall limited prognosis, hope is for comfort and dignity.  All are concerned with anticipatory care needs and financial burden of care.   Desire for  further Chaplancy support:no/ family has strong community church support  3. Symptom Management:  Discussed with family utilization of medications  for anticipated symptoms including, mental status changes, behavioral changes, safety, seizure, pain.  Will need continued support and help navigating this difficult situation.  At this time patient is comfortable  5. Prognosis: < 6 months  5. Discharge Planning: Pending; family is  hopeful for SNF placement under  Medicare.  Wife cannot continue to care for patient at home.   Care plan was discussed with Dr Sloan Leiter and patient's family  Thank you for allowing the Palliative Medicine Team to assist in the care of this patient.   Time In: 1130 Time Out: 1230 Total Time 60 min Prolonged Time Billed  no     Greater than 50%  of this time was spent counseling and coordinating care related to the above assessment and plan.   Knox Royalty, NP  05/04/2015, 11:49 AM  Please contact Palliative Medicine Team phone at [phone] for questions and concerns.    Discussed with Dr Sloan Leiter

## 2015-05-05 ENCOUNTER — Encounter: Payer: Self-pay | Admitting: Oncology

## 2015-05-05 ENCOUNTER — Other Ambulatory Visit: Payer: Self-pay | Admitting: Oncology

## 2015-05-05 ENCOUNTER — Other Ambulatory Visit: Payer: Self-pay | Admitting: *Deleted

## 2015-05-05 DIAGNOSIS — C719 Malignant neoplasm of brain, unspecified: Secondary | ICD-10-CM

## 2015-05-05 DIAGNOSIS — R404 Transient alteration of awareness: Secondary | ICD-10-CM

## 2015-05-05 MED ORDER — PANTOPRAZOLE SODIUM 40 MG PO TBEC: 40.0000 mg | DELAYED_RELEASE_TABLET | Freq: Every day | ORAL | Status: DC

## 2015-05-05 MED ORDER — LORAZEPAM 1 MG PO TABS: 1.0000 mg | ORAL_TABLET | Freq: Four times a day (QID) | ORAL | Status: AC | PRN

## 2015-05-05 MED ORDER — DEXAMETHASONE 4 MG PO TABS: 8.0000 mg | ORAL_TABLET | Freq: Two times a day (BID) | ORAL | Status: AC

## 2015-05-05 MED ORDER — AMLODIPINE BESYLATE 5 MG PO TABS
5.0000 mg | ORAL_TABLET | Freq: Every day | ORAL | Status: DC
Start: 2015-05-05 — End: 2015-06-05

## 2015-05-05 MED ORDER — THIAMINE HCL 100 MG PO TABS: 100.0000 mg | ORAL_TABLET | Freq: Every day | ORAL | Status: DC

## 2015-05-05 NOTE — Progress Notes (Signed)
Pt for discharge to [facility].   CSW facilitated pt discharge need including contacting facility, faxing pt discharge information via [facility], discussing with pt wife at bedside and pt son, Grayland Ormond via telephone, providing RN phone number to call report, providing discharge packet to pt wife to provide to facility, and arranging with pt son for pt son to transport pt via private vehicle to [facility].   Pt and pt family relieved that [name] going to [facility] able to accept pt as pt wife did not feel that she could manage pt care at home. Pt family appreciative of CSW support and assistance.   No further social work needs identified at this time.  CSW signing off.   Alison Murray, MSW, [place] Work [phone]

## 2015-05-05 NOTE — Progress Notes (Signed)
IP PROGRESS NOTE  Subjective:  He appears unchanged. He is eating breakfast with multiple family members in the room.  Objective: Vital signs in last 24 hours: Blood pressure 122/66, pulse 47, temperature 97.7 F (36.5 C), temperature source Oral, resp. rate 18, height $RemoveBe'6\' 2"'qdYduqITc$  (1.88 m), weight 230 lb 6.1 oz (104.5 kg), SpO2 100 %.  Intake/Output from previous day: 05/30 0701 - 05/31 0700 In: 712 [P.O.:840; I.V.:3] Out: 1300 [Urine:1300]  Physical Exam:   Neurologic: Alert, not oriented, confused. Is able to ambulate with assistance.   Lab Results:  Recent Labs  05/03/15 0914  WBC 17.4*  HGB 14.4  HCT 42.1  PLT 201    BMET  Recent Labs  05/03/15 0914  NA 140  K 4.2  CL 108  CO2 25  GLUCOSE 135*  BUN 18  CREATININE 0.90  CALCIUM 9.3    Studies/Results: No results found.  Medications: I have reviewed the patient's current medications.  Assessment/Plan:  1. Glioblastoma Multiforme, status post resection of a right temporal mass 08/20/2014  MGMT methylation not detected  Negative for IDH1 and IDH2 mutations  Initiation of adjuvant temozolomide and radiation 09/11/2014, radiation completed 10/22/2014  Cycle 6 adjuvant temozolomide began 04/06/2015  MRI 05/01/2015 with significant tumor progression at the right temporal lobe with extensive vasogenic edema and subependymal spread to the left occipital horn  2. History of an altered mental status secondary to #1 3. History of coronary artery disease 4. Lower extremity varicosities 5. Sleep apnea 6. Right subdural hematoma noted on a brain MRI 01/29/2015, improved on the MRI 05/01/2015  Mr. Breeding remains confused. He has progression of the glioblastoma following recent completion of temozolomide therapy. I discussed the prognosis and treatment options with his wife and other family members. They understand no therapy will be curative and his survival is estimated to be months. He cannot be cared for in the  home with his current mental status. The family plans to pursue skilled nursing facility placement.  We discussed supportive care/hospice versus a trial of single agent bevacizumab. I reviewed the potential toxicities associated with bevacizumab and the expected benefits. They would like to begin a trial of bevacizumab as an outpatient.  Recommendations: 1. Continue Decadron, we will taper as an outpatient 2. Skilled nursing facility placement 3. Outpatient bevacizumab to be scheduled for 05/11/2015   LOS: 4 days   Dariusz Brase, Dominica Severin  05/05/2015, 8:38 AM

## 2015-05-05 NOTE — Clinical Social Work Placement (Signed)
   CLINICAL SOCIAL WORK PLACEMENT  NOTE  Date:  05/05/2015  Patient Details  Name: [name] MRN: [id number] Date of Birth: [birth year]  Clinical Social Work is seeking post-discharge placement for this patient at the [place] level of care (*CSW will initial, date and re-position this form in  chart as items are completed):  No   Patient/family provided with [place] Work Department's list of facilities offering this level of care within the geographic area requested by the patient (or if unable, by the patient's family).  Yes   Patient/family informed of their freedom to choose among providers that offer the needed level of care, that participate in Medicare, Medicaid or managed care program needed by the patient, have an available bed and are willing to accept the patient.  No (pt family requesting [name] specifically)   Patient/family informed of [place]'s ownership interest in [facility] and [facility], as well as of the fact that they are under no obligation to receive care at these facilities.  PASRR submitted to EDS on       PASRR number received on       Existing PASRR number confirmed on 05/05/15     FL2 transmitted to all facilities in geographic area requested by pt/family on 05/05/15     FL2 transmitted to all facilities within larger geographic area on       Patient informed that his/her managed care company has contracts with or will negotiate with certain facilities, including the following:        Yes   Patient/family informed of bed offers received.  Patient [facility] bed at [facility]     Physician recommends and patient chooses bed at      [facility] to be transferred to [name] on 05[facility]15.  Patient to be transferred to facility by [name])     Patient family notified on 05/05/15 of transfer.  Name of family member notified:  pt wife notified at bedside and pt son notified via  telephone     PHYSICIAN Please sign FL2, Please sign DNR     Additional Comment:    _______________________________________________ Ladell Pier, LCSW 05/05/2015, 2:55 PM

## 2015-05-05 NOTE — Clinical Social Work Placement (Signed)
   CLINICAL SOCIAL WORK PLACEMENT  NOTE  Date:  05/05/2015  Patient Details  Name: [name] MRN: [id number] Date of Birth: [birth year]  Clinical Social Work is seeking post-discharge placement for this patient at the [place] level of care (*CSW will initial, date and re-position this form in  chart as items are completed):  No   Patient/family provided with [place] Work Department's list of facilities offering this level of care within the geographic area requested by the patient (or if unable, by the patient's family).  Yes   Patient/family informed of their freedom to choose among providers that offer the needed level of care, that participate in Medicare, Medicaid or managed care program needed by the patient, have an available bed and are willing to accept the patient.  No (pt family requesting [name])   Patient/family informed of [place]'s ownership interest in [facility] and [facility], as well as of the fact that they are under no obligation to receive care at these facilities.  PASRR submitted to EDS on       PASRR number received on       Existing PASRR number confirmed on 05/05/15     FL2 transmitted to all facilities in geographic area requested by pt/family on 05/05/15     FL2 transmitted to all facilities within larger geographic area on       Patient informed that his/her managed care company has contracts with or will negotiate with certain facilities, including the following:        Yes   Patient/family informed of bed offers received.  Patient [facility] bed at [facility]     Physician recommends and patient chooses bed at      [facility] to be [name] on [facility].  Patient to be transferred to facility by ambulance [name])     Patient family notified on 05/05/15 of transfer.  Name of family member notified:  pt wife notified at bedside and pt son notified via  telephone     PHYSICIAN Please sign FL2, Please sign DNR     Additional Comment:    _______________________________________________ Ladell Pier, LCSW 05/05/2015, 2:38 PM

## 2015-05-05 NOTE — Clinical Social Work Note (Signed)
Clinical Social Work Assessment  Patient Details  Name: [name] MRN: [id number] Date of Birth: Feb 10, 1935  Date of referral:  05/05/15               Reason for consult:  Discharge Planning                Permission sought to share information with:  Family Supports Permission granted to share information::  Yes, Verbal Permission Granted  Name::     Izora Gala Remache/wife and Grayland Ormond [place]/son  Agency::  [name] [place]  Relationship::  wife and son  Contact Information:  [phone]) and [phone] (son)  Housing/Transportation Living arrangements for the past 2 months:  [place] of Information:  Spouse, Adult Children Patient Interpreter Needed:  None Criminal Activity/Legal Involvement Pertinent to Current Situation/Hospitalization:  No - Comment as needed Significant Relationships:  Adult Children, Spouse Lives with:  Spouse Do you feel safe going back to the place where you live?  No Need for family participation in patient care:  Yes (Comment) (pt oriented to person only)  Care giving concerns:  Pt from home with pt wife; pt wife concerned about ability to care for pt at home.    Social Worker assessment / plan:  CSW received referral that pt family interested in [facility], but PT recommending [facility] intially recommended home health, but now recommendation for SNF from this morning OT treatment.  CSW met with pt and pt wife at bedside. Pt alert and oriented to person only. CSW introduced self and explained role. CSW discussed with pt wife CSW understanding that pt wife is interested in [facility] for rehab. Pt wife confirmed these wishes. CSW explained that CSW can send referral to [name] in order for [name] to make determination about if pt Medicare will cover SNF placement. Pt wife agreeable and asked CSW to contact pt son, Grayland Ormond to discuss as  he handles most matters.   CSW contacted pt son, Grayland Ormond via telephone. CSW introduced self and explained role. Pt son discussed that he is hopeful that [name] will be an option as pt was there in the past. CSW explained that CSW will send information to [name] in order for facility to review and determine if pt has a skilled need under Medicare coverage. Pt son expressed that plan will be for pt to return home with home health if [name] is not an option. CSW discussed with pt son that per MD, pt medically ready for discharge today.   CSW completed FL2 and initiated SNF search to [facility]. CSW contacted [name] and explained situation and pt family hopefulness that pt would qualify for SNF. CSW received notification from [name] that facility reviewed information and facility feels that they can accept pt and bill pt Medicare for coverage for SNF.   CSW notified pt son via telephone and pt son grateful that [name] willing to accept pt.   CSW notified MD.  CSW to facilitate pt discharge needs this afternoon.   Employment status:  Retired Forensic scientist:  [employer] PT Recommendations:  [place], Home with [facility] / Referral to community resources:  [place]  Patient/Family's Response to care:  Pt alert and oriented to person only. Pt wife supportive at bedside, but pt son assist with decision making for pt. Pt son grateful that [name] is  an option, but expressed being overwhelmed with pt discharge today  Patient/Family's Understanding of and Emotional Response to Diagnosis, Current Treatment, and Prognosis:  Pt family knowledgeable about pt diagnosis and treatment plan. Pt son expressed that hospice may be something pt family will consider down the road and CSW encouraged pt son to discuss hospice with pt oncologist as pt is scheduled to start a treatment with oncologist on June 6th. Pt son agreeable to plan  for [name] today although pt son expressed being overwhelmed with discharge today.   Emotional Assessment Appearance:  Appears stated age Attitude/Demeanor/Rapport:  Unable to Assess (pt oriented to person only) Affect (typically observed):  Unable to Assess (pt oriented to person only) Orientation:  Oriented to Self Alcohol / Substance use:    Psych involvement (Current and /or in the community):  No (Comment)  Discharge Needs  Concerns to be addressed:  Discharge Planning Concerns Readmission within the last 30 days:  No Current discharge risk:  None Barriers to Discharge:  No Barriers Identified   KIDD, [place], LCSW 05/05/2015, 2:20 PM

## 2015-05-05 NOTE — Progress Notes (Signed)
PATIENT DETAILS Name: [name] Age: 80 y.o. Sex: male Date of Birth: [birth year] Admit Date: 05/01/2015 Admitting Physician Albertine Patricia, MD NOM:VEHMCN,OBSJ A, MD  Brief Narrative: 80 y.o. male  with the prior history of glioblastoma multiforme diagnosed when he presented with a right temporal mass on 08/20/2014. He underwent resection of the right temporal lobe followed by adjuvant chemoradiation with temozolomide started 09/11/2014 complete 10/22/2014. Subsequently he went on adjuvant temozolomide and completed 6 months of therapy. He was brought to the hospital for evaluation of slow deterioration in his memory as well as subtle changes in his behavior with regards to the way he questions that showed or some slurring of speech. MRI brain on admission showed relapse of glioblastoma. Patient was started on Decadron, and admitted for further eval and treatment.  Subjective: Continues to be mildly confused today.   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 Has improved with Decadron,somewhat but still confused. No focal neurological deficits. Oncology-Dr Benay Spice discussed options with family, they would like to begin a trial of bevacizumab. Oncology will arrange for this to be done in the outpatient setting. Family also with that ultimately hospice/palliative care may need to be involved.   Active Problems: Obstructive sleep apnea: Continue CPAP.  GGE:ZMOQHUTM aspirin, beta blocker and statin for secondary prevention.No chest pain.  Essential hypertension: Controlled-continue metoprolol cautiously with holding parameters-as slightly bradycardic, follow.  Dyslipidemia: Continue statin  GERD:Continue PPI   Chronic Bactrim Rx ( chemo  prophylaxis):continue  Disposition: Remain SNF when bed available. Social worker aware  Antimicrobial agents  See below  Anti-infectives    Start     Dose/Rate Route Frequency Ordered Stop   05/04/15 1000  sulfamethoxazole-trimethoprim (BACTRIM DS,SEPTRA DS) 800-160 MG per tablet 1 tablet     1 tablet Oral Every M-W-F 05/01/15 1654        DVT Prophylaxis:  SCD's  Code Status: DO NOT RESUSCITATE  Family Communication None at bedside  Procedures: None  CONSULTS:  hematology/oncology and Neurosurgery, Palliative  Time spent 15 minutes-Greater than 50% of this time was spent in counseling, explanation of diagnosis, planning of further management, and coordination of care.  MEDICATIONS: Scheduled Meds: . aspirin EC  81 mg Oral Daily  . cholecalciferol  1,000 Units Oral Daily  . dexamethasone  8 mg Oral BID  . docusate sodium  100 mg Oral BID  . folic acid  1 mg Oral Daily  . metoprolol tartrate  25 mg Oral BID  . pantoprazole  40 mg Oral Daily  . simvastatin  40 mg Oral q1800  . sodium chloride  3 mL Intravenous Q12H  . sulfamethoxazole-trimethoprim  1 tablet Oral Q M,W,F  . thiamine  100 mg Oral Daily   Continuous Infusions:  PRN Meds:.sodium chloride, acetaminophen **OR** acetaminophen, alum & mag hydroxide-simeth, haloperidol lactate, LORazepam, ondansetron **OR** ondansetron (ZOFRAN) IV, sodium chloride    PHYSICAL EXAM: Vital signs in last 24 hours: Filed Vitals:   05/04/15 1017 05/04/15 1412 05/04/15 2056 05/05/15 0619  BP: 121/76 119/76 137/73 122/66  Pulse: 77 60 67 47  Temp:  98.4 F (36.9 C) 98.1 F (36.7 C) 97.7 F (36.5 C)  TempSrc:  Oral Oral Oral  Resp:  18 18 18   Height:      Weight:  SpO2:  96% 96% 100%    Weight change:  Filed Weights   05/01/15 1112 05/01/15 1731 05/03/15 0150  Weight: 109.856 kg (242 lb 3 oz) 105 kg (231 lb 7.7 oz) 104.5 kg (230 lb 6.1 oz)   Body mass index is 29.57 kg/(m^2).   Gen Exam: Awake, but mildly  confused Neck: Supple, No JVD.   Chest: B/L Clear.  No rales CVS: S1 S2 Regular, no murmurs.  Abdomen: soft, BS +, non tender, non distended.  Extremities: no edema, lower extremities warm to touch. Neurologic: Non Focal.   Skin: No Rash.   Wounds: N/A.    Intake/Output from previous day:  Intake/Output Summary (Last 24 hours) at 05/05/15 1045 Last data filed at 05/05/15 0900  Gross per 24 hour  Intake    840 ml  Output   1650 ml  Net   -810 ml     LAB RESULTS: CBC  Recent Labs Lab 05/01/15 1103 05/01/15 1119 05/02/15 0405 05/03/15 0914  WBC 6.6  --  7.7 17.4*  HGB 13.2 13.3 12.3* 14.4  HCT 38.7* 39.0 37.0* 42.1  PLT 193  --  175 201  MCV 88.4  --  88.1 87.2  MCH 30.1  --  29.3 29.8  MCHC 34.1  --  33.2 34.2  RDW 14.1  --  13.8 14.0  LYMPHSABS 0.7  --   --   --   MONOABS 0.6  --   --   --   EOSABS 0.2  --   --   --   BASOSABS 0.0  --   --   --     Chemistries   Recent Labs Lab 05/01/15 1103 05/01/15 1119 05/02/15 0405 05/03/15 0914  NA 139 138 134* 140  K 3.8 3.7 4.0 4.2  CL 107 103 104 108  CO2 23  --  21* 25  GLUCOSE 104* 101* 182* 135*  BUN 16 18 16 18   CREATININE 1.23 1.20 0.92 0.90  CALCIUM 9.0  --  8.8* 9.3    CBG: No results for input(s): GLUCAP in the last 168 hours.  GFR Estimated Creatinine Clearance: 87.2 mL/min (by C-G formula based on Cr of 0.9).  Coagulation profile No results for input(s): INR, PROTIME in the last 168 hours.  Cardiac Enzymes No results for input(s): CKMB, TROPONINI, MYOGLOBIN in the last 168 hours.  Invalid input(s): CK  Invalid input(s): POCBNP No results for input(s): DDIMER in the last 72 hours. No results for input(s): HGBA1C in the last 72 hours. No results for input(s): CHOL, HDL, LDLCALC, TRIG, CHOLHDL, LDLDIRECT in the last 72 hours. No results for input(s): TSH, T4TOTAL, T3FREE, THYROIDAB in the last 72 hours.  Invalid input(s): FREET3 No results for input(s): VITAMINB12, FOLATE, FERRITIN,  TIBC, IRON, RETICCTPCT in the last 72 hours. No results for input(s): LIPASE, AMYLASE in the last 72 hours.  Urine Studies No results for input(s): UHGB, CRYS in the last 72 hours.  Invalid input(s): UACOL, UAPR, USPG, UPH, UTP, UGL, UKET, UBIL, UNIT, UROB, ULEU, UEPI, UWBC, URBC, UBAC, CAST, UCOM, BILUA  MICROBIOLOGY: No results found for this or any previous visit (from the past 240 hour(s)).  RADIOLOGY STUDIES/RESULTS: Mr Kizzie Fantasia Contrast  05/01/2015   CLINICAL DATA:  Slurred speech, confusion, and unsteady gait for 1 week. History of GBM.  EXAM: MRI HEAD WITHOUT AND WITH CONTRAST  TECHNIQUE: Multiplanar, multiecho pulse sequences of the brain and surrounding structures were obtained without and with intravenous contrast.  CONTRAST:  MultiHance 20 mL.  COMPARISON:  01/29/2015 most recent.  Preoperative scan 08/19/2014.  FINDINGS: The patient is status post craniotomy for subtotal glioblastoma multiforme removal.  There is significant tumor recurrence now identified at the surgical site. Previously identified only slightly enhancing tumor bad now displays marked increase in size as well as irregular thick-walled enhancement area of tumor recurrence which is accompanied by necrosis/ encephalomalacia centrally is 45 x 63 x 40 mm. Enhancement extends to the lateral margin of the temporal horn. In addition, there is subependymal spread of tumor to the contralateral LEFT occipital horn. Marked surrounding vasogenic edema extends throughout the entire RIGHT hemisphere, and results and RIGHT to LEFT shift of approximately 5 mm.  Generalized atrophy. Minor white matter disease. Previously identified RIGHT frontal extra-axial fluid collection has shown significant improvement since the previous exam, now measuring 8 mm thick as opposed to 19 mm thick previously. Only minor mass effect on the RIGHT frontal cortex, not felt to be significant given the degree of atrophy.  IMPRESSION: Significant tumor  recurrence now identified at the site of previous RIGHT temporal lobectomy. This measures 45 x 63 x 40 mm. Extensive vasogenic edema throughout the RIGHT hemisphere with 5 mm right-to-left shift.  Subependymal spread of tumor to the contralateral LEFT occipital horn.  Improved right-sided subdural hematoma. No significant mass effect at this time.   Electronically Signed   By: Rolla Flatten M.D.   On: 05/01/2015 14:01    Oren Binet, MD  [facility] Pager:[phone]  If 7PM-7AM, please contact night-coverage [email or web address] Password [email or web address] 05/05/2015, 10:45 AM   LOS: 4 days

## 2015-05-05 NOTE — Progress Notes (Signed)
Son in Sports coach mr. Redmond Baseman left a message about avastin and if we needed anything. I called him back to advise could not release to him without ok from patient, but did advise if dr. Benay Spice wants to start(see notes), then prior auth will be done. The son in law works for [employer] so knows about the foundations.

## 2015-05-05 NOTE — Discharge Summary (Signed)
PATIENT DETAILS Name: [name] Age: 80 y.o. Sex: male Date of Birth: [birth year] MRN: [id number]. Admitting Physician: Albertine Patricia, MD ZCH:YIFOYD,XAJO A, MD  Admit Date: 05/01/2015 Discharge date: 05/05/2015  Recommendations for Outpatient Follow-up:  1. Ensure follow up with oncology-appointment on 6/6 to begin salvage chemotherapy 2. Metoprolol changed to Amlodipine due to Bradycardia 3. Continue Decadron, Oncology will slowly taper.  PRIMARY DISCHARGE DIAGNOSIS:  Principal Problem:   Glioblastoma multiforme of brain Active Problems:   Coronary artery disease   Hypertension   Hyperlipidemia   OSA on CPAP   Glioblastoma   Palliative care encounter   Altered mental status   Acute confusional state   DNR (do not resuscitate)      PAST MEDICAL HISTORY: Past Medical History  Diagnosis Date  . Coronary artery disease   . MI, old     INFERIOR  . Hypertension   . Hyperlipidemia   . Chronic venous insufficiency     and varicose veins  . Anterior myocardial infarction 1990    with subsequent PTCA  . Hypercholesterolemia   . Ulcer of right leg   . Obstructive sleep apnea   . Anemia   . Obesity   . Radiation 09/11/14-10/22/14    right temperol lobe 60 gray  . Brain tumor 08/2014    DISCHARGE MEDICATIONS: Current Discharge Medication List    START taking these medications   Details  amLODipine (NORVASC) 5 MG tablet Take 1 tablet (5 mg total) by mouth daily.    dexamethasone (DECADRON) 4 MG tablet Take 2 tablets (8 mg total) by mouth 2 (two) times daily.   Associated Diagnoses: Glioblastoma    LORazepam (ATIVAN) 1 MG tablet Take 1 tablet (1 mg total) by mouth every 6 (six) hours as needed for anxiety or sedation. Qty: 30 tablet, Refills: 0    pantoprazole (PROTONIX) 40 MG tablet Take 1 tablet (40 mg total) by mouth daily.    thiamine 100 MG tablet Take 1 tablet (100 mg total) by mouth daily.      CONTINUE these medications which have NOT CHANGED     Details  aspirin 81 MG tablet Take 81 mg by mouth daily.   Associated Diagnoses: Glioblastoma multiforme of brain    Cholecalciferol (VITAMIN D-3 PO) Take 1,000 Units by mouth daily.     mupirocin ointment (BACTROBAN) 2 % Apply 1 application topically daily as needed. For cuts/sores   Associated Diagnoses: Glioblastoma multiforme of brain    nitroGLYCERIN (NITROSTAT) 0.4 MG SL tablet Place 1 tablet (0.4 mg total) under the tongue every 5 (five) minutes as needed. Qty: 25 tablet, Refills: 6    ondansetron (ZOFRAN) 8 MG tablet Take one tablet 1 hour prior to Temodar dose and every 12 hours if needed for nausea Qty: 60 tablet, Refills: 1   Associated Diagnoses: Glioblastoma multiforme of brain; Brain cancer    polyethylene glycol (MIRALAX / GLYCOLAX) packet Take 17 g by mouth daily as needed for mild constipation.     simvastatin (ZOCOR) 40 MG tablet Take 40 mg by mouth daily at 6 PM.  Refills: 0    sulfamethoxazole-trimethoprim (BACTRIM DS,SEPTRA DS) 800-160 MG per tablet Take 1 tablet by mouth every Monday, Wednesday, and Friday. For 3 months Qty: 36 tablet, Refills: 1   Associated Diagnoses: Glioblastoma multiforme of brain      STOP taking these medications     metoprolol tartrate (LOPRESSOR) 25 MG tablet      temozolomide (TEMODAR) 180 MG capsule  ALLERGIES:  No Known Allergies  BRIEF HPI:  See H&P, Labs, Consult and Test reports for all details in brief,80 y.o. male with the prior history of glioblastoma multiforme diagnosed when he presented with a right temporal mass on 08/20/2014. He underwent resection of the right temporal lobe followed by adjuvant chemoradiation with temozolomide started 09/11/2014 complete 10/22/2014. Subsequently he went on adjuvant temozolomide and completed 6 months of therapy. He was brought to the hospital for evaluation of slow deterioration in his memory as well as subtle changes in his behavior with regards to the way he questions  that showed or some slurring of speech. MRI brain on admission showed relapse of glioblastoma. Patient was started on Decadron, and admitted for further eval and treatment.  CONSULTATIONS:   hematology/oncology and Neurosurgery and Palliative Care  PERTINENT RADIOLOGIC STUDIES: Mr Kizzie Fantasia Contrast  05/15/2015   CLINICAL DATA:  Slurred speech, confusion, and unsteady gait for 1 week. History of GBM.  EXAM: MRI HEAD WITHOUT AND WITH CONTRAST  TECHNIQUE: Multiplanar, multiecho pulse sequences of the brain and surrounding structures were obtained without and with intravenous contrast.  CONTRAST:  MultiHance 20 mL.  COMPARISON:  01/29/2015 most recent.  Preoperative scan 08/19/2014.  FINDINGS: The patient is status post craniotomy for subtotal glioblastoma multiforme removal.  There is significant tumor recurrence now identified at the surgical site. Previously identified only slightly enhancing tumor bad now displays marked increase in size as well as irregular thick-walled enhancement area of tumor recurrence which is accompanied by necrosis/ encephalomalacia centrally is 45 x 63 x 40 mm. Enhancement extends to the lateral margin of the temporal horn. In addition, there is subependymal spread of tumor to the contralateral LEFT occipital horn. Marked surrounding vasogenic edema extends throughout the entire RIGHT hemisphere, and results and RIGHT to LEFT shift of approximately 5 mm.  Generalized atrophy. Minor white matter disease. Previously identified RIGHT frontal extra-axial fluid collection has shown significant improvement since the previous exam, now measuring 8 mm thick as opposed to 19 mm thick previously. Only minor mass effect on the RIGHT frontal cortex, not felt to be significant given the degree of atrophy.  IMPRESSION: Significant tumor recurrence now identified at the site of previous RIGHT temporal lobectomy. This measures 45 x 63 x 40 mm. Extensive vasogenic edema throughout the RIGHT  hemisphere with 5 mm right-to-left shift.  Subependymal spread of tumor to the contralateral LEFT occipital horn.  Improved right-sided subdural hematoma. No significant mass effect at this time.   Electronically Signed   By: Rolla Flatten M.D.   On: 15-May-2015 14:01     PERTINENT LAB RESULTS: CBC:  Recent Labs  05/03/15 0914  WBC 17.4*  HGB 14.4  HCT 42.1  PLT 201   CMET CMP     Component Value Date/Time   NA 140 05/03/2015 0914   NA 140 04/03/2015 0939   K 4.2 05/03/2015 0914   K 4.1 04/03/2015 0939   CL 108 05/03/2015 0914   CO2 25 05/03/2015 0914   CO2 25 04/03/2015 0939   GLUCOSE 135* 05/03/2015 0914   GLUCOSE 99 04/03/2015 0939   BUN 18 05/03/2015 0914   BUN 18.1 04/03/2015 0939   CREATININE 0.90 05/03/2015 0914   CREATININE 1.1 04/03/2015 0939   CALCIUM 9.3 05/03/2015 0914   CALCIUM 8.9 04/03/2015 0939   PROT 5.6* 05/02/2015 0405   PROT 5.5* 04/03/2015 0939   ALBUMIN 3.4* 05/02/2015 0405   ALBUMIN 3.6 04/03/2015 0939   AST 16 05/02/2015  0405   AST 12 04/03/2015 0939   ALT 13* 05/02/2015 0405   ALT 11 04/03/2015 0939   ALKPHOS 44 05/02/2015 0405   ALKPHOS 51 04/03/2015 0939   BILITOT 0.6 05/02/2015 0405   BILITOT 0.74 04/03/2015 0939   GFRNONAA >60 05/03/2015 0914   GFRAA >60 05/03/2015 0914    GFR Estimated Creatinine Clearance: 87.2 mL/min (by C-G formula based on Cr of 0.9). No results for input(s): LIPASE, AMYLASE in the last 72 hours. No results for input(s): CKTOTAL, CKMB, CKMBINDEX, TROPONINI in the last 72 hours. Invalid input(s): POCBNP No results for input(s): DDIMER in the last 72 hours. No results for input(s): HGBA1C in the last 72 hours. No results for input(s): CHOL, HDL, LDLCALC, TRIG, CHOLHDL, LDLDIRECT in the last 72 hours. No results for input(s): TSH, T4TOTAL, T3FREE, THYROIDAB in the last 72 hours.  Invalid input(s): FREET3 No results for input(s): VITAMINB12, FOLATE, FERRITIN, TIBC, IRON, RETICCTPCT in the last 72  hours. Coags: No results for input(s): INR in the last 72 hours.  Invalid input(s): PT Microbiology: No results found for this or any previous visit (from the past 240 hour(s)).   BRIEF HOSPITAL COURSE:  Brief Narrative: 80 y.o. male with the prior history of glioblastoma multiforme diagnosed when he presented with a right temporal mass on 08/20/2014. He underwent resection of the right temporal lobe followed by adjuvant chemoradiation with temozolomide started 09/11/2014 complete 10/22/2014. Subsequently he went on adjuvant temozolomide and completed 6 months of therapy. He was brought to the hospital for evaluation of slow deterioration in his memory as well as subtle changes in his behavior with regards to the way he questions that showed or some slurring of speech. MRI brain on admission showed relapse of 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 Has improved with Decadron,somewhat but still confused. No focal neurological deficits. Oncology-Dr Benay Spice discussed options with family, they would like to begin a trial of bevacizumab. Oncology will arrange for this to be done in the outpatient setting. Family also aware that ultimately hospice/palliative care may need to be involved.   Active Problems: Obstructive sleep apnea: Continue CPAP.   FTD:DUKGURKY aspirin,  and statin for secondary prevention.Metoprolol discontinued due to bradycardia.No chest pain.  Essential hypertension: Controlled-will change from metoprolol to amlodipine given intermitted bradycardia. Please follow and optimize in the outpatient setting  Dyslipidemia: Continue statin  GERD:Continue PPI   Chronic Bactrim Rx ( chemo prophylaxis):continue   TODAY-DAY OF DISCHARGE:  Subjective:   Raymar Joiner today  has no headache,no chest abdominal pain,no new weakness tingling or numbness. Continue to be intermittently confused.  Objective:   Blood pressure 122/66, pulse 47, temperature 97.7 F (36.5 C), temperature source Oral, resp. rate 18, height 6\' 2"  (1.88 m), weight 104.5 kg (230 lb 6.1 oz), SpO2 100 %.  Intake/Output Summary (Last 24 hours) at 05/05/15 1121 Last data filed at 05/05/15 0900  Gross per 24 hour  Intake    840 ml  Output   1650 ml  Net   -810 ml   Filed Weights   05/01/15 1112 05/01/15 1731 05/03/15 0150  Weight: 109.856 kg (242 lb 3 oz) 105 kg (231 lb 7.7 oz) 104.5 kg (230 lb 6.1 oz)    Exam Awake Alert, Oriented *3, No new F.N deficits, Normal affect [place].AT,PERRAL Supple Neck,No JVD, No cervical  lymphadenopathy appriciated.  Symmetrical Chest wall movement, Good air movement bilaterally, CTAB RRR,No Gallops,Rubs or new Murmurs, No Parasternal Heave +ve B.Sounds, Abd Soft, Non tender, No organomegaly appriciated, No rebound -guarding or rigidity. No Cyanosis, Clubbing or edema, No new Rash or bruise  DISCHARGE CONDITION: Stable  DISPOSITION: SNF  DISCHARGE INSTRUCTIONS:    Activity:  As tolerated with Full fall precautions use walker/cane & assistance as needed  Diet recommendation: Heart Healthy diet  Discharge Instructions    Call MD for:  persistant nausea and vomiting    Complete by:  As directed      Call MD for:  severe uncontrolled pain    Complete by:  As directed      Diet - low sodium heart healthy    Complete by:  As directed      Increase activity slowly    Complete by:  As directed           Follow-up Information    Follow up with PERINI,MARK A, MD. Schedule an appointment as soon as possible for a visit in 2 weeks.   Specialty:  Internal Medicine   Contact information:   [place] [address] [phone]       Follow up with Betsy Coder, MD On 05/11/2015.   Specialty:  Oncology   Why:  appointment at 9:30 am    Contact information:   [place] [address] [phone]      Total Time spent on discharge equals 45 minutes.  SignedOren Binet 05/05/2015 11:21 AM

## 2015-05-05 NOTE — Progress Notes (Signed)
Occupational Therapy Treatment Patient Details Name: [name] MRN: [id number] DOB: 1935-01-03 Today's Date: 05/05/2015    History of present illness 80 yo male admitted with AMS, MRI + for glioblastoma recurrence. Hx of R side craniotomy 08/2014, MI, HTN, obesity, SDH on MRI 01/2015   OT comments  Pt was sleepy this visit and required increased time to arouse enough to participate. Pt's wife present and supportive. Will follow on acute and wife agreeable for OT to continue with functional tasks. Per chart, family pursuing SNF.    Follow Up Recommendations  SNF;Supervision/Assistance - 24 hour    Equipment Recommendations  None recommended by OT    Recommendations for Other Services      Precautions / Restrictions Precautions Precautions: Fall       Mobility Bed Mobility Overal bed mobility: Needs Assistance Bed Mobility: Supine to Sit;Sit to Supine     Supine to sit: Min assist Sit to supine: Min guard      Transfers Overall transfer level: Needs assistance Equipment used: Rolling walker (2 wheeled) Transfers: Sit to/from Stand Sit to Stand: Min assist         General transfer comment: min assist for safety and with sitting down as pt sleepy and needed cues also for reaching back for bed.    Balance                                   ADL                           Toilet Transfer: Minimal assistance;Ambulation;RW             General ADL Comments: Pt is not oriented to current location and wife reminded pt where he currently is. Requested that pt help with donning  and doffing gown but he had much difficulty with processing how to complete these tasks. He required max verbal cues and assist. pt needs cue to safely manage walker as he tends to push it too far in front of him and displayed decreased initation to make a turn on his own. Pt had already completed toileting and grooming tasks this am per nursing tech and wife.       Vision                     Perception     Praxis      Cognition   Behavior During Therapy: [facility] for tasks assessed/performed Overall Cognitive Status: Impaired/Different from baseline Area of Impairment: Safety/judgement;Problem solving;Following commands Orientation Level: Place      Following Commands: Follows one step commands inconsistently     Problem Solving: Slow processing;Requires verbal cues;Requires tactile cues;Decreased initiation;Difficulty sequencing      Extremity/Trunk Assessment               Exercises     Shoulder Instructions       General Comments      Pertinent Vitals/ Pain       Pain Assessment: No/denies pain  Home Living                                          Prior Functioning/Environment              Frequency Min 2X/week  Progress Toward Goals  OT Goals(current goals can now be found in the care plan section)  Progress towards OT goals: Not progressing toward goals - comment (pt sleepy today)     Plan Discharge plan remains appropriate    Co-evaluation                 End of Session Equipment Utilized During Treatment: Gait belt   Activity Tolerance Patient tolerated treatment well   Patient Left in bed;with call bell/phone within reach   Nurse Communication          Time: 6579-0383 OT Time Calculation (min): 16 min  Charges: OT General Charges $OT Visit: 1 Procedure OT Treatments $Therapeutic Activity: 8-22 mins  Jules Schick  [phone] 05/05/2015, 10:42 AM

## 2015-05-05 NOTE — Progress Notes (Signed)
Daily Progress Note   Patient Name: [name]       Date: 05/05/2015 DOB: 11-Jan-1935  Age: 80 y.o. MRN#: [id number] Attending Physician: Jonetta Osgood, MD Primary Care Physician: Jerlyn Ly, MD Admit Date: 05/01/2015  Reason for Consultation/Follow-up: Disposition, Establishing goals of care, Non pain symptom management and Psychosocial/spiritual support  Subjective:  Continued conversation regarding treatment plan and disposition options.  SW working on disposition, family is hopeful for SNF.  Difficult situation, wife cannot handle his care at home, family cannot afford out of pocket AL or 24/7 in home caregivers.  I worry about this patient with a GBM and anticipated rapid physical, functional and cognitive changes.  Family will need continued holistic support.  Length of Stay: 4 days  Current Medications: Scheduled Meds:  . aspirin EC  81 mg Oral Daily  . cholecalciferol  1,000 Units Oral Daily  . dexamethasone  8 mg Oral BID  . docusate sodium  100 mg Oral BID  . folic acid  1 mg Oral Daily  . metoprolol tartrate  25 mg Oral BID  . pantoprazole  40 mg Oral Daily  . simvastatin  40 mg Oral q1800  . sodium chloride  3 mL Intravenous Q12H  . sulfamethoxazole-trimethoprim  1 tablet Oral Q M,W,F  . thiamine  100 mg Oral Daily    Continuous Infusions:    PRN Meds: sodium chloride, acetaminophen **OR** acetaminophen, alum & mag hydroxide-simeth, haloperidol lactate, LORazepam, ondansetron **OR** ondansetron (ZOFRAN) IV, sodium chloride  Palliative Performance Scale: 40%     Vital Signs: BP 122/66 mmHg  Pulse 47  Temp(Src) 97.7 F (36.5 C) (Oral)  Resp 18  Ht 6\' 2"  (1.88 m)  Wt 104.5 kg (230 lb 6.1 oz)  BMI 29.57 kg/m2  SpO2 100% SpO2: SpO2: 100 % O2 Device: O2 Device: Not Delivered O2 Flow Rate:    Intake/output summary:   Intake/Output Summary (Last 24 hours) at 05/05/15 1329 Last data filed at 05/05/15 0900  Gross per 24 hour  Intake    840  ml  Output   1650 ml  Net   -810 ml   LBM:   Baseline Weight: Weight: 109.856 kg (242 lb 3 oz) Most recent weight: Weight:  (i was unable to get pt weight.)  Physical Exam: General: well nourished/developed, pleasantly confused (ambulating with PT, smiling) HEENT: moist buccal membranes, no exudate CVS:RRR Resp: CTA Abd:  Soft NT +BS           Additional Data Reviewed: Recent Labs     05/03/15  0914  WBC  17.4*  HGB  14.4  PLT  201  NA  140  BUN  18  CREATININE  0.90     Problem List:  Patient Active Problem List   Diagnosis Date Noted  . Altered mental status 05/04/2015  . Acute confusional state   . DNR (do not resuscitate)   . Palliative care encounter   . Glioblastoma 05/01/2015  . Onychomycosis 02/03/2015  . 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 OSA on CPAP 03/25/2014  . Coronary artery disease   . MI, old   . Hypertension   . Hyperlipidemia   . Chronic venous insufficiency      Palliative Care Assessment & Plan    Code Status:  DNR-  Goals of Care:  Decision is to trial Bevacizumab as OP under Dr Gearldine Shown care   All understand the overall limited prognosis, hope is for comfort and dignity.  All are  concerned with anticipatory care needs and financial burden of care.   Desire for further [employer] support:no/ family has strong community church support  3. Symptom Management:  Discussed with family utilization of medications  for anticipated symptoms including, mental status changes, behavioral changes, safety, seizure, pain.  Will need continued support and help navigating this difficult situation.  At this time patient is comfortable  5. Prognosis: < 6 months  5. Discharge Planning: Pending; family is  hopeful for SNF placement under  Medicare, SW still working on placement options.  Wife cannot continue to care for patient at home.   Care plan was discussed with Dr Sloan Leiter and patient's family  Thank you for allowing the  Palliative Medicine Team to assist in the care of this patient.   Time In: 0900 Time Out: 0925 Total Time 25 min Prolonged Time Billed  no     Greater than 50%  of this time was spent counseling and coordinating care related to the above assessment and plan.   Knox Royalty, NP  05/05/2015, 1:29 PM  Please contact Palliative Medicine Team phone at [phone] for questions and concerns.    Discussed with Dr Sloan Leiter

## 2015-05-05 NOTE — Care Management Note (Signed)
Case Management Note  Patient Details  Name: [name] MRN: [id number] Date of Birth: February 04, 1935  Subjective/Objective:   79 yo admitted with Glioblastoma                 Action/Plan: From home with wife. To DC to SNF  Expected Discharge Date:                  Expected Discharge Plan:  [place]  In-House Referral:  Clinical Social Work  Discharge planning Services  CM Consult  Post Acute Care Choice:    Choice offered to:     DME Arranged:    DME Agency:     HH Arranged:    [place] Agency:     Status of Service:  Completed, signed off  Medicare Important Message Given:  Yes Date Medicare IM Given:  05/05/15 Medicare IM give by:  Marney Doctor RN,BSN,NCM Date Additional Medicare IM Given:    Additional Medicare Important Message give by:     If discussed at [facility] of Stay Meetings, dates discussed:    Additional Comments:  Lynnell Catalan, RN 05/05/2015, 2:36 PM

## 2015-05-06 ENCOUNTER — Ambulatory Visit: Payer: Medicare Other | Admitting: Podiatry

## 2015-05-06 ENCOUNTER — Telehealth: Payer: Self-pay | Admitting: Oncology

## 2015-05-06 NOTE — Telephone Encounter (Signed)
Labs/chemo added every 2 wks per 05/31 POF, sent msg to add chemo and pt will get updated schedule at next visit.... KJ

## 2015-05-07 ENCOUNTER — Telehealth: Payer: Self-pay | Admitting: *Deleted

## 2015-05-07 NOTE — Telephone Encounter (Signed)
Message from pt's wife stating she does not want him to have Avastin. Instructed her to keep office visit as scheduled on 05/11/15. She voiced understanding. Pt is currently in a rehab facility, wife will arrange transportation through the facility.

## 2015-05-07 NOTE — Telephone Encounter (Signed)
PT.'S WIFE PER VOICE MAIL NO AVASTIN. DOES PT. NEED TO KEEP APPOINTMENT WITH DR.SHERRILL? VERBAL ORDER AND READ BACK TO DR.SHERRILL- BE SURE DO NOT WANT TO START AVASTIN. IF NO PT. DOES NOT HAVE TO KEEP APPOINTMENT. DOES FAMILY WANT A HOSPICE REFERRAL? RETURNED CALL TO PT.'S WIFE AND LEFT A MESSAGE ON MOBILE PHONE TO RETURN A CALL TO THIS OFFICE.

## 2015-05-07 NOTE — Telephone Encounter (Addendum)
Left message on voicemail for pt's wife to clarify earlier instructions. OK to cancel office visit if she wishes. Need to know if family wants a referral to hospice. Will follow up on 05/08/15.

## 2015-05-07 NOTE — Telephone Encounter (Signed)
AT 11:26AM VOICE MAIL FROM PT.'S WIFE. RETURNED CALL AT 12:50PM LEFT MESSAGE ON PT.'S WIFE MOBILE PHONE NUMBER.

## 2015-05-07 NOTE — Telephone Encounter (Signed)
Per staff message and POF I have scheduled appts. Advised scheduler of appts. JMW  

## 2015-05-08 ENCOUNTER — Telehealth: Payer: Self-pay | Admitting: Oncology

## 2015-05-08 ENCOUNTER — Telehealth: Payer: Self-pay | Admitting: *Deleted

## 2015-05-08 NOTE — Telephone Encounter (Signed)
Lft msg for pt confirming labs/ov per 06/01 POF, sent msg to add chemo. Mailed schedule to pt... KJ

## 2015-05-08 NOTE — Telephone Encounter (Signed)
Pt's wife returned call, she plans to bring him on 6/6 for office visit.

## 2015-05-08 NOTE — Telephone Encounter (Signed)
Clarified plan with schedulers. Pt's wife is declining Avastin. Pt does not need lab or infusion appointments. Requested pt's wife return call to let us know if she wants pt to be seen in office on 6/6. Does she want a hospice referral?

## 2015-05-09 ENCOUNTER — Other Ambulatory Visit: Payer: Self-pay | Admitting: Oncology

## 2015-05-11 ENCOUNTER — Ambulatory Visit: Payer: Federal, State, Local not specified - PPO

## 2015-05-11 ENCOUNTER — Other Ambulatory Visit: Payer: Federal, State, Local not specified - PPO

## 2015-05-11 ENCOUNTER — Ambulatory Visit (HOSPITAL_BASED_OUTPATIENT_CLINIC_OR_DEPARTMENT_OTHER): Payer: Medicare Other | Admitting: Oncology

## 2015-05-11 ENCOUNTER — Telehealth: Payer: Self-pay | Admitting: Oncology

## 2015-05-11 ENCOUNTER — Encounter: Payer: Self-pay | Admitting: *Deleted

## 2015-05-11 VITALS — BP 123/79 | HR 81 | Temp 98.0°F | Resp 17 | Ht 74.0 in

## 2015-05-11 DIAGNOSIS — R410 Disorientation, unspecified: Secondary | ICD-10-CM

## 2015-05-11 DIAGNOSIS — C712 Malignant neoplasm of temporal lobe: Secondary | ICD-10-CM

## 2015-05-11 DIAGNOSIS — C719 Malignant neoplasm of brain, unspecified: Secondary | ICD-10-CM

## 2015-05-11 NOTE — Telephone Encounter (Signed)
Gave and printed appt sched and avs for pt for JUNE °

## 2015-05-11 NOTE — Progress Notes (Signed)
  [place] OFFICE PROGRESS NOTE   Diagnosis: Glioblastoma  INTERVAL HISTORY:   [name] returns as scheduled. He was discharged to a skilled nursing facility 05/05/2015. He presents today with his wife and son. They report there is been no improvement in his confusion since beginning Decadron. He was scheduled to begin Avastin today. The family has decided against Avastin therapy.  Objective:  Vital signs in last 24 hours:  Blood pressure 123/79, pulse 81, temperature 98 F (36.7 C), temperature source Oral, resp. rate 17, height $RemoveBe'6\' 2"'LtOmOVnhI$  (1.88 m), SpO2 97 %.    HEENT: No thrush Resp: Decreased breath sounds at the lower chest, no respiratory distress Cardio: Regular rate and rhythm GI: No hepatomegaly, nontender Vascular: Trace edema at the left greater than right lower leg with venous varicosities bilaterally Neuro: Alert, follows commands, moves all extremities, good arm and leg strength bilaterally. Not oriented.     Medications: I have reviewed the patient's current medications.  Assessment/Plan: 1. Glioblastoma Multiforme, status post resection of a right temporal mass 08/20/2014  MGMT methylation not detected  Negative for IDH1 and IDH2 mutations  Initiation of adjuvant temozolomide and radiation 09/11/2014, radiation completed 10/22/2014  Cycle 6 adjuvant temozolomide began 04/06/2015  MRI 05/01/2015 with significant tumor progression at the right temporal lobe with extensive vasogenic edema and subependymal spread to the left occipital horn  2. History of an altered mental status secondary to #1 3. History of coronary artery disease 4. Lower extremity varicosities 5. Sleep apnea 6. Right subdural hematoma noted on a brain MRI 01/29/2015, improved on the MRI 05/01/2015    Disposition:  [name] has a progressive glioblastoma. He remains markedly confused. I discussed the case with Dr. [name] last week. He does not recommend surgical  intervention. I reviewed treatment options with his family again today. They have decided against a trial of Avastin. We again reviewed the expected benefit and potential toxicities associated with Avastin.  We tapered the Decadron to 4 mg twice daily and discontinued nonessential medications. The family agrees to a [facility] hospice referral. We discussed CPR and ACLS issues. He will be placed on a no CODE BLUE status. [name] will return for an office visit in approximately 3 weeks. We are available to see him sooner as needed.  Betsy Coder, MD  05/11/2015  5:09 PM

## 2015-05-12 ENCOUNTER — Telehealth: Payer: Self-pay | Admitting: *Deleted

## 2015-05-12 NOTE — Telephone Encounter (Signed)
Per Dr. Benay Spice; notified [facility] for pt referral; pt NCB.  Office verbalized understanding.

## 2015-05-13 NOTE — ED Provider Notes (Signed)
CSN: [id number]     Arrival date & time 05/01/15  1054 History   First MD Initiated Contact with Patient 05/01/15 1139     Chief Complaint  Patient presents with  . Aphasia  . Gait Problem  . Altered Mental Status      HPI Family reports that pt has had slurred speech, unsteady gait, and confusion x 1 week. Pt has history of brain tumor and has been holding his head more often. Past Medical History  Diagnosis Date  . Coronary artery disease   . MI, old     INFERIOR  . Hypertension   . Hyperlipidemia   . Chronic venous insufficiency     and varicose veins  . Anterior myocardial infarction 1990    with subsequent PTCA  . Hypercholesterolemia   . Ulcer of right leg   . Obstructive sleep apnea   . Anemia   . Obesity   . Radiation 09/11/14-10/22/14    right temperol lobe 60 gray  . Brain tumor 08/2014   Past Surgical History  Procedure Laterality Date  . Cardiac catheterization  06/30/2003    EF 50%  . Coronary artery bypass graft  01/2003    X6. LIMA GRAFT TO THE LDA, FREE RADICAL GRAFT TO THE OM1, SEQUENTIAL SAPHENOUS VEIN GRAFT TO THE FIRST AND SECOND DIAGONAL BRANCES, AND A SEQUENTIAL  VEIN GRAFT TO THE PDA AND POSTERIOR LATERAL BRANCHES OF THE RIGHT CORONARY  . Coronary angioplasty  06/2003    POSTERIOR LATERAL BRANCH  . Tonsillectomy and adenoidectomy    . Cardiovascular stress test  01/23/2009    EF 44%  . Craniotomy Right 08/20/2014    Procedure: CRANIOTOMY TUMOR EXCISION w/ BrainLab;  Surgeon: Newman Pies, MD;  Location: [facility];  Service: Neurosurgery;  Laterality: Right;  Right Craniotomy with brain lab for tumor   Family History  Problem Relation Age of Onset  . Cancer Sister   . Heart disease    . Diabetes Brother    History  Substance Use Topics  . Smoking status: Never Smoker   . Smokeless tobacco: Never Used  . Alcohol Use: No    Review of Systems  Neurological: Positive for dizziness, speech difficulty and headaches.  All other systems  reviewed and are negative.     Allergies  Review of patient's allergies indicates no known allergies.  Home Medications   Prior to Admission medications   Medication Sig Start Date End Date Taking? Authorizing Provider  aspirin 81 MG tablet Take 81 mg by mouth daily.   Yes Historical Provider, MD  mupirocin ointment (BACTROBAN) 2 % Apply 1 application topically daily as needed. For cuts/sores 03/04/15  Yes Historical Provider, MD  nitroGLYCERIN (NITROSTAT) 0.4 MG SL tablet Place 1 tablet (0.4 mg total) under the tongue every 5 (five) minutes as needed. 12/26/12  Yes Peter M Martinique, MD  ondansetron [facility]) 8 MG tablet Take one tablet 1 hour prior to Temodar dose and every 12 hours if needed for nausea 02/05/15  Yes Ladell Pier, MD  polyethylene glycol [facility] / GLYCOLAX) packet Take 17 g by mouth daily as needed for mild constipation.    Yes Historical Provider, MD  sulfamethoxazole-trimethoprim (BACTRIM DS,SEPTRA DS) 800-160 MG per tablet Take 1 tablet by mouth every Monday, Wednesday, and Friday. For 3 months 02/05/15  Yes Ladell Pier, MD  amLODipine (NORVASC) 5 MG tablet Take 1 tablet (5 mg total) by mouth daily. 05/05/15   Shanker Kristeen Mans, MD  dexamethasone (  DECADRON) 4 MG tablet Take 2 tablets (8 mg total) by mouth 2 (two) times daily. Patient taking differently: Take 4 mg by mouth 2 (two) times daily.  05/05/15   Shanker Kristeen Mans, MD  LORazepam (ATIVAN) 1 MG tablet Take 1 tablet (1 mg total) by mouth every 6 (six) hours as needed for anxiety or sedation. Patient taking differently: Take 1 mg by mouth at bedtime.  05/05/15   Shanker Kristeen Mans, MD  pantoprazole (PROTONIX) 40 MG tablet Take 1 tablet (40 mg total) by mouth daily. 05/05/15   Shanker Kristeen Mans, MD   BP 112/63 mmHg  Pulse 63  Temp(Src) 98.4 F (36.9 C) (Oral)  Resp 18  Ht 6\' 2"  (1.88 m)  Wt 230 lb 6.1 oz (104.5 kg)  BMI 29.57 kg/m2  SpO2 99% Physical Exam  Constitutional: He is oriented to person, place, and time.  He appears well-developed and well-nourished. No distress.  HENT:  Head: Normocephalic and atraumatic.  Eyes: Pupils are equal, round, and reactive to light.  Neck: Normal range of motion.  Cardiovascular: Normal rate and intact distal pulses.   Pulmonary/Chest: No respiratory distress.  Abdominal: Normal appearance. He exhibits no distension.  Musculoskeletal: Normal range of motion.  Neurological: He is alert and oriented to person, place, and time. No cranial nerve deficit.  Skin: Skin is warm and dry. No rash noted.  Psychiatric: He has a normal mood and affect. His behavior is normal.  Nursing note and vitals reviewed.   ED Course  Procedures (including critical care time) Labs Review Labs Reviewed  CBC - Abnormal; Notable for the following:    HCT 38.7 (*)    All other components within normal limits  DIFFERENTIAL - Abnormal; Notable for the following:    Lymphocytes Relative 10 (*)    All other components within normal limits  COMPREHENSIVE METABOLIC PANEL - Abnormal; Notable for the following:    Glucose, Bld 104 (*)    Total Protein 5.9 (*)    ALT 10 (*)    GFR calc non Af Amer 54 (*)    All other components within normal limits  COMPREHENSIVE METABOLIC PANEL - Abnormal; Notable for the following:    Sodium 134 (*)    CO2 21 (*)    Glucose, Bld 182 (*)    Calcium 8.8 (*)    Total Protein 5.6 (*)    Albumin 3.4 (*)    ALT 13 (*)    All other components within normal limits  CBC - Abnormal; Notable for the following:    RBC 4.20 (*)    Hemoglobin 12.3 (*)    HCT 37.0 (*)    All other components within normal limits  CBC - Abnormal; Notable for the following:    WBC 17.4 (*)    All other components within normal limits  BASIC METABOLIC PANEL - Abnormal; Notable for the following:    Glucose, Bld 135 (*)    All other components within normal limits  I-STAT CHEM 8, ED - Abnormal; Notable for the following:    Glucose, Bld 101 (*)    All other components within  normal limits  URINALYSIS, ROUTINE W REFLEX MICROSCOPIC (NOT AT [facility])  I-STAT TROPOININ, ED    Imaging Review No results found.  MR Brain W Wo Contrast (Final result) Result time: 05/01/15 14:01:09   Procedure changed from MR Brain Wo Contrast      Final result by Rad Results In Interface (05/01/15 14:01:09)   Narrative:  CLINICAL DATA: Slurred speech, confusion, and unsteady gait for 1 week. History of GBM.  EXAM: MRI HEAD WITHOUT AND WITH CONTRAST  TECHNIQUE: Multiplanar, multiecho pulse sequences of the brain and surrounding structures were obtained without and with intravenous contrast.  CONTRAST: MultiHance 20 mL.  COMPARISON: 01/29/2015 most recent. Preoperative scan 08/19/2014.  FINDINGS: The patient is status post craniotomy for subtotal glioblastoma multiforme removal.  There is significant tumor recurrence now identified at the surgical site. Previously identified only slightly enhancing tumor bad now displays marked increase in size as well as irregular thick-walled enhancement area of tumor recurrence which is accompanied by necrosis/ encephalomalacia centrally is 45 x 63 x 40 mm. Enhancement extends to the lateral margin of the temporal horn. In addition, there is subependymal spread of tumor to the contralateral LEFT occipital horn. Marked surrounding vasogenic edema extends throughout the entire RIGHT hemisphere, and results and RIGHT to LEFT shift of approximately 5 mm.  Generalized atrophy. Minor white matter disease. Previously identified RIGHT frontal extra-axial fluid collection has shown significant improvement since the previous exam, now measuring 8 mm thick as opposed to 19 mm thick previously. Only minor mass effect on the RIGHT frontal cortex, not felt to be significant given the degree of atrophy.  IMPRESSION: Significant tumor recurrence now identified at the site of previous RIGHT temporal lobectomy. This measures 45 x 63 x 40  mm. Extensive vasogenic edema throughout the RIGHT hemisphere with 5 mm right-to-left shift.  Subependymal spread of tumor to the contralateral LEFT occipital horn.  Improved right-sided subdural hematoma. No significant mass effect at this time.   Electronically Signed By: Rolla Flatten M.D. On: 05/01/2015 14:01         MDM   Final diagnoses:  GBM (glioblastoma multiforme)  Glioblastoma        Leonard Schwartz, MD 05/13/15 1731

## 2015-05-15 ENCOUNTER — Telehealth: Payer: Self-pay | Admitting: *Deleted

## 2015-05-15 NOTE — Telephone Encounter (Signed)
[name] from [facility] reports that pt will be discharged from [name] in approximately 7 days then hospice will contact pt and set-up home visit.  Note to Dr. Benay Spice.

## 2015-05-21 ENCOUNTER — Telehealth: Payer: Self-pay | Admitting: *Deleted

## 2015-05-21 NOTE — Telephone Encounter (Signed)
Call from [place], [facility] Admissions reporting pt will be admitted to hospice on 6/18 when discharged from [facility]. They have orders from 05/12/15.

## 2015-05-25 ENCOUNTER — Other Ambulatory Visit: Payer: Federal, State, Local not specified - PPO

## 2015-05-25 ENCOUNTER — Ambulatory Visit: Payer: Federal, State, Local not specified - PPO

## 2015-05-26 ENCOUNTER — Telehealth: Payer: Self-pay | Admitting: *Deleted

## 2015-05-26 NOTE — Telephone Encounter (Signed)
Received call from [place] at [facility]; states patient wishes to be DNR, hospice physician is taking care of this. DNR will be signed and placed in the pt's home.

## 2015-06-01 ENCOUNTER — Encounter: Payer: Self-pay | Admitting: Podiatry

## 2015-06-01 ENCOUNTER — Ambulatory Visit (INDEPENDENT_AMBULATORY_CARE_PROVIDER_SITE_OTHER): Admitting: Podiatry

## 2015-06-01 DIAGNOSIS — B351 Tinea unguium: Secondary | ICD-10-CM | POA: Diagnosis not present

## 2015-06-01 NOTE — Progress Notes (Signed)
Subjective: 80 year old male presents accompanied by his wife requesting toe nails trimmed. Currently undergoing cancer treatment.  Nails are deformed and painful.  Objective: Pale countenance with good spirit.   Thick dystrophic nails x 10. Neurovascular status are within normal. Rectus foot without gross deformity.  Assessment: Hypertrophic mycotic nails x 10.  Plan: All nails debrided.

## 2015-06-01 NOTE — Patient Instructions (Signed)
Seen for hypertrophic nails. All nails debrided. Return in 3 months or as needed.  

## 2015-06-02 ENCOUNTER — Telehealth: Payer: Self-pay | Admitting: Oncology

## 2015-06-02 ENCOUNTER — Ambulatory Visit (HOSPITAL_BASED_OUTPATIENT_CLINIC_OR_DEPARTMENT_OTHER): Payer: Medicare Other | Admitting: Nurse Practitioner

## 2015-06-02 VITALS — BP 140/81 | HR 67 | Temp 98.5°F | Resp 18 | Ht 74.0 in | Wt 236.4 lb

## 2015-06-02 DIAGNOSIS — C719 Malignant neoplasm of brain, unspecified: Secondary | ICD-10-CM

## 2015-06-02 DIAGNOSIS — G4700 Insomnia, unspecified: Secondary | ICD-10-CM

## 2015-06-02 DIAGNOSIS — I2510 Atherosclerotic heart disease of native coronary artery without angina pectoris: Secondary | ICD-10-CM

## 2015-06-02 DIAGNOSIS — C712 Malignant neoplasm of temporal lobe: Secondary | ICD-10-CM | POA: Diagnosis not present

## 2015-06-02 DIAGNOSIS — R4182 Altered mental status, unspecified: Secondary | ICD-10-CM

## 2015-06-02 NOTE — Telephone Encounter (Signed)
Gave adn printed appt sched and avs for pt for July  °

## 2015-06-02 NOTE — Progress Notes (Addendum)
  [place] OFFICE PROGRESS NOTE   Diagnosis: Glioblastoma   INTERVAL HISTORY:   Mr. Hacker returns as scheduled. He is accompanied by his wife and son. They report he is "wandering" during the night. A bed alarm was installed last night. He continues to be confused. He has a good appetite. No nausea or vomiting. No headaches. Current Decadron dose is 4 mg twice daily.  Objective:  Vital signs in last 24 hours:  Blood pressure 140/81, pulse 67, temperature 98.5 F (36.9 C), temperature source Oral, resp. rate 18, height _0  (1.88 m), weight 236 lb 6.4 oz (107.23 kg).    HEENT: No thrush or ulcers. Resp: Lungs clear bilaterally. Cardio: Regular rate and rhythm. GI: Abdomen soft and nontender. No hepatomegaly. Vascular: Trace to 1+ pitting edema at the lower legs bilaterally. Neuro: Alert. Confused. Follows commands.    Lab Results:  Lab Results  Component Value Date   WBC 17.4* 05/03/2015   HGB 14.4 05/03/2015   HCT 42.1 05/03/2015   MCV 87.2 05/03/2015   PLT 201 05/03/2015   NEUTROABS 5.1 05/01/2015    Imaging:  No results found.  Medications: I have reviewed the patient's current medications.  Assessment/Plan: 1. Glioblastoma Multiforme, status post resection of a right temporal mass 08/20/2014  MGMT methylation not detected  Negative for IDH1 and IDH2 mutations  Initiation of adjuvant temozolomide and radiation 09/11/2014, radiation completed 10/22/2014  Cycle 6 adjuvant temozolomide began 04/06/2015  MRI 05/01/2015 with significant tumor progression at the right temporal lobe with extensive vasogenic edema and subependymal spread to the left occipital horn  2. History of an altered mental status secondary to #1 3. History of coronary artery disease 4. Lower extremity varicosities 5. Sleep apnea 6. Right subdural hematoma noted on a brain MRI 01/29/2015, improved on the MRI 05/01/2015   Disposition: Mr. Sinning appears unchanged. He  is enrolled in the hospice program. He remains confused. He is having difficulty sleeping and has a markedly increased appetite. We are decreasing the Decadron to 4 mg every morning. He will continue Ativan as needed. He will return for a follow-up visit in approximately 4 weeks. He or his wife will contact the office in the interim with any problems.  Patient seen with Dr. Benay Spice.    Ned Card ANP/GNP-BC   06/02/2015  10:57 AM  This was a shared visit with Ned Card. We discussed the prognosis with his family. We recommended the family consider hiring a home care service to help at night.  Julieanne Manson, M.D.

## 2015-06-05 ENCOUNTER — Telehealth: Payer: Self-pay | Admitting: Physician Assistant

## 2015-06-05 MED ORDER — AMLODIPINE BESYLATE 5 MG PO TABS: 5.0000 mg | ORAL_TABLET | Freq: Every day | ORAL | Status: AC

## 2015-06-05 NOTE — Telephone Encounter (Addendum)
Patient's wife called after-hours service because patient needs refill of Norvasc. He was placed on this last month during admission - 5mg  daily. Rx'd by hospitalist and needs refills. Sent in to [facility] in [place] per her request. She verbalized understanding and gratitude. Dayna Dunn PA-C

## 2015-06-09 ENCOUNTER — Other Ambulatory Visit: Payer: Federal, State, Local not specified - PPO

## 2015-06-09 ENCOUNTER — Telehealth: Payer: Self-pay | Admitting: Neurology

## 2015-06-09 NOTE — Telephone Encounter (Signed)
Returned pt's wife's call. She said that the pt has 2 brain tumors that are inoperable and the patient does not want to use the cpap any longer. They tried to turn in the machine to [facility] but [facility] would not accept the machine back until they received a letter from Dr. Brett Fairy saying that he should not use the cpap machine. Informed pt that Dr. Brett Fairy is out of the office until next week and I will ask her then and call pt's wife.

## 2015-06-09 NOTE — Telephone Encounter (Addendum)
Patient's wife(nancy) called requesting a letter to be sent to [facility] stating he can not use CPAP machine anymore. He has 2 tumors that are in-operable. Please call and advise. She can be reached at [phone].

## 2015-06-15 ENCOUNTER — Other Ambulatory Visit: Payer: Self-pay

## 2015-06-15 ENCOUNTER — Telehealth: Payer: Self-pay

## 2015-06-15 ENCOUNTER — Other Ambulatory Visit: Payer: Self-pay | Admitting: *Deleted

## 2015-06-15 MED ORDER — PANTOPRAZOLE SODIUM 40 MG PO TBEC: 40.0000 mg | DELAYED_RELEASE_TABLET | Freq: Every day | ORAL | Status: AC

## 2015-06-15 NOTE — Telephone Encounter (Signed)
Spoke to Dr. Brett Fairy re: letter to [facility] so pt can stop using cpap due to 2 inoperable brain tumors. She agreed to send letter stating pt may turn in cpap. Letter sent to pt, and [place] notified.

## 2015-06-15 NOTE — Telephone Encounter (Signed)
Call from Catalina Lunger, RN with hospice requesting refill on Protonix. She states pt recently began Prilosec 40 mg in addition to Protonix. Asks if Dr. Benay Spice wants him to continue both?

## 2015-06-19 ENCOUNTER — Telehealth: Payer: Self-pay | Admitting: *Deleted

## 2015-06-19 NOTE — Telephone Encounter (Signed)
Late entry for 06/17/15: Message from Mariel Kansky with hospice reporting pt is being admitted to [facility] for respite care. Pt has been agitated and restless, plan is to discharge pt back home after a few days. Dr. Benay Spice made aware.

## 2015-06-22 ENCOUNTER — Telehealth: Payer: Self-pay | Admitting: Oncology

## 2015-06-22 ENCOUNTER — Other Ambulatory Visit: Payer: Federal, State, Local not specified - PPO

## 2015-06-22 NOTE — Telephone Encounter (Signed)
Pt's wife called to cancel apt with MD due to pt is at [place]..... KJ

## 2015-06-30 ENCOUNTER — Ambulatory Visit: Payer: Medicare Other | Admitting: Oncology

## 2015-07-07 ENCOUNTER — Telehealth: Payer: Self-pay | Admitting: *Deleted

## 2015-07-07 ENCOUNTER — Telehealth: Payer: Self-pay | Admitting: Oncology

## 2015-07-07 NOTE — Telephone Encounter (Signed)
Late entry for 07/19/2015: Received message from [place], [place] with hospice. Pt expired July 19, 2015 at [place]. Dr. Benay Spice notified.

## 2015-07-07 NOTE — Telephone Encounter (Signed)
Received death cert. 07/07/15

## 2015-08-06 DEATH — deceased

## 2015-09-01 ENCOUNTER — Ambulatory Visit: Payer: Medicare Other | Admitting: Podiatry

## 2016-06-05 IMAGING — MR MR HEAD WO/W CM
7 of 11 series · 24 of 48 positions shown · IV contrast (multihance)
Comparison: 08/19/2014.

CLINICAL DATA: 79-year-old male post intracranial tumor resection.
Subsequent encounter.

EXAM:
MRI HEAD WITHOUT AND WITH CONTRAST
TECHNIQUE: Multiplanar, multiecho pulse sequences of the brain and surrounding
structures were obtained without and with intravenous contrast.
CONTRAST:  20mL MULTIHANCE GADOBENATE DIMEGLUMINE 529 MG/ML IV SOLN

[Series 2: FLAIR · sagittal · 3.0mm · 0.47mm/px · 4 of 43 slices shown (1 of 3)]
[im 1/43]
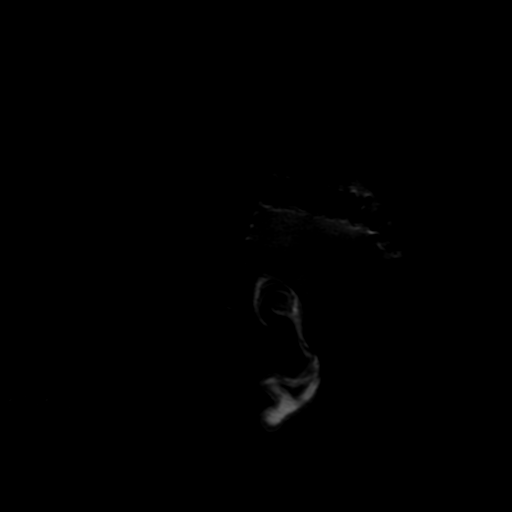
[im 15/43]
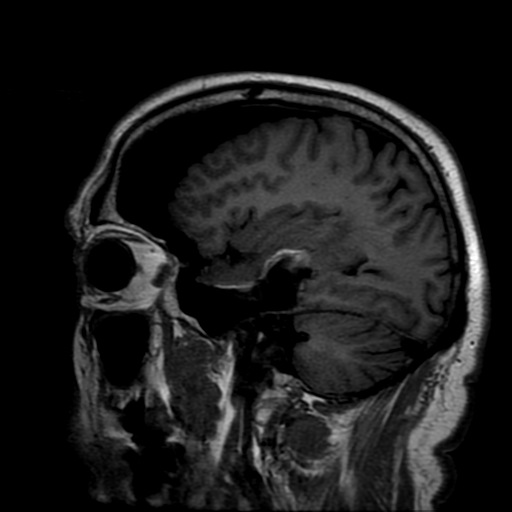
[im 29/43]
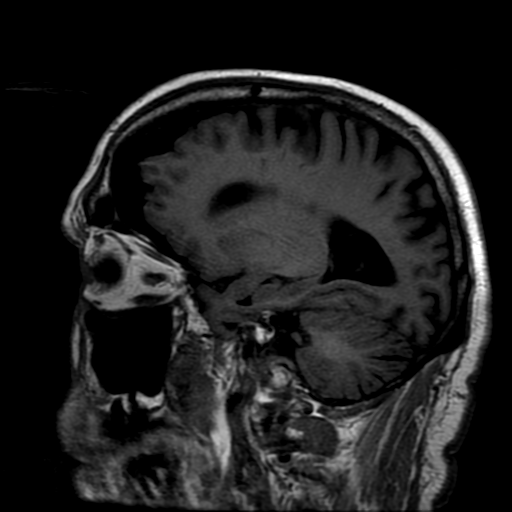
[im 43/43]
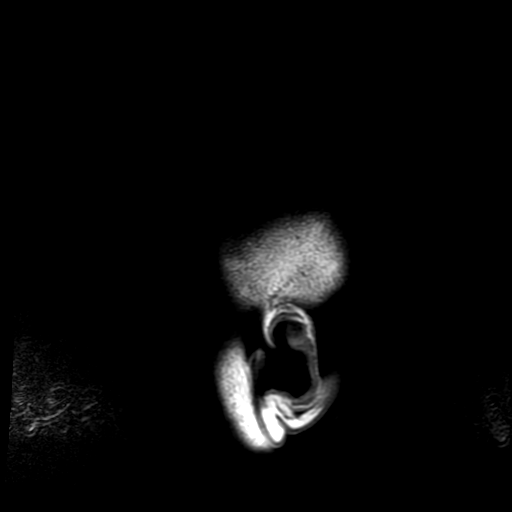

[Series 4: DWI · axial · 5.0mm · 1.09mm/px · z∈[-14,+124]mm · 6 of 56 slices shown (1 of 2)]
[im 1/56]
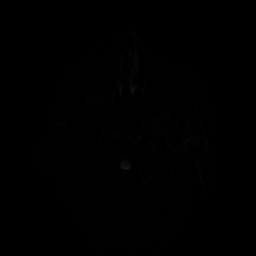
[im 12/56]
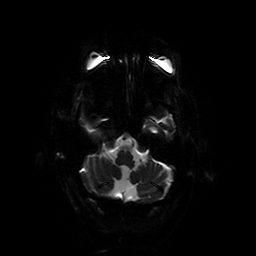
[im 23/56]
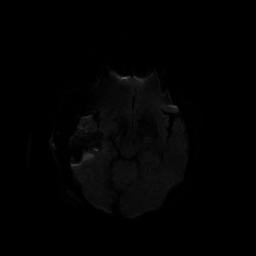
[im 34/56]
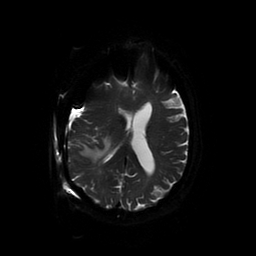
[im 45/56]
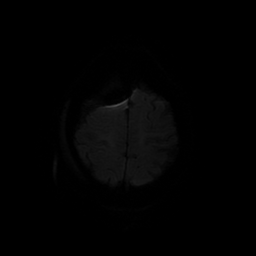
[im 56/56]
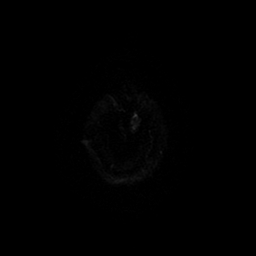

[Series 5: T2 · axial · 5.0mm · 0.45mm/px · z∈[-24,+115]mm · 3 of 26 slices shown (1 of 2)]
[im 1/26]
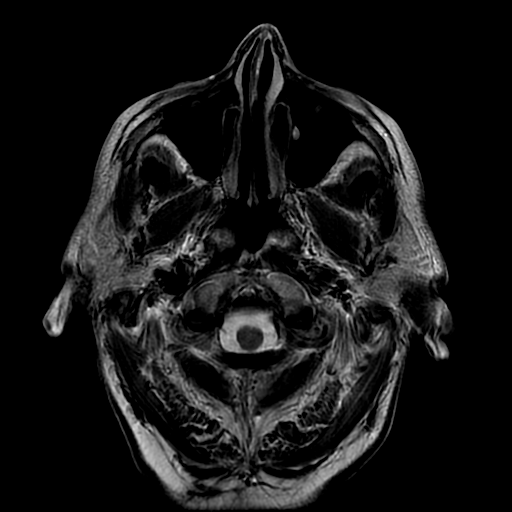
[im 13/26]
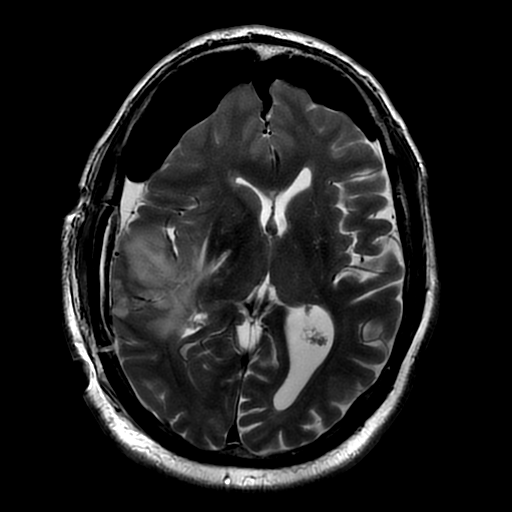
[im 26/26]
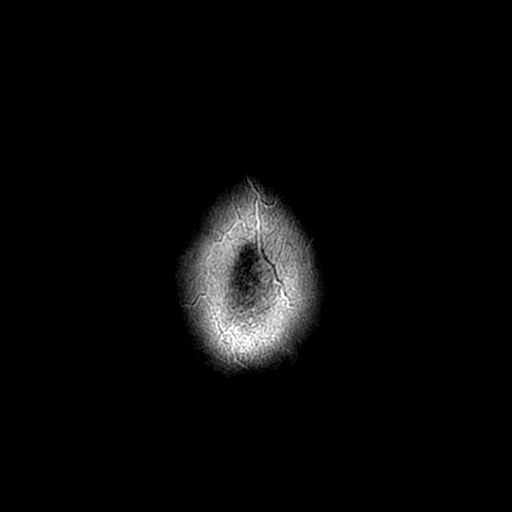

[Series 7: FLAIR · axial · 5.0mm · 0.45mm/px · z∈[-24,+115]mm · 3 of 26 slices shown (2 of 3)]
[im 1/26]
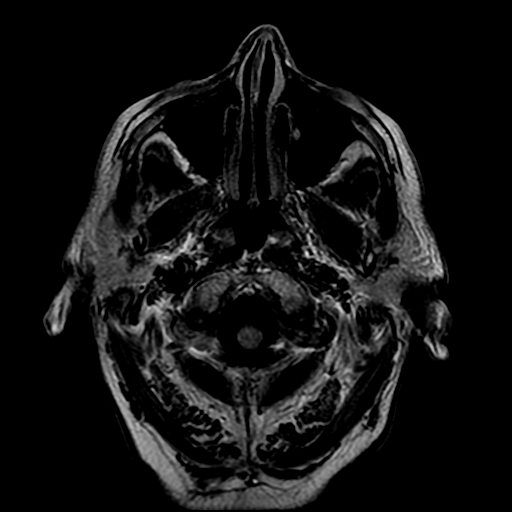
[im 13/26]
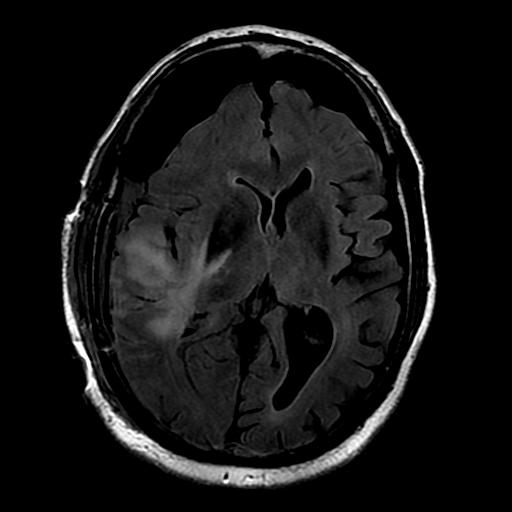
[im 26/26]
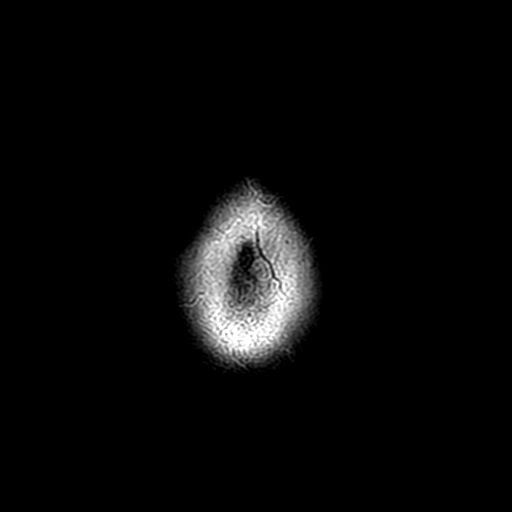

[Series 9: T2 · coronal · 5.0mm · 0.43mm/px · 1 of 31 slices shown (2 of 2)]
[im 1/31]
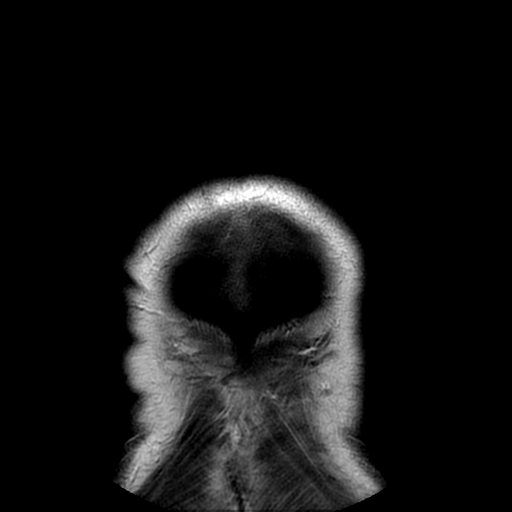

[Series 12: FLAIR · sagittal · 3.0mm · 0.47mm/px · 4 of 43 slices shown (3 of 3)]
[im 1/43]
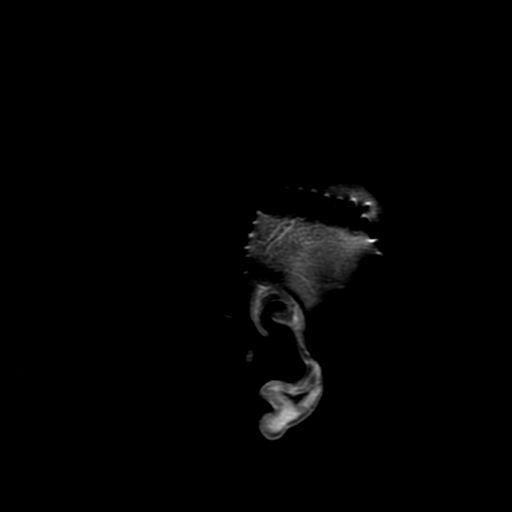
[im 15/43]
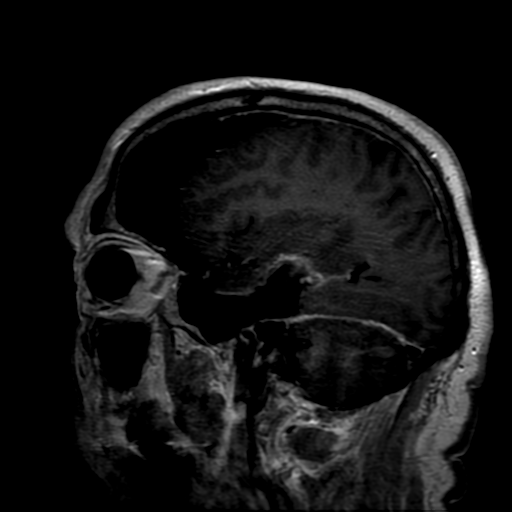
[im 29/43]
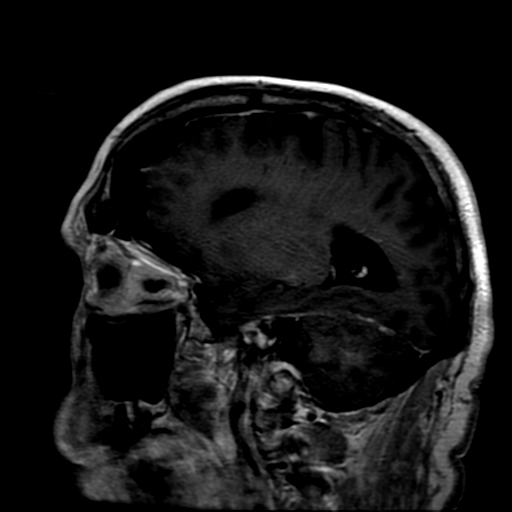
[im 43/43]
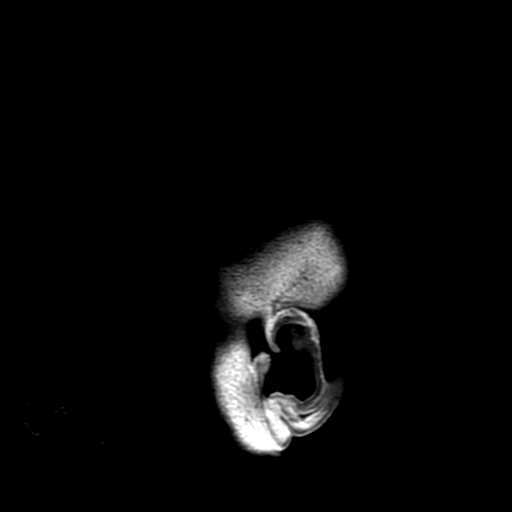

[Series 400: DWI · axial · 5.0mm · 1.09mm/px · z∈[-14,+124]mm · 3 of 28 slices shown (2 of 2)]
[im 1/28]
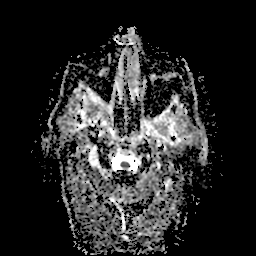
[im 14/28]
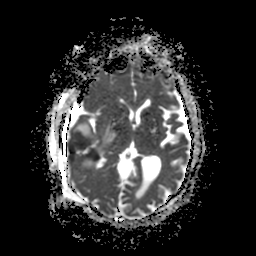
[im 28/28]
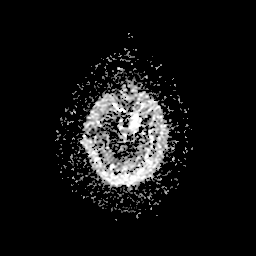

[24 of 48 positions shown; findings below may reference images not displayed]

FINDINGS: Post right frontal craniotomy for resection of large right temporal
lobe mass. Moderate pneumocephalus greater on the right with
displacement of the frontal lobes posteriorly and slightly to the
left.

In the operative cavity, fluid and small amount hemorrhage is noted.

Enhancement along the periphery of the operative site most notable
superior margin may be related to residual tumor.

Prominent surrounding T2/FLAIR altered signal intensity extends into
remainder of the temporal lobe, basal ganglia and right periatrial
region. This may represent vasogenic edema which will resolve
however, depending on the type tumor, this appearance may also
represent combination of tumor cells and edema. The present
examination will represent the patient's first postoperative
baseline exam to which follow-up exam can be compared to determine
if this represents residual tumor.

Mass effect upon the left lateral ventricle. 1 cm midline shift to
the left. Mild prominence left lateral ventricle unchanged.

Small infarct along the superior posterior margin of the resection
site.
IMPRESSION: Post right frontal craniotomy for resection of large right temporal
lobe mass. Moderate pneumocephalus greater on the right with
displacement of the frontal lobes posteriorly and slightly to the
left.

Enhancement along the periphery of the operative site most notable
superior margin may be related to residual tumor.

Prominent surrounding T2/FLAIR altered signal intensity extends into
remainder of the temporal lobe, basal ganglia and right periatrial
region. This may represent vasogenic edema which will resolve
however, depending on the type tumor, this appearance may also
represent combination of tumor cells and edema. The present
examination will represent the patient's first postoperative
baseline exam to which follow-up exam can be compared to determine
if this represents residual tumor.

Mass effect upon the left lateral ventricle. 1 cm midline shift to
the left. Mild prominence left lateral ventricle unchanged.

Small infarct along the superior posterior margin of the resection
site.

These results will be called to the ordering clinician or
representative by the Radiologist Assistant, and communication
documented in the PACS or zVision Dashboard.
